# Patient Record
Sex: Female | Born: 1995 | Race: White | Hispanic: No | Marital: Married | Smoking: Never smoker
Health system: Southern US, Community
[De-identification: ages and names within clinical notes are randomized; demographics above are authoritative.]

## PROBLEM LIST (undated history)

## (undated) ENCOUNTER — Inpatient Hospital Stay (HOSPITAL_COMMUNITY): Payer: Self-pay

## (undated) ENCOUNTER — Inpatient Hospital Stay: Payer: Self-pay

## (undated) DIAGNOSIS — G90A Postural orthostatic tachycardia syndrome (POTS): Secondary | ICD-10-CM

## (undated) DIAGNOSIS — R87629 Unspecified abnormal cytological findings in specimens from vagina: Secondary | ICD-10-CM

## (undated) DIAGNOSIS — G901 Familial dysautonomia [Riley-Day]: Secondary | ICD-10-CM

## (undated) DIAGNOSIS — F419 Anxiety disorder, unspecified: Secondary | ICD-10-CM

## (undated) DIAGNOSIS — F329 Major depressive disorder, single episode, unspecified: Secondary | ICD-10-CM

## (undated) DIAGNOSIS — T7840XA Allergy, unspecified, initial encounter: Secondary | ICD-10-CM

## (undated) DIAGNOSIS — R51 Headache: Secondary | ICD-10-CM

## (undated) DIAGNOSIS — F909 Attention-deficit hyperactivity disorder, unspecified type: Secondary | ICD-10-CM

## (undated) DIAGNOSIS — D649 Anemia, unspecified: Secondary | ICD-10-CM

## (undated) DIAGNOSIS — F32A Depression, unspecified: Secondary | ICD-10-CM

## (undated) DIAGNOSIS — Q796 Ehlers-Danlos syndrome, unspecified: Secondary | ICD-10-CM

## (undated) HISTORY — DX: Anemia, unspecified: D64.9

## (undated) HISTORY — DX: Allergy, unspecified, initial encounter: T78.40XA

## (undated) HISTORY — PX: NO PAST SURGERIES: SHX2092

## (undated) HISTORY — DX: Unspecified abnormal cytological findings in specimens from vagina: R87.629

## (undated) HISTORY — DX: Headache: R51

## (undated) HISTORY — PX: WISDOM TOOTH EXTRACTION: SHX21

---
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
## 2016-05-06 ENCOUNTER — Ambulatory Visit: Payer: 59 | Admitting: Internal Medicine

## 2016-06-12 ENCOUNTER — Emergency Department: Payer: 59

## 2016-06-12 ENCOUNTER — Emergency Department
Admission: EM | Admit: 2016-06-12 | Discharge: 2016-06-12 | Disposition: A | Discharge status: Home / Self Care | Payer: 59 | Attending: Emergency Medicine | Admitting: Emergency Medicine

## 2016-06-12 DIAGNOSIS — Y92009 Unspecified place in unspecified non-institutional (private) residence as the place of occurrence of the external cause: Secondary | ICD-10-CM | POA: Insufficient documentation

## 2016-06-12 DIAGNOSIS — Y939 Activity, unspecified: Secondary | ICD-10-CM | POA: Diagnosis not present

## 2016-06-12 DIAGNOSIS — W1839XA Other fall on same level, initial encounter: Secondary | ICD-10-CM | POA: Diagnosis not present

## 2016-06-12 DIAGNOSIS — Y999 Unspecified external cause status: Secondary | ICD-10-CM | POA: Insufficient documentation

## 2016-06-12 DIAGNOSIS — S93491A Sprain of other ligament of right ankle, initial encounter: Secondary | ICD-10-CM

## 2016-06-12 DIAGNOSIS — R42 Dizziness and giddiness: Secondary | ICD-10-CM | POA: Insufficient documentation

## 2016-06-12 DIAGNOSIS — S99911A Unspecified injury of right ankle, initial encounter: Secondary | ICD-10-CM | POA: Diagnosis present

## 2016-06-12 DIAGNOSIS — R51 Headache: Secondary | ICD-10-CM

## 2016-06-12 DIAGNOSIS — I10 Essential (primary) hypertension: Secondary | ICD-10-CM | POA: Insufficient documentation

## 2016-06-12 DIAGNOSIS — R519 Headache, unspecified: Secondary | ICD-10-CM

## 2016-06-12 LAB — POC URINE PREG, ED: Preg Test, Ur: NEGATIVE

## 2016-06-12 NOTE — ED Triage Notes (Addendum)
Pt reports fell Monday night and right ankle is hurting.

## 2016-06-12 NOTE — ED Notes (Signed)
Patient ambulated to room with slight limp

## 2016-06-12 NOTE — ED Provider Notes (Signed)
[facility]-EMERGENCY DEPARTMENT Provider Note   CSN: [id number] Arrival date & time: 06/12/16  1721     History   Chief Complaint Chief Complaint  Patient presents with  . Ankle Pain    HPI Cassidy Myers is a 21 y.o. female presents to the emergency department with chief complaint of right ankle pain. Patient states she injured the ankle 4 days ago at home. Patient states she stood up from a lying position quickly, passed out and inverted her right ankle. Patient has a past medical history of dysautonomia and POTS disease, has had frequent episodes of syncope and near syncope. She has been evaluated in the emergency department, tests have been performed, she has appointment scheduled with her cardiologist. Patient states that she fell and injured her right ankle she has been ambulatory but with a significant limp. She's had significant swelling to right ankle. She denies any other injury throughout her body such as her knee, hip, back, does state that she has had a new moderate headache, 7 out of 10 since her fall. Her fall was witnessed, boyfriend is uncertain if she hit her head. She quickly came to after the fall and complained of a headache later on that day. No nausea or vomiting. She denies any chest pain, shortness of breath, dizziness. Patient states she only feels as if she is to pass out she stands quickly from a lying position. Patient has been taking Tylenol and ibuprofen for her headache and ankle pain which has been slightly alleviating the headache and ankle.   HPI  Past Medical History:  Diagnosis Date  . Allergy   . Dysautonomia   . Headache(784.0)   . Hypertension     Patient Active Problem List   Diagnosis Date Noted  . Right ankle pain 08/23/2013  . Depression 03/09/2013  . 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 Nonallopathic lesion of lumbosacral region 01/27/2013  . Subluxation of peroneal tendon of right foot 01/18/2013  . Hypermobility syndrome  01/18/2013    Past Surgical History:  Procedure Laterality Date  . NO PAST SURGERIES      OB History    No data available       Home Medications    Prior to Admission medications   Medication Sig Start Date End Date Taking? Authorizing Provider  cyanocobalamin 100 MCG tablet Take 100 mcg by mouth daily.    Historical Provider, MD  Ginger, Zingiber officinalis, (GINGER PO) Take 1 capsule by mouth daily.    Historical Provider, MD  ibuprofen (ADVIL,MOTRIN) 600 MG tablet Take 1 tablet (600 mg total) by mouth every 6 (six) hours as needed for pain. 02/01/13   Marcellina Millin, MD  Norgestimate-Ethinyl Estradiol Triphasic 0.18/0.215/0.25 MG-25 MCG tab Take 1 tablet by mouth daily. 03/29/15   Historical Provider, MD  ondansetron (ZOFRAN) 4 MG tablet Take one tablet (4 mg) one tablet by mouth with meals as needed for nausea 07/08/15   Duke Salvia, MD  sertraline (ZOLOFT) 25 MG tablet Take 25 mg by mouth daily. 03/29/15   Historical Provider, MD  Zinc Sulfate (ZINC 15 PO) Take 1 capsule by mouth daily.    Historical Provider, MD    Family History Family History  Problem Relation Age of Onset  . Cancer Mother     Cervical  . Asthma Mother   . Depression Mother   . Hyperlipidemia Father   . Depression Father   . Depression Maternal Grandmother   . Depression Maternal Grandfather   .  Depression Paternal Grandmother   . Diabetes Paternal Grandmother   . Depression Paternal Grandfather   . Diabetes Paternal Grandfather     Social History Social History  Substance Use Topics  . Smoking status: Never Smoker  . Smokeless tobacco: Never Used  . Alcohol use No     Allergies   Sulfa antibiotics   Review of Systems Review of Systems  Constitutional: Negative for activity change, chills, fatigue and fever.  HENT: Negative for congestion, sinus pressure and sore throat.   Eyes: Negative for visual disturbance.  Respiratory: Negative for cough, chest tightness and shortness of  breath.   Cardiovascular: Negative for chest pain and leg swelling.  Gastrointestinal: Negative for abdominal pain, diarrhea, nausea and vomiting.  Genitourinary: Negative for dysuria.  Musculoskeletal: Positive for arthralgias, gait problem and joint swelling.  Skin: Negative for rash.  Neurological: Positive for dizziness (not currently experiencing any symptoms, her baseline symptoms are only present after standing quickly from a sitting or lying position.) and headaches. Negative for weakness and numbness.  Hematological: Negative for adenopathy.  Psychiatric/Behavioral: Negative for agitation, behavioral problems and confusion.     Physical Exam Updated Vital Signs BP (!) 116/59 (BP Location: Left Arm)   Pulse 62   Temp 98 F (36.7 C) (Oral)   Resp 18   Ht 5' 4.5" (1.638 m)   Wt 59 kg   LMP 06/09/2016   SpO2 98%   BMI 21.97 kg/m   Physical Exam  Constitutional: She appears well-developed and well-nourished. No distress.  HENT:  Head: Normocephalic and atraumatic.  Right Ear: External ear normal.  Left Ear: External ear normal.  Eyes: Conjunctivae and EOM are normal. Pupils are equal, round, and reactive to light.  Neck: Normal range of motion. Neck supple.  Cardiovascular: Normal rate and regular rhythm.   No murmur heard. Pulmonary/Chest: Effort normal and breath sounds normal. No respiratory distress. She has no wheezes. She has no rales.  Abdominal: Soft. There is no tenderness.  Musculoskeletal: She exhibits no edema.  Examination of the cervical thoracic and lumbar spine shows no spinous process tenderness. She has full range of motion of the hips knees and left ankle. Right ankle is slightly limited range of motion with moderate swelling over the ATFL ligament. She is tender to palpation along the distal fibula and ATFL ligament. No skin breakdown noted. No edema. Chest 2+ dorsalis pedis pulse. Achilles is palpated with no signs of tear. No calcaneal tenderness. No  fifth metatarsal tenderness.  Neurological: She is alert. She displays normal reflexes. No cranial nerve deficit or sensory deficit. She exhibits normal muscle tone. Coordination normal.  Skin: Skin is warm and dry.  Psychiatric: She has a normal mood and affect.  Nursing note and vitals reviewed.    ED Treatments / Results  Labs (all labs ordered are listed, but only abnormal results are displayed) Labs Reviewed  POC URINE PREG, ED    EKG  EKG Interpretation None       Radiology Dg Ankle Complete Right  Result Date: 06/12/2016 CLINICAL DATA:  Rolled right ankle, with worsening right ankle pain, acute onset. Initial encounter. EXAM: RIGHT ANKLE - COMPLETE 3+ VIEW COMPARISON:  None. FINDINGS: There is no evidence of fracture or dislocation. The ankle mortise is intact; the interosseous space is within normal limits. No talar tilt or subluxation is seen. The joint spaces are preserved. No significant soft tissue abnormalities are seen. IMPRESSION: No evidence of fracture or dislocation. Electronically Signed   By:  Roanna RaiderJeffery  Chang M.D.   On: 06/12/2016 18:18   Ct Head Wo Contrast  Result Date: 06/12/2016 CLINICAL DATA:  Status post fall, with headache, acute onset. Initial encounter. EXAM: CT HEAD WITHOUT CONTRAST TECHNIQUE: Contiguous axial images were obtained from the base of the skull through the vertex without intravenous contrast. COMPARISON:  None. FINDINGS: Brain: No evidence of acute infarction, hemorrhage, hydrocephalus, extra-axial collection or mass lesion/mass effect. The posterior fossa, including the cerebellum, brainstem and fourth ventricle, is within normal limits. The third and lateral ventricles, and basal ganglia are unremarkable in appearance. The cerebral hemispheres are symmetric in appearance, with normal gray-white differentiation. No mass effect or midline shift is seen. Vascular: No hyperdense vessel or unexpected calcification. Skull: There is no evidence of  fracture; visualized osseous structures are unremarkable in appearance. Sinuses/Orbits: The visualized portions of the orbits are within normal limits. The paranasal sinuses and mastoid air cells are well-aerated. Other: No significant soft tissue abnormalities are seen. IMPRESSION: No evidence of traumatic intracranial injury or fracture. Electronically Signed   By: Roanna RaiderJeffery  Chang M.D.   On: 06/12/2016 18:19    Procedures Procedures (including critical care time) SPLINT APPLICATION Date/Time: 8:01 PM Authorized by: Patience MuscaGAINES, THOMAS CHRISTOPHER Consent: Verbal consent obtained. Risks and benefits: risks, benefits and alternatives were discussed Consent given by: patient Splint applied by: ED tech Location details: Right ankle   Splint type: Prefabricated ankle splint  Supplies used: Ankle stirrup prefabricated, crutches  Post-procedure: The splinted body part was neurovascularly unchanged following the procedure. Patient tolerance: Patient tolerated the procedure well with no immediate complications.     Medications Ordered in ED Medications - No data to display   Initial Impression / Assessment and Plan / ED Course  I have reviewed the triage vital signs and the nursing notes.  Pertinent labs & imaging results that were available during my care of the patient were reviewed by me and considered in my medical decision making (see chart for details).     21 year old female with past medical history of dysautonomia and pots disease. Patient fell 4 days ago after standing quickly from a lying position, inverted her right ankle and thinks she may have hit her head. She's had new headache over the last 4 days. CT of the head and x-ray of the right ankle are negative. She will continue Tylenol and ibuprofen. Chest appointment to follow-up with PCP and cardiologist . She is educated on signs and symptoms to return to the emergency department for.   Final Clinical Impressions(s) / ED Diagnoses    Final diagnoses:  Acute nonintractable headache, unspecified headache type  Sprain of anterior talofibular ligament of right ankle, initial encounter    New Prescriptions New Prescriptions   No medications on file     Evon Slackhomas C Gaines, PA-C 06/12/16 2004    Emily FilbertJonathan E Williams, MD 06/12/16 2139

## 2016-06-12 NOTE — Discharge Instructions (Signed)
Please wear ankle stirrup or other ankle support device for 6 weeks. Take Tylenol and ibuprofen as needed for pain. Use crutches as needed for ambulation for the next few days. Return to the ER for any worsening symptoms or urgent changes in her health. Follow-up with PCP in 2-3 days for recheck. Keep appointment with cardiologist.

## 2016-09-03 ENCOUNTER — Encounter: Payer: Self-pay | Admitting: Internal Medicine

## 2016-09-03 ENCOUNTER — Ambulatory Visit (INDEPENDENT_AMBULATORY_CARE_PROVIDER_SITE_OTHER): Payer: 59 | Admitting: Internal Medicine

## 2016-09-03 VITALS — BP 116/66 | HR 71 | Temp 98.0°F | Wt 140.5 lb

## 2016-09-03 DIAGNOSIS — L209 Atopic dermatitis, unspecified: Secondary | ICD-10-CM

## 2016-09-03 MED ORDER — TRIAMCINOLONE ACETONIDE 0.1 % EX CREA: 1.0000 "application " | TOPICAL_CREAM | Freq: Two times a day (BID) | CUTANEOUS | 0 refills | Status: DC

## 2016-09-03 NOTE — Patient Instructions (Signed)

## 2016-09-03 NOTE — Progress Notes (Signed)
Subjective:    Patient ID: Cassidy Myers, female    DOB: 1995/09/16, 21 y.o.   MRN: [id number]  HPI  Pt presents to the clinic today with c/o a rash. She reports she noticed this 1-2 months ago. She reports she has red spots that pop up intermittently. They can occur any where at any time. The rash is very itchy. She has tried Eucerin and eczema relief cream without any relief. She denies changes in soaps, lotions or detergents. No one around her has a similar rash.   Review of Systems      Past Medical History:  Diagnosis Date  . Allergy   . Dysautonomia   . Headache(784.0)   . Hypertension     Current Outpatient Prescriptions  Medication Sig Dispense Refill  . cyanocobalamin 100 MCG tablet Take 100 mcg by mouth daily.    . Ginger, Zingiber officinalis, (GINGER PO) Take 1 capsule by mouth daily.    Marland Kitchen ibuprofen (ADVIL,MOTRIN) 600 MG tablet Take 1 tablet (600 mg total) by mouth every 6 (six) hours as needed for pain. 30 tablet 0  . Norgestimate-Ethinyl Estradiol Triphasic 0.18/0.215/0.25 MG-25 MCG tab Take 1 tablet by mouth daily.  7  . ondansetron (ZOFRAN) 4 MG tablet Take one tablet (4 mg) one tablet by mouth with meals as needed for nausea 30 tablet 3  . sertraline (ZOLOFT) 25 MG tablet Take 25 mg by mouth daily.  3  . Zinc Sulfate (ZINC 15 PO) Take 1 capsule by mouth daily.     No current facility-administered medications for this visit.     Allergies  Allergen Reactions  . Sulfa Antibiotics Nausea And Vomiting    Family History  Problem Relation Age of Onset  . Cancer Mother     Cervical  . Asthma Mother   . Depression Mother   . Hyperlipidemia Father   . Depression Father   . Depression Maternal Grandmother   . Depression Maternal Grandfather   . Depression Paternal Grandmother   . Diabetes Paternal Grandmother   . Depression Paternal Grandfather   . Diabetes Paternal Grandfather     Social History   Social History  . Marital status: Single    Spouse  name: N/A  . Number of children: N/A  . Years of education: 9   Occupational History  . Student     Social History Main Topics  . Smoking status: Never Smoker  . Smokeless tobacco: Never Used  . Alcohol use No  . Drug use: No  . Sexual activity: Yes   Other Topics Concern  . Not on file   Social History Narrative   Regular exercise-yes   Caffeine Use-yes     Constitutional: Denies fever, malaise, fatigue, headache or abrupt weight changes.  Skin: Pt reports rash. Denies ulcercations.   No other specific complaints in a complete review of systems (except as listed in HPI above).  Objective:   Physical Exam   BP 116/66   Pulse 71   Temp 98 F (36.7 C) (Oral)   Wt 140 lb 8 oz (63.7 kg)   LMP 08/27/2016   SpO2 98%   BMI 23.74 kg/m  Wt Readings from Last 3 Encounters:  09/03/16 140 lb 8 oz (63.7 kg) (71 %, Z= 0.55)*  06/12/16 130 lb (59 kg) (56 %, Z= 0.14)*  12/30/15 130 lb (59 kg) (58 %, Z= 0.20)*   * Growth percentiles are based on CDC 2-20 Years data.    General: Appears her  stated age, in NAD. Skin: Scattered scaly lesions of various shapes and sizes noted on arms, chest, abdomen and neck.  BMET    Component Value Date/Time   NA 136 12/30/2015 2101   K 4.1 12/30/2015 2101   CL 107 12/30/2015 2101   CO2 25 12/30/2015 2101   GLUCOSE 91 12/30/2015 2101   BUN 9 12/30/2015 2101   CREATININE 0.58 12/30/2015 2101   CALCIUM 9.5 12/30/2015 2101   GFRNONAA >60 12/30/2015 2101   GFRAA >60 12/30/2015 2101    Lipid Panel  No results found for: CHOL, TRIG, HDL, CHOLHDL, VLDL, LDLCALC  CBC    Component Value Date/Time   WBC 7.2 12/30/2015 2101   RBC 4.20 12/30/2015 2101   HGB 13.0 12/30/2015 2101   HCT 38.0 12/30/2015 2101   PLT 243 12/30/2015 2101   MCV 90.5 12/30/2015 2101   MCH 31.0 12/30/2015 2101   MCHC 34.3 12/30/2015 2101   RDW 12.5 12/30/2015 2101   LYMPHSABS 1.9 01/30/2014 1108   MONOABS 0.6 01/30/2014 1108   EOSABS 0.1 01/30/2014 1108    BASOSABS 0.0 01/30/2014 1108    Hgb A1C No results found for: HGBA1C         Assessment & Plan:   Atopic Dermatitis:  eRx for Triamcinolone cream to affected areas BID Continue Eucerin cream for now If no improvement, will refer to dermatology  RTC as needed or if symptoms persist or worsen BAITY, REGINA, NP

## 2016-09-30 ENCOUNTER — Emergency Department
Admission: EM | Admit: 2016-09-30 | Discharge: 2016-09-30 | Disposition: A | Discharge status: Home / Self Care | Payer: 59 | Attending: Emergency Medicine | Admitting: Emergency Medicine

## 2016-09-30 ENCOUNTER — Encounter: Payer: Self-pay | Admitting: Emergency Medicine

## 2016-09-30 DIAGNOSIS — R197 Diarrhea, unspecified: Secondary | ICD-10-CM | POA: Diagnosis not present

## 2016-09-30 DIAGNOSIS — R55 Syncope and collapse: Secondary | ICD-10-CM | POA: Diagnosis not present

## 2016-09-30 DIAGNOSIS — R112 Nausea with vomiting, unspecified: Secondary | ICD-10-CM | POA: Insufficient documentation

## 2016-09-30 LAB — URINALYSIS, COMPLETE (UACMP) WITH MICROSCOPIC
Bacteria, UA: NONE SEEN
Bilirubin Urine: NEGATIVE
Glucose, UA: NEGATIVE mg/dL
Hgb urine dipstick: NEGATIVE
Ketones, ur: NEGATIVE mg/dL
Leukocytes, UA: NEGATIVE
Nitrite: NEGATIVE
Protein, ur: NEGATIVE mg/dL
Specific Gravity, Urine: 1.021 (ref 1.005–1.030)
pH: 6 (ref 5.0–8.0)

## 2016-09-30 LAB — COMPREHENSIVE METABOLIC PANEL
ALT: 18 U/L (ref 14–54)
AST: 22 U/L (ref 15–41)
Albumin: 4.1 g/dL (ref 3.5–5.0)
Alkaline Phosphatase: 57 U/L (ref 38–126)
Anion gap: 6 (ref 5–15)
BUN: 13 mg/dL (ref 6–20)
CO2: 27 mmol/L (ref 22–32)
Calcium: 9.2 mg/dL (ref 8.9–10.3)
Chloride: 105 mmol/L (ref 101–111)
Creatinine, Ser: 0.67 mg/dL (ref 0.44–1.00)
GFR calc Af Amer: >60 mL/min (ref >60)
GFR calc non Af Amer: >60 mL/min (ref >60)
Glucose, Bld: 99 mg/dL (ref 65–99)
Potassium: 4.1 mmol/L (ref 3.5–5.1)
Sodium: 138 mmol/L (ref 135–145)
Total Bilirubin: 0.7 mg/dL (ref 0.3–1.2)
Total Protein: 7.4 g/dL (ref 6.5–8.1)

## 2016-09-30 LAB — CBC
HCT: 38.8 % (ref 35.0–47.0)
Hemoglobin: 13.4 g/dL (ref 12.0–16.0)
MCH: 30.7 pg (ref 26.0–34.0)
MCHC: 34.5 g/dL (ref 32.0–36.0)
MCV: 89 fL (ref 80.0–100.0)
Platelets: 258 10*3/uL (ref 150–440)
RBC: 4.36 MIL/uL (ref 3.80–5.20)
RDW: 12.6 % (ref 11.5–14.5)
WBC: 7.4 10*3/uL (ref 3.6–11.0)

## 2016-09-30 LAB — CHLAMYDIA/NGC RT PCR (ARMC ONLY)
Chlamydia Tr: NOT DETECTED
N gonorrhoeae: NOT DETECTED

## 2016-09-30 LAB — WET PREP, GENITAL
Clue Cells Wet Prep HPF POC: NONE SEEN
Sperm: NONE SEEN
Trich, Wet Prep: NONE SEEN
Yeast Wet Prep HPF POC: NONE SEEN

## 2016-09-30 LAB — LIPASE, BLOOD: Lipase: 24 U/L (ref 11–51)

## 2016-09-30 LAB — POCT PREGNANCY, URINE: Preg Test, Ur: NEGATIVE

## 2016-09-30 MED ORDER — ONDANSETRON HCL 4 MG/2ML IJ SOLN
4.0000 mg | Freq: Once | INTRAMUSCULAR | Status: AC
  Administered 2016-09-30: 4 mg via INTRAVENOUS
  Filled 2016-09-30: qty 2

## 2016-09-30 MED ORDER — SODIUM CHLORIDE 0.9 % IV BOLUS (SEPSIS)
1000.0000 mL | Freq: Once | INTRAVENOUS | Status: AC
  Administered 2016-09-30: 1000 mL via INTRAVENOUS

## 2016-09-30 NOTE — ED Triage Notes (Signed)
Arrives today with c/o Nausea and Vomiting since Saturday.  Yesterday, low back started to hurt.  Patient also reports one syncopal episode on Monday.

## 2016-09-30 NOTE — ED Provider Notes (Signed)
[facility] Emergency Department Provider Note ____________________________________________   I have reviewed the triage vital signs and the triage nursing note.  HISTORY  Chief Complaint Emesis and Nausea   Historian Patient  HPI Cassidy Myers is a 21 y.o. female presenting today for nausea and several episodes of vomiting started on Saturday and ongoing. Mom and dad both had similar symptoms with diarrhea as well as fever that lasted about 24 hours,. This patient has not had a fever however symptoms are ongoing. She has had some mild diarrhea as well. No significant abdominal pain. She is having a small amount of vaginal discharge. Reports feeling lightheaded and passing out once on Monday.  Currently still feels a little bit nauseated and mild generalized weakness. She feels like she may be dehydrated.  Nothing is making it worse or better.  States it's been greater than one month without a period. Slight vaginal discharge without pelvic pain.    Past Medical History:  Diagnosis Date  . Allergy   . Dysautonomia   . Headache(784.0)   . Hypertension     Patient Active Problem List   Diagnosis Date Noted  . Depression 03/09/2013  . 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 NO PAST SURGERIES      Prior to Admission medications   Medication Sig Start Date End Date Taking? Authorizing Provider  Norgestimate-Ethinyl Estradiol Triphasic 0.18/0.215/0.25 MG-25 MCG tab Take 1 tablet by mouth daily. 03/29/15   [provider]  ondansetron (ZOFRAN) 4 MG tablet Take one tablet (4 mg) one tablet by mouth with meals as needed for nausea Patient not taking: Reported on 09/03/2016 07/08/15   Duke Salvia, MD  Probiotic Product (PROBIOTIC-10) CAPS Take by mouth.    [provider]  triamcinolone cream (KENALOG) 0.1 % Apply 1 application topically 2 (two) times daily. 09/03/16   Lorre Munroe,  NP    Allergies  Allergen Reactions  . Sulfa Antibiotics Nausea And Vomiting    Family History  Problem Relation Age of Onset  . Cancer Mother        Cervical  . Asthma Mother   . Depression Mother   . Hyperlipidemia Father   . Depression Father   . Depression Maternal Grandmother   . Depression Maternal Grandfather   . Depression Paternal Grandmother   . Diabetes Paternal Grandmother   . Depression Paternal Grandfather   . Diabetes Paternal Grandfather     Social History Social History  Substance Use Topics  . Smoking status: Never Smoker  . Smokeless tobacco: Never Used  . Alcohol use No    Review of Systems  Constitutional: Negative for fever. Eyes: Negative for visual changes. ENT: Negative for sore throat. Cardiovascular: Negative for chest pain. Respiratory: Negative for shortness of breath. Gastrointestinal: Negative for bloody stools. Genitourinary: Negative for dysuria.  Positive for mild vaginal discharge. Musculoskeletal: Negative for back pain. Skin: Negative for rash. Neurological: Negative for headache.  ____________________________________________   PHYSICAL EXAM:  VITAL SIGNS: ED Triage Vitals  Enc Vitals Group     BP 09/30/16 0942 109/65     Pulse Rate 09/30/16 0942 76     Resp 09/30/16 0942 16     Temp 09/30/16 0942 98.1 F (36.7 C)     Temp Source 09/30/16 0942 Oral     SpO2 09/30/16 0942 98 %     Weight 09/30/16 0942 145 lb (65.8 kg)     Height 09/30/16  1610 5\' 4"  (1.626 m)     Head Circumference --      Peak Flow --      Pain Score 09/30/16 0941 5     Pain Loc --      Pain Edu? --      Excl. in GC? --      Constitutional: Alert and oriented. Well appearing and in no distress. HEENT   Head: Normocephalic and atraumatic.      Eyes: Conjunctivae are normal. PER.      Ears:         Nose: No congestion/rhinnorhea.   Mouth/Throat: Mucous membranes are moist.   Neck: No stridor. Cardiovascular/Chest: Normal rate,  regular rhythm.  No murmurs, rubs, or gallops. Respiratory: Normal respiratory effort without tachypnea nor retractions. Breath sounds are clear and equal bilaterally. No wheezes/rales/rhonchi. Gastrointestinal: Soft. No distention, no guarding, no rebound. Nontender.  Genitourinary/rectal: Small amount of vaginal discharge, weight, no cervicitis or adnexal tenderness on bimanual exam. Musculoskeletal: Nontender with normal range of motion in all extremities. No joint effusions.  No lower extremity tenderness.  No edema. Neurologic:  Normal speech and language. No gross or focal neurologic deficits are appreciated. Skin:  Skin is warm, dry and intact. No rash noted. Psychiatric: Mood and affect are normal. Speech and behavior are normal. Patient exhibits appropriate insight and judgment.   ____________________________________________  LABS (pertinent positives/negatives)  Labs Reviewed  URINALYSIS, COMPLETE (UACMP) WITH MICROSCOPIC - Abnormal; Notable for the following:       Result Value   Color, Urine YELLOW (*)    APPearance HAZY (*)    Squamous Epithelial / LPF 6-30 (*)    All other components within normal limits  CHLAMYDIA/NGC RT PCR ([facility] ONLY)  WET PREP, GENITAL  LIPASE, BLOOD  COMPREHENSIVE METABOLIC PANEL  CBC  POC URINE PREG, ED  POCT PREGNANCY, URINE    ____________________________________________    EKG I, Governor Rooks, MD, the attending physician have personally viewed and interpreted all ECGs.  56 bpm. Normal sinus rhythm. Narrow QRS. Normal axis. Normal ST and T-wave ____________________________________________  RADIOLOGY All Xrays were viewed by me. Imaging interpreted by Radiologist.  None __________________________________________  PROCEDURES  Procedure(s) performed: None  Critical Care performed: None  ____________________________________________   ED COURSE / ASSESSMENT AND PLAN  Pertinent labs & imaging results that were  available during my care of the patient were reviewed by me and considered in my medical decision making (see chart for details).   Ms. Rhodes is overall well-appearing with stable vital signs. Abdomen is soft and nontender. She still reporting feeling weak and nauseated, and I did discuss IV fluid and nausea medication for symptomatic relief.  Laboratory studies are reassuring.  She's had a mild vaginal discharge and we discussed proceeding with pelvic exam, no sign of PID on exam or cervicitis.  I'm most suspicious of likely viral intestinal illness.      CONSULTATIONS:   None   Patient / Family / Caregiver informed of clinical course, medical decision-making process, and agree with plan.   I discussed return precautions, follow-up instructions, and discharge instructions with patient and/or family.  Discharge instructions:  You were evaluated for nausea, vomiting, diarrhea, and although no certain cause was found, your exam and evaluation are reassuring in the emergency department today.  Return to emergency department immediately for any worsening vomiting including vomiting blood, concern for dehydration such as not making urine are dry mouth, fever, black or bloody stools, abdominal pain, or any other  symptoms concerning to you.  ___________________________________________   FINAL CLINICAL IMPRESSION(S) / ED DIAGNOSES   Final diagnoses:  Nausea vomiting and diarrhea  Syncope, unspecified syncope type              Note: This dictation was prepared with Dragon dictation. Any transcriptional errors that result from this process are unintentional    Governor RooksLord, Rebecca, MD 09/30/16 1318

## 2016-09-30 NOTE — ED Notes (Signed)
Pt alert and oriented X4, active, cooperative, pt in NAD. RR even and unlabored, color WNL.  Pt informed to return if any life threatening symptoms occur.   

## 2016-09-30 NOTE — ED Notes (Signed)
RLQ and LLQ abdominal pain X 2 days. Vaginal discharge X 1 month.

## 2016-09-30 NOTE — Discharge Instructions (Addendum)
You were evaluated for nausea, vomiting, diarrhea, and although no certain cause was found, your exam and evaluation are reassuring in the emergency department today.  Return to emergency department immediately for any worsening vomiting including vomiting blood, concern for dehydration such as not making urine are dry mouth, fever, black or bloody stools, abdominal pain, or any other symptoms concerning to you.

## 2017-03-10 ENCOUNTER — Encounter: Payer: Self-pay | Admitting: Family Medicine

## 2017-03-10 ENCOUNTER — Ambulatory Visit (INDEPENDENT_AMBULATORY_CARE_PROVIDER_SITE_OTHER): Payer: 59 | Admitting: Family Medicine

## 2017-03-10 VITALS — BP 106/70 | HR 105 | Temp 98.3°F | Ht 64.86 in | Wt 154.8 lb

## 2017-03-10 DIAGNOSIS — Z3201 Encounter for pregnancy test, result positive: Secondary | ICD-10-CM | POA: Diagnosis not present

## 2017-03-10 DIAGNOSIS — N926 Irregular menstruation, unspecified: Secondary | ICD-10-CM

## 2017-03-10 LAB — POCT URINE PREGNANCY: Preg Test, Ur: POSITIVE — AB

## 2017-03-10 NOTE — Patient Instructions (Addendum)
Start a prenatal vitamin right away   First Trimester of Pregnancy The first trimester of pregnancy is from week 1 until the end of week 13 (months 1 through 3). A week after a sperm fertilizes an egg, the egg will implant on the wall of the uterus. This embryo will begin to develop into a baby. Genes from you and your partner will form the baby. The female genes will determine whether the baby will be a boy or a girl. At 6-8 weeks, the eyes and face will be formed, and the heartbeat can be seen on ultrasound. At the end of 12 weeks, all the baby's organs will be formed. Now that you are pregnant, you will want to do everything you can to have a healthy baby. Two of the most important things are to get good prenatal care and to follow your health care provider's instructions. Prenatal care is all the medical care you receive before the baby's birth. This care will help prevent, find, and treat any problems during the pregnancy and childbirth. Body changes during your first trimester Your body goes through many changes during pregnancy. The changes vary from woman to woman.  You may gain or lose a couple of pounds at first.  You may feel sick to your stomach (nauseous) and you may throw up (vomit). If the vomiting is uncontrollable, call your health care provider.  You may tire easily.  You may develop headaches that can be relieved by medicines. All medicines should be approved by your health care provider.  You may urinate more often. Painful urination may mean you have a bladder infection.  You may develop heartburn as a result of your pregnancy.  You may develop constipation because certain hormones are causing the muscles that push stool through your intestines to slow down.  You may develop hemorrhoids or swollen veins (varicose veins).  Your breasts may begin to grow larger and become tender. Your nipples may stick out more, and the tissue that surrounds them (areola) may become  darker.  Your gums may bleed and may be sensitive to brushing and flossing.  Dark spots or blotches (chloasma, mask of pregnancy) may develop on your face. This will likely fade after the baby is born.  Your menstrual periods will stop.  You may have a loss of appetite.  You may develop cravings for certain kinds of food.  You may have changes in your emotions from day to day, such as being excited to be pregnant or being concerned that something may go wrong with the pregnancy and baby.  You may have more vivid and strange dreams.  You may have changes in your hair. These can include thickening of your hair, rapid growth, and changes in texture. Some women also have hair loss during or after pregnancy, or hair that feels dry or thin. Your hair will most likely return to normal after your baby is born.  What to expect at prenatal visits During a routine prenatal visit:  You will be weighed to make sure you and the baby are growing normally.  Your blood pressure will be taken.  Your abdomen will be measured to track your baby's growth.  The fetal heartbeat will be listened to between weeks 10 and 14 of your pregnancy.  Test results from any previous visits will be discussed.  Your health care provider may ask you:  How you are feeling.  If you are feeling the baby move.  If you have had any abnormal  symptoms, such as leaking fluid, bleeding, severe headaches, or abdominal cramping.  If you are using any tobacco products, including cigarettes, chewing tobacco, and electronic cigarettes.  If you have any questions.  Other tests that may be performed during your first trimester include:  Blood tests to find your blood type and to check for the presence of any previous infections. The tests will also be used to check for low iron levels (anemia) and protein on red blood cells (Rh antibodies). Depending on your risk factors, or if you previously had diabetes during pregnancy,  you may have tests to check for high blood sugar that affects pregnant women (gestational diabetes).  Urine tests to check for infections, diabetes, or protein in the urine.  An ultrasound to confirm the proper growth and development of the baby.  Fetal screens for spinal cord problems (spina bifida) and Down syndrome.  HIV (human immunodeficiency virus) testing. Routine prenatal testing includes screening for HIV, unless you choose not to have this test.  You may need other tests to make sure you and the baby are doing well.  Follow these instructions at home: Medicines  Follow your health care provider's instructions regarding medicine use. Specific medicines may be either safe or unsafe to take during pregnancy.  Take a prenatal vitamin that contains at least 600 micrograms (mcg) of folic acid.  If you develop constipation, try taking a stool softener if your health care provider approves. Eating and drinking  Eat a balanced diet that includes fresh fruits and vegetables, whole grains, good sources of protein such as meat, eggs, or tofu, and low-fat dairy. Your health care provider will help you determine the amount of weight gain that is right for you.  Avoid raw meat and uncooked cheese. These carry germs that can cause birth defects in the baby.  Eating four or five small meals rather than three large meals a day may help relieve nausea and vomiting. If you start to feel nauseous, eating a few soda crackers can be helpful. Drinking liquids between meals, instead of during meals, also seems to help ease nausea and vomiting.  Limit foods that are high in fat and processed sugars, such as fried and sweet foods.  To prevent constipation: ? Eat foods that are high in fiber, such as fresh fruits and vegetables, whole grains, and beans. ? Drink enough fluid to keep your urine clear or pale yellow. Activity  Exercise only as directed by your health care provider. Most women can  continue their usual exercise routine during pregnancy. Try to exercise for 30 minutes at least 5 days a week. Exercising will help you: ? Control your weight. ? Stay in shape. ? Be prepared for labor and delivery.  Experiencing pain or cramping in the lower abdomen or lower back is a good sign that you should stop exercising. Check with your health care provider before continuing with normal exercises.  Try to avoid standing for long periods of time. Move your legs often if you must stand in one place for a long time.  Avoid heavy lifting.  Wear low-heeled shoes and practice good posture.  You may continue to have sex unless your health care provider tells you not to. Relieving pain and discomfort  Wear a good support bra to relieve breast tenderness.  Take warm sitz baths to soothe any pain or discomfort caused by hemorrhoids. Use hemorrhoid cream if your health care provider approves.  Rest with your legs elevated if you have leg cramps  or low back pain.  If you develop varicose veins in your legs, wear support hose. Elevate your feet for 15 minutes, 3-4 times a day. Limit salt in your diet. Prenatal care  Schedule your prenatal visits by the twelfth week of pregnancy. They are usually scheduled monthly at first, then more often in the last 2 months before delivery.  Write down your questions. Take them to your prenatal visits.  Keep all your prenatal visits as told by your health care provider. This is important. Safety  Wear your seat belt at all times when driving.  Make a list of emergency phone numbers, including numbers for family, friends, the hospital, and police and fire departments. General instructions  Ask your health care provider for a referral to a local prenatal education class. Begin classes no later than the beginning of month 6 of your pregnancy.  Ask for help if you have counseling or nutritional needs during pregnancy. Your health care provider can offer  advice or refer you to specialists for help with various needs.  Do not use hot tubs, steam rooms, or saunas.  Do not douche or use tampons or scented sanitary pads.  Do not cross your legs for long periods of time.  Avoid cat litter boxes and soil used by cats. These carry germs that can cause birth defects in the baby and possibly loss of the fetus by miscarriage or stillbirth.  Avoid all smoking, herbs, alcohol, and medicines not prescribed by your health care provider. Chemicals in these products affect the formation and growth of the baby.  Do not use any products that contain nicotine or tobacco, such as cigarettes and e-cigarettes. If you need help quitting, ask your health care provider. You may receive counseling support and other resources to help you quit.  Schedule a dentist appointment. At home, brush your teeth with a soft toothbrush and be gentle when you floss. Contact a health care provider if:  You have dizziness.  You have mild pelvic cramps, pelvic pressure, or nagging pain in the abdominal area.  You have persistent nausea, vomiting, or diarrhea.  You have a bad smelling vaginal discharge.  You have pain when you urinate.  You notice increased swelling in your face, hands, legs, or ankles.  You are exposed to fifth disease or chickenpox.  You are exposed to [place] measles (rubella) and have never had it. Get help right away if:  You have a fever.  You are leaking fluid from your vagina.  You have spotting or bleeding from your vagina.  You have severe abdominal cramping or pain.  You have rapid weight gain or loss.  You vomit blood or material that looks like coffee grounds.  You develop a severe headache.  You have shortness of breath.  You have any kind of trauma, such as from a fall or a car accident. Summary  The first trimester of pregnancy is from week 1 until the end of week 13 (months 1 through 3).  Your body goes through many  changes during pregnancy. The changes vary from woman to woman.  You will have routine prenatal visits. During those visits, your health care provider will examine you, discuss any test results you may have, and talk with you about how you are feeling. This information is not intended to replace advice given to you by your health care provider. Make sure you discuss any questions you have with your health care provider. Document Released: 04/28/2001 Document Revised: 04/15/2016 Document Reviewed:  04/15/2016 Elsevier Interactive Patient Education  2017 [employer].

## 2017-03-10 NOTE — Progress Notes (Signed)
   Subjective:    Patient ID: Cassidy Myers, female    DOB: 07/26/1995, 21 y.o.   MRN: [id number]  HPI This is a 21 yo who presents today requesting a pregnancy test. She has had 4 positive tests at home. Having some nausea and breast tenderness. Patient's last menstrual period was 01/31/2017. She denies alcohol or drug use. Not taking any medications. Has ob/gyn.     Past Medical History:  Diagnosis Date  . Allergy   . Dysautonomia (HCC)   . Headache(784.0)   . Hypertension    Past Surgical History:  Procedure Laterality Date  . NO PAST SURGERIES     Family History  Problem Relation Age of Onset  . Cancer Mother        Cervical  . Asthma Mother   . Depression Mother   . Hyperlipidemia Father   . Depression Father   . Depression Maternal Grandmother   . Depression Maternal Grandfather   . Depression Paternal Grandmother   . Diabetes Paternal Grandmother   . Depression Paternal Grandfather   . Diabetes Paternal Grandfather       Review of Systems Per HPI    Objective:   Physical Exam Physical Exam  Vitals reviewed. Constitutional: Oriented to person, place, and time. Appears well-developed and well-nourished.  HENT:  Head: Normocephalic and atraumatic.  Eyes: Conjunctivae are normal.  Neck: Normal range of motion. Neck supple.  Cardiovascular: Normal rate.   Pulmonary/Chest: Effort normal.  Musculoskeletal: Normal range of motion.  Neurological: Alert and oriented to person, place, and time.  Skin: Skin is warm and dry.  Psychiatric: Normal mood and affect. Behavior is normal. Judgment and thought content normal.   BP 106/70 (BP Location: Right Arm, Patient Position: Sitting, Cuff Size: Normal)   Pulse (!) 105   Temp 98.3 F (36.8 C) (Oral)   Ht 5' 4.86" (1.647 m)   Wt 154 lb 12.8 oz (70.2 kg)   LMP 01/31/2017   SpO2 99%   BMI 25.87 kg/m      Results for orders placed or performed in visit on 03/10/17  POCT urine pregnancy  Result Value Ref Range    Preg Test, Ur Positive (A) Negative    Assessment & Plan:  1. Missed period - POCT urine pregnancy  2. Positive pregnancy test - provided written information regarding first trimester of pregnancy - encouraged her to start prenatal vitamin - discussed importance of abstaining from alcohol, drugs - she is established with ob/gyn and will make an appointment   Olean Reeeborah Kylani Wires, FNP-BC  [facility], [facility]  03/10/2017 5:11 PM

## 2017-03-12 ENCOUNTER — Ambulatory Visit: Payer: 59 | Admitting: Internal Medicine

## 2017-03-19 LAB — OB RESULTS CONSOLE RUBELLA ANTIBODY, IGM: Rubella: IMMUNE

## 2017-03-19 LAB — OB RESULTS CONSOLE ANTIBODY SCREEN: Antibody Screen: NEGATIVE

## 2017-03-19 LAB — OB RESULTS CONSOLE HIV ANTIBODY (ROUTINE TESTING): HIV: NONREACTIVE

## 2017-03-19 LAB — OB RESULTS CONSOLE RPR: RPR: NONREACTIVE

## 2017-03-19 LAB — OB RESULTS CONSOLE GC/CHLAMYDIA
Chlamydia: NEGATIVE
Gonorrhea: NEGATIVE

## 2017-03-19 LAB — OB RESULTS CONSOLE ABO/RH: RH Type: NEGATIVE

## 2017-03-19 LAB — OB RESULTS CONSOLE HEPATITIS B SURFACE ANTIGEN: Hepatitis B Surface Ag: NEGATIVE

## 2017-04-18 ENCOUNTER — Emergency Department
Admission: EM | Admit: 2017-04-18 | Discharge: 2017-04-18 | Disposition: A | Discharge status: Home / Self Care | Payer: 59 | Attending: Emergency Medicine | Admitting: Emergency Medicine

## 2017-04-18 ENCOUNTER — Other Ambulatory Visit: Payer: Self-pay

## 2017-04-18 ENCOUNTER — Encounter: Payer: Self-pay | Admitting: Emergency Medicine

## 2017-04-18 DIAGNOSIS — O99519 Diseases of the respiratory system complicating pregnancy, unspecified trimester: Secondary | ICD-10-CM | POA: Diagnosis present

## 2017-04-18 DIAGNOSIS — Z3A11 11 weeks gestation of pregnancy: Secondary | ICD-10-CM | POA: Insufficient documentation

## 2017-04-18 DIAGNOSIS — R42 Dizziness and giddiness: Secondary | ICD-10-CM | POA: Insufficient documentation

## 2017-04-18 DIAGNOSIS — O9989 Other specified diseases and conditions complicating pregnancy, childbirth and the puerperium: Secondary | ICD-10-CM | POA: Insufficient documentation

## 2017-04-18 DIAGNOSIS — I10 Essential (primary) hypertension: Secondary | ICD-10-CM | POA: Diagnosis not present

## 2017-04-18 DIAGNOSIS — J069 Acute upper respiratory infection, unspecified: Secondary | ICD-10-CM

## 2017-04-18 LAB — URINALYSIS, COMPLETE (UACMP) WITH MICROSCOPIC
Bilirubin Urine: NEGATIVE
Glucose, UA: NEGATIVE mg/dL
Hgb urine dipstick: NEGATIVE
Ketones, ur: 5 mg/dL — AB
Leukocytes, UA: NEGATIVE
Nitrite: NEGATIVE
Protein, ur: NEGATIVE mg/dL
RBC / HPF: NONE SEEN RBC/hpf (ref 0–5)
Specific Gravity, Urine: 1.012 (ref 1.005–1.030)
pH: 8 (ref 5.0–8.0)

## 2017-04-18 LAB — CBC WITH DIFFERENTIAL/PLATELET
Basophils Absolute: 0 10*3/uL (ref 0–0.1)
Basophils Relative: 0 %
Eosinophils Absolute: 0 10*3/uL (ref 0–0.7)
Eosinophils Relative: 0 %
HCT: 35.5 % (ref 35.0–47.0)
Hemoglobin: 12.2 g/dL (ref 12.0–16.0)
Lymphocytes Relative: 22 %
Lymphs Abs: 1.8 10*3/uL (ref 1.0–3.6)
MCH: 30.8 pg (ref 26.0–34.0)
MCHC: 34.3 g/dL (ref 32.0–36.0)
MCV: 90 fL (ref 80.0–100.0)
Monocytes Absolute: 0.9 10*3/uL (ref 0.2–0.9)
Monocytes Relative: 11 %
Neutro Abs: 5.3 10*3/uL (ref 1.4–6.5)
Neutrophils Relative %: 67 %
Platelets: 251 10*3/uL (ref 150–440)
RBC: 3.95 MIL/uL (ref 3.80–5.20)
RDW: 12.9 % (ref 11.5–14.5)
WBC: 8 10*3/uL (ref 3.6–11.0)

## 2017-04-18 LAB — BASIC METABOLIC PANEL
Anion gap: 8 (ref 5–15)
BUN: 7 mg/dL (ref 6–20)
CO2: 21 mmol/L — ABNORMAL LOW (ref 22–32)
Calcium: 9.3 mg/dL (ref 8.9–10.3)
Chloride: 105 mmol/L (ref 101–111)
Creatinine, Ser: 0.51 mg/dL (ref 0.44–1.00)
GFR calc Af Amer: >60 mL/min (ref >60)
GFR calc non Af Amer: >60 mL/min (ref >60)
Glucose, Bld: 82 mg/dL (ref 65–99)
Potassium: 3.8 mmol/L (ref 3.5–5.1)
Sodium: 134 mmol/L — ABNORMAL LOW (ref 135–145)

## 2017-04-18 LAB — POCT PREGNANCY, URINE: Preg Test, Ur: POSITIVE — AB

## 2017-04-18 LAB — PREGNANCY, URINE: Preg Test, Ur: POSITIVE — AB

## 2017-04-18 MED ORDER — SODIUM CHLORIDE 0.9 % IV BOLUS (SEPSIS)
1000.0000 mL | Freq: Once | INTRAVENOUS | Status: AC
  Administered 2017-04-18: 1000 mL via INTRAVENOUS

## 2017-04-18 NOTE — ED Triage Notes (Signed)
Pt states cold symptoms x 7 days and last night at work dizzy with near syncope. Took tylenol 1g 10am.

## 2017-04-18 NOTE — ED Notes (Signed)
First Nurse Note: Pt states that she is approximately 11 or [redacted] weeks pregnant. Pt has been feeling dizzy and states that she fainted last night but was able to catch herself before she hit the ground. Pt states that she has also had cold symptoms for the past few weeks.

## 2017-04-18 NOTE — ED Provider Notes (Signed)
[facility] Emergency Department Provider Note  ____________________________________________   I have reviewed the triage vital signs and the nursing notes.   HISTORY  Chief Complaint URI and Headache    HPI Cassidy Myers is a 21 y.o. 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  She had a fever a week ago which has gone away.  She has had no gush of fluid, she states that she is not been eating or drinking very much because she does not feel very well and today she was lightheaded but did not pass out.  This is known for her.  She has a normal low blood pressure, and she often has symptoms of the postural orthostatic lightheadedness.  Patient states that she has not had significant vomiting.  She denies any significant cough although she has had some coughing.  She denies any significant headache although she has had some headache etc has had no exertional syndromes and most of her symptoms happen when she stands up too quickly, she has had no family history of early cardiac death or sudden cardiac death..   Past Medical History:  Diagnosis Date  . Allergy   . Dysautonomia (HCC)   . Headache(784.0)   . Hypertension     Patient Active Problem List   Diagnosis Date Noted  . Depression 03/09/2013  . 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 NO PAST SURGERIES      Prior to Admission medications   Not on File    Allergies Sulfa antibiotics  Family History  Problem Relation Age of Onset  . Cancer Mother        Cervical  . Asthma Mother   . Depression Mother   . Hyperlipidemia Father   . Depression Father   . Depression Maternal Grandmother   . Depression Maternal Grandfather   . Depression Paternal  Grandmother   . Diabetes Paternal Grandmother   . Depression Paternal Grandfather   . Diabetes Paternal Grandfather     Social History Social History   Tobacco Use  . Smoking status: Never Smoker  . Smokeless tobacco: Never Used  Substance Use Topics  . Alcohol use: No  . Drug use: No    Review of Systems Constitutional: No fever/chill since 4 days ago Eyes: No visual changes. ENT: No sore throat. No stiff neck no neck pain Cardiovascular: Denies chest pain. Respiratory: Denies shortness of breath.  Positive cough Gastrointestinal:   no vomiting.  No diarrhea.  No constipation. Genitourinary: Negative for dysuria. Musculoskeletal: Negative lower extremity swelling Skin: Negative for rash. Neurological: Negative for severe headaches, focal weakness or numbness.   ____________________________________________   PHYSICAL EXAM:  VITAL SIGNS: ED Triage Vitals  Enc Vitals Group     BP 04/18/17 1204 110/67     Pulse Rate 04/18/17 1204 79     Resp 04/18/17 1204 16     Temp 04/18/17 1204 97.9 F (36.6 C)     Temp Source 04/18/17 1204 Oral     SpO2 04/18/17 1204 100 %     Weight 04/18/17 1205 155 lb (70.3 kg)     Height 04/18/17 1205 5' 4.5" (1.638 m)     Head Circumference --      Peak Flow --  Pain Score 04/18/17 1204 4     Pain Loc --      Pain Edu? --      Excl. in GC? --     Constitutional: Alert and oriented. Well appearing and in no acute distress. Eyes: Conjunctivae are normal Head: Atraumatic HEENT: Mild clear congestion/rhinnorhea. Mucous membranes are moist.  Oropharynx non-erythematous Neck:   Nontender with no meningismus, no masses, no stridor Cardiovascular: Normal rate, regular rhythm. Grossly normal heart sounds.  Good peripheral circulation. Respiratory: Normal respiratory effort.  No retractions. Lungs CTAB. Abdominal: Soft and nontender. No distention. No guarding no rebound Back:  There is no focal tenderness or step off.  there is no  midline tenderness there are no lesions noted. there is no CVA tenderness Bedside ultrasound shows single live IUP with fetal heart tones in the 140s Musculoskeletal: No lower extremity tenderness, no upper extremity tenderness. No joint effusions, no DVT signs strong distal pulses no edema Neurologic:  Normal speech and language. No gross focal neurologic deficits are appreciated.  Skin:  Skin is warm, dry and intact. No rash noted. Psychiatric: Mood and affect are normal. Speech and behavior are normal.  ____________________________________________   LABS (all labs ordered are listed, but only abnormal results are displayed)  Labs Reviewed  BASIC METABOLIC PANEL - Abnormal; Notable for the following components:      Result Value   Sodium 134 (*)    CO2 21 (*)    All other components within normal limits  POCT PREGNANCY, URINE - Abnormal; Notable for the following components:   Preg Test, Ur POSITIVE (*)    All other components within normal limits  CBC WITH DIFFERENTIAL/PLATELET  URINALYSIS, COMPLETE (UACMP) WITH MICROSCOPIC  PREGNANCY, URINE    Pertinent labs  results that were available during my care of the patient were reviewed by me and considered in my medical decision making (see chart for details). ____________________________________________  EKG  I personally interpreted any EKGs ordered by me or triage Sinus rhythm, rate 54 bpm, bradycardia noted.  Normal axis no acute ST elevation or depression, QTC is 187 ____________________________________________  RADIOLOGY  Pertinent labs & imaging results that were available during my care of the patient were reviewed by me and considered in my medical decision making (see chart for details). If possible, patient and/or family made aware of any abnormal findings.  No results found. ____________________________________________    PROCEDURES  Procedure(s) performed: None  Procedures  Critical Care performed:  None  ____________________________________________   INITIAL IMPRESSION / ASSESSMENT AND PLAN / ED COURSE  Pertinent labs & imaging results that were available during my care of the patient were reviewed by me and considered in my medical decision making (see chart for details).  Here because she felt dehydrated and has URI symptoms.  We gave her IV fluid and she feels 100% better.  This is a common and recurrent event for this patient.  Low suspicion for PE no chest pain, no shortness of breath we discussed chest x-ray for her cough but at this time I have clinically low suspicion for pneumonia and patient would prefer not to have that done given radiation exposure.  The patient is absolutely asymptomatic after IV fluid which is a not unusual for her given her history and she would like to go home.  I encouraged extensive hydration.  Nothing to suggest ACS PE or dissection or other acute intra-abdominal or intrathoracic pathology.  No evidence of complication with the pregnancy.  ____________________________________________   FINAL CLINICAL IMPRESSION(S) / ED DIAGNOSES  Final diagnoses:  None      This chart was dictated using voice recognition software.  Despite best efforts to proofread,  errors can occur which can change meaning.      Jeanmarie PlantMcShane, Lakeya Mulka A, MD 04/18/17 [phone]

## 2017-04-18 NOTE — Discharge Instructions (Signed)
Continue drinking a great amount of fluid if you have abdominal pain, fever, vomiting, chest pain, shortness of breath, you pass out, you feel worse in any way including seizure activity return immediately to the emergency department.  Follow closely with your OB/GYN and PCP in the next day or 2

## 2017-06-10 ENCOUNTER — Emergency Department: Payer: 59

## 2017-06-10 ENCOUNTER — Emergency Department
Admission: EM | Admit: 2017-06-10 | Discharge: 2017-06-10 | Disposition: A | Discharge status: Home / Self Care | Payer: 59 | Attending: Emergency Medicine | Admitting: Emergency Medicine

## 2017-06-10 ENCOUNTER — Other Ambulatory Visit: Payer: Self-pay

## 2017-06-10 DIAGNOSIS — J029 Acute pharyngitis, unspecified: Secondary | ICD-10-CM

## 2017-06-10 DIAGNOSIS — O26812 Pregnancy related exhaustion and fatigue, second trimester: Secondary | ICD-10-CM | POA: Insufficient documentation

## 2017-06-10 DIAGNOSIS — O98512 Other viral diseases complicating pregnancy, second trimester: Secondary | ICD-10-CM | POA: Diagnosis present

## 2017-06-10 DIAGNOSIS — O2692 Pregnancy related conditions, unspecified, second trimester: Secondary | ICD-10-CM | POA: Diagnosis not present

## 2017-06-10 DIAGNOSIS — R509 Fever, unspecified: Secondary | ICD-10-CM | POA: Insufficient documentation

## 2017-06-10 DIAGNOSIS — G901 Familial dysautonomia [Riley-Day]: Secondary | ICD-10-CM | POA: Insufficient documentation

## 2017-06-10 DIAGNOSIS — Z3A18 18 weeks gestation of pregnancy: Secondary | ICD-10-CM | POA: Insufficient documentation

## 2017-06-10 DIAGNOSIS — R55 Syncope and collapse: Secondary | ICD-10-CM | POA: Diagnosis not present

## 2017-06-10 DIAGNOSIS — Z8669 Personal history of other diseases of the nervous system and sense organs: Secondary | ICD-10-CM | POA: Diagnosis not present

## 2017-06-10 LAB — URINALYSIS, COMPLETE (UACMP) WITH MICROSCOPIC
Bilirubin Urine: NEGATIVE
Glucose, UA: NEGATIVE mg/dL
Hgb urine dipstick: NEGATIVE
Ketones, ur: NEGATIVE mg/dL
Leukocytes, UA: NEGATIVE
Nitrite: NEGATIVE
Protein, ur: NEGATIVE mg/dL
Specific Gravity, Urine: 1.016 (ref 1.005–1.030)
WBC, UA: NONE SEEN WBC/hpf (ref 0–5)
pH: 8 (ref 5.0–8.0)

## 2017-06-10 LAB — CBC
HCT: 32.1 % — ABNORMAL LOW (ref 35.0–47.0)
Hemoglobin: 11.4 g/dL — ABNORMAL LOW (ref 12.0–16.0)
MCH: 32.4 pg (ref 26.0–34.0)
MCHC: 35.6 g/dL (ref 32.0–36.0)
MCV: 91 fL (ref 80.0–100.0)
Platelets: 209 10*3/uL (ref 150–440)
RBC: 3.52 MIL/uL — ABNORMAL LOW (ref 3.80–5.20)
RDW: 13.7 % (ref 11.5–14.5)
WBC: 6.2 10*3/uL (ref 3.6–11.0)

## 2017-06-10 LAB — COMPREHENSIVE METABOLIC PANEL
ALT: 17 U/L (ref 14–54)
AST: 22 U/L (ref 15–41)
Albumin: 3.5 g/dL (ref 3.5–5.0)
Alkaline Phosphatase: 38 U/L (ref 38–126)
Anion gap: 7 (ref 5–15)
BUN: <5 mg/dL — ABNORMAL LOW (ref 6–20)
CO2: 21 mmol/L — ABNORMAL LOW (ref 22–32)
Calcium: 8.5 mg/dL — ABNORMAL LOW (ref 8.9–10.3)
Chloride: 107 mmol/L (ref 101–111)
Creatinine, Ser: 0.47 mg/dL (ref 0.44–1.00)
GFR calc Af Amer: >60 mL/min (ref >60)
GFR calc non Af Amer: >60 mL/min (ref >60)
Glucose, Bld: 78 mg/dL (ref 65–99)
Potassium: 3.6 mmol/L (ref 3.5–5.1)
Sodium: 135 mmol/L (ref 135–145)
Total Bilirubin: 1.1 mg/dL (ref 0.3–1.2)
Total Protein: 6.5 g/dL (ref 6.5–8.1)

## 2017-06-10 LAB — INFLUENZA PANEL BY PCR (TYPE A & B)
Influenza A By PCR: NEGATIVE
Influenza B By PCR: NEGATIVE

## 2017-06-10 LAB — GROUP A STREP BY PCR: Group A Strep by PCR: NOT DETECTED

## 2017-06-10 MED ORDER — SODIUM CHLORIDE 0.9 % IV BOLUS (SEPSIS)
1000.0000 mL | Freq: Once | INTRAVENOUS | Status: AC
  Administered 2017-06-10: 1000 mL via INTRAVENOUS

## 2017-06-10 MED ORDER — ACETAMINOPHEN 325 MG PO TABS
650.0000 mg | ORAL_TABLET | Freq: Once | ORAL | Status: AC
  Administered 2017-06-10: 650 mg via ORAL

## 2017-06-10 MED ORDER — ACETAMINOPHEN 325 MG PO TABS
ORAL_TABLET | ORAL | Status: AC
  Filled 2017-06-10: qty 2

## 2017-06-10 NOTE — ED Triage Notes (Signed)
Pt states fever since this AM, sore throat. States "I blacked out at work yesterday. I didn't fall, I caught myself." [redacted] weeks pregnant. Unsure of temp at home. Took tylenol around 1:30pm. Alert, oriented, ambulatory.

## 2017-06-10 NOTE — ED Provider Notes (Signed)
ED ECG REPORT I, Arelia Longestavid M Schaevitz, the attending physician, personally viewed and interpreted this ECG.   Date: 06/10/2017  EKG Time: 1902  Rate: 89  Rhythm: normal sinus rhythm  Axis: Normal  Intervals:none  ST&T Change: No ST segment elevation or depression.  T wave inversion in lead III as well as V2.  Possibly lead positioning explaining the V2 inversion.  Also biphasic T wave in V3. V2 inversion and appears persistent from December 30, 2015.  Almost identical appearance of today's EKG with EKG of April 26, 2015.   Myrna BlazerSchaevitz, David Matthew, MD 06/10/17 [phone]

## 2017-06-10 NOTE — ED Notes (Signed)
Denies any spotting. States some cramping, generalized pain. Sees [facility].

## 2017-06-10 NOTE — ED Provider Notes (Signed)
[facility] Emergency Department Provider Note  ____________________________________________  Time seen: Approximately 7:41 PM  I have reviewed the triage vital signs and the nursing notes.   HISTORY  Chief Complaint Fever    HPI Cassidy Myers is a 22 y.o. female that presents to the emergency department for evaluation of sore throat and subjective fever for 1 day.  Patient had an episode yesterday where she blacked out and started to fall but caught herself.  Before blacking out, she felt warm and saw spots in her vision.  She has dysautonomia and low blood pressure and these episodes happen regularly and are not new.  She helps prevent this by adding salt to her food.  She also had some mild lower bilateral abdominal cramping this morning.  She is [redacted] weeks pregnant.  She has an appointment with her OB at the beginning of March.  She took Tylenol this afternoon for fever and sore throat.  She denies nasal congestion, cough, shortness of breath, chest pain, nausea, vomiting, dysuria, vaginal bleeding, vaginal discharge.  Past Medical History:  Diagnosis Date  . Allergy   . Dysautonomia (HCC)   . Headache(784.0)   . Hypertension     Patient Active Problem List   Diagnosis Date Noted  . Depression 03/09/2013  . 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 NO PAST SURGERIES      Prior to Admission medications   Not on File    Allergies Sulfa antibiotics  Family History  Problem Relation Age of Onset  . Cancer Mother        Cervical  . Asthma Mother   . Depression Mother   . Hyperlipidemia Father   . Depression Father   . Depression Maternal Grandmother   . Depression Maternal Grandfather   . Depression Paternal Grandmother   . Diabetes Paternal Grandmother   . Depression Paternal Grandfather   . Diabetes Paternal Grandfather     Social History Social History   Tobacco Use  . Smoking  status: Never Smoker  . Smokeless tobacco: Never Used  Substance Use Topics  . Alcohol use: No  . Drug use: No     Review of Systems  Constitutional: No chills Eyes: No visual changes. No discharge. ENT: Negative for congestion and rhinorrhea.  Positive for sore throat. Cardiovascular: No chest pain. Respiratory: Negative for cough. No SOB. Gastrointestinal: No nausea, no vomiting.  No diarrhea.  No constipation. Musculoskeletal: Negative for musculoskeletal pain. Skin: Negative for rash, abrasions, lacerations, ecchymosis. Neurological: Negative for headaches.   ____________________________________________   PHYSICAL EXAM:  VITAL SIGNS: ED Triage Vitals  Enc Vitals Group     BP 06/10/17 1741 (!) 110/57     Pulse Rate 06/10/17 1741 85     Resp 06/10/17 1741 18     Temp 06/10/17 1741 (!) 100.5 F (38.1 C)     Temp Source 06/10/17 1741 Oral     SpO2 06/10/17 1741 100 %     Weight 06/10/17 1743 159 lb (72.1 kg)     Height 06/10/17 1743 5\' 4"  (1.626 m)     Head Circumference --      Peak Flow --      Pain Score 06/10/17 1742 8     Pain Loc --      Pain Edu? --      Excl. in GC? --      Constitutional: Alert and oriented. Well appearing and in no acute  distress. Eyes: Conjunctivae are normal. PERRL. EOMI. No discharge. Head: Atraumatic. ENT: No frontal and maxillary sinus tenderness.      Ears: Tympanic membranes pearly gray with good landmarks. No discharge.      Nose: No congestion/rhinnorhea.      Mouth/Throat: Mucous membranes are moist. Oropharynx erythematous. Tonsils not enlarged. No exudates. Uvula midline. Neck: No stridor.   Hematological/Lymphatic/Immunilogical: No cervical lymphadenopathy. Cardiovascular: Normal rate, regular rhythm.  Good peripheral circulation. Respiratory: Normal respiratory effort without tachypnea or retractions. Lungs CTAB. Good air entry to the bases with no decreased or absent breath sounds. Gastrointestinal: Bowel sounds 4  quadrants. Soft and nontender to palpation. No guarding or rigidity. No palpable masses. No distention. Musculoskeletal: Full range of motion to all extremities. No gross deformities appreciated. Neurologic:  Normal speech and language. No gross focal neurologic deficits are appreciated.  Skin:  Skin is warm, dry and intact. No rash noted.   ____________________________________________   LABS (all labs ordered are listed, but only abnormal results are displayed)  Labs Reviewed  CBC - Abnormal; Notable for the following components:      Result Value   RBC 3.52 (*)    Hemoglobin 11.4 (*)    HCT 32.1 (*)    All other components within normal limits  COMPREHENSIVE METABOLIC PANEL - Abnormal; Notable for the following components:   CO2 21 (*)    BUN <5 (*)    Calcium 8.5 (*)    All other components within normal limits  URINALYSIS, COMPLETE (UACMP) WITH MICROSCOPIC - Abnormal; Notable for the following components:   Color, Urine YELLOW (*)    APPearance CLOUDY (*)    Bacteria, UA RARE (*)    Squamous Epithelial / LPF 6-30 (*)    All other components within normal limits  GROUP A STREP BY PCR  URINE CULTURE  INFLUENZA PANEL BY PCR (TYPE A & B)   ____________________________________________  EKG   ____________________________________________  RADIOLOGY Lexine Baton, personally viewed and evaluated these images (plain radiographs) as part of my medical decision making, as well as reviewing the written report by the radiologist.  US Ob Limited  Result Date: 06/10/2017 CLINICAL DATA:  22 year old pregnant female presents with abdominal cramping since yesterday. EDC by earliest sonogram: 11/14/2017, projecting to an expected gestational age of [redacted] weeks 4 days. EXAM: LIMITED OBSTETRIC ULTRASOUND FINDINGS: Number of Fetuses: 1 Heart Rate:  157 bpm Movement: Yes Presentation: Breech Placental Location: Anterior Previa: No Amniotic Fluid (Subjective):  Within normal limits. BPD:   4.0cm 18w 0d MATERNAL FINDINGS: Cervix: Appears closed. Cervix length approximately 3.4 cm on transabdominal scan. Uterus/Adnexae: No abnormality visualized. IMPRESSION: 1. Single living intrauterine gestation at 18 weeks 0 days by limited fetal biometry, concordant with provided dating. 2. No acute gestational abnormality. Normal fetal cardiac activity. Subjectively normal amniotic fluid volume. This exam is performed on an emergent basis and does not comprehensively evaluate fetal size, dating, or anatomy; follow-up complete OB US should be considered if further fetal assessment is warranted. Electronically Signed   By: Delbert Phenix M.D.   On: 06/10/2017 19:29    ____________________________________________    PROCEDURES  Procedure(s) performed:    Procedures    Medications  sodium chloride 0.9 % bolus 1,000 mL (0 mLs Intravenous Stopped 06/10/17 2004)  acetaminophen (TYLENOL) tablet 650 mg (650 mg Oral Given 06/10/17 2008)     ____________________________________________   INITIAL IMPRESSION / ASSESSMENT AND PLAN / ED COURSE  Pertinent labs & imaging results that were available  during my care of the patient were reviewed by me and considered in my medical decision making (see chart for details).  Review of the [place] CSRS was performed in accordance of the [facility] prior to dispensing any controlled drugs.   Patient's diagnosis is consistent with viral pharyngitis and a syncopal episode yesterday. Vital signs, labwork, and exam are reassuring. Syncopal episode is likely a vasovagal response since patient started to feel warm and had black spots in vision before passing out. She did not fall to the ground and was able to catch herself. She has a history of dysautonomia and episodes of hypotension and these episodes are not new.  Patient was given a fluid bolus.  Urinalysis shows rare bacteria which is likely contamination but will be sent for culture.  Influenza and strep are negative.  EKG  similar to previous in 2017.  Ultrasound shows single intrauterine pregnancy at [redacted] weeks gestation with normal fetal activity.  She has good follow-up with her family doctor and OB.  Patient appears well and is staying well hydrated. Patient feels comfortable going home. Patient is to follow up with PCP and OB as needed or otherwise directed. Patient is given ED precautions to return to the ED for any worsening or new symptoms.     ____________________________________________  FINAL CLINICAL IMPRESSION(S) / ED DIAGNOSES  Final diagnoses:  Viral pharyngitis  Dysautonomia (HCC)  Vasovagal episode      NEW MEDICATIONS STARTED DURING THIS VISIT:  ED Discharge Orders    None          This chart was dictated using voice recognition software/Dragon. Despite best efforts to proofread, errors can occur which can change the meaning. Any change was purely unintentional.    Enid Derry, PA-C 06/10/17 2340    Willy Eddy, MD 06/12/17 [phone]

## 2017-06-12 LAB — URINE CULTURE: Culture: NO GROWTH

## 2017-06-25 ENCOUNTER — Other Ambulatory Visit (HOSPITAL_COMMUNITY): Payer: Self-pay | Admitting: Obstetrics & Gynecology

## 2017-06-25 DIAGNOSIS — O289 Unspecified abnormal findings on antenatal screening of mother: Secondary | ICD-10-CM

## 2017-06-29 ENCOUNTER — Encounter (HOSPITAL_COMMUNITY): Payer: Self-pay | Admitting: *Deleted

## 2017-07-01 ENCOUNTER — Ambulatory Visit (HOSPITAL_COMMUNITY)
Admission: RE | Admit: 2017-07-01 | Discharge: 2017-07-01 | Disposition: A | Discharge status: Home / Self Care | Payer: 59 | Source: Ambulatory Visit | Attending: Obstetrics & Gynecology | Admitting: Obstetrics & Gynecology

## 2017-07-01 ENCOUNTER — Encounter (HOSPITAL_COMMUNITY): Payer: Self-pay

## 2017-07-01 DIAGNOSIS — Z3689 Encounter for other specified antenatal screening: Secondary | ICD-10-CM | POA: Insufficient documentation

## 2017-07-01 DIAGNOSIS — Z3A20 20 weeks gestation of pregnancy: Secondary | ICD-10-CM | POA: Insufficient documentation

## 2017-07-01 DIAGNOSIS — Z8279 Family history of other congenital malformations, deformations and chromosomal abnormalities: Secondary | ICD-10-CM

## 2017-07-01 DIAGNOSIS — O289 Unspecified abnormal findings on antenatal screening of mother: Secondary | ICD-10-CM | POA: Insufficient documentation

## 2017-07-01 DIAGNOSIS — G901 Familial dysautonomia [Riley-Day]: Secondary | ICD-10-CM | POA: Diagnosis not present

## 2017-07-01 DIAGNOSIS — Q796 Ehlers-Danlos syndrome: Secondary | ICD-10-CM | POA: Diagnosis not present

## 2017-07-01 DIAGNOSIS — O09892 Supervision of other high risk pregnancies, second trimester: Secondary | ICD-10-CM | POA: Diagnosis not present

## 2017-07-01 HISTORY — DX: Attention-deficit hyperactivity disorder, unspecified type: F90.9

## 2017-07-01 HISTORY — DX: Ehlers-Danlos syndrome, unspecified: Q79.60

## 2017-07-01 HISTORY — DX: Anxiety disorder, unspecified: F41.9

## 2017-07-01 NOTE — Consult Note (Addendum)
MFM consult  22 yr old G1P0 at [redacted]w[redacted]d with Ehlers Danlos syndrome referred by Dr. Pinn for fetal anatomic survey and consult.  Past OB hx: none PMH: Ehlers Danlos syndrome hypermobility type PSH: wisdom teeth Medications: diclegis, PNV Allergies: sulfa, shellfish  Ultrasound today shows: single intrauterine pregnancy. Fetal biometry is consistent with dating. Anterior placenta without evidence of previa. Normal amniotic fluid volume. Normal transabdominal cervical length. No adnexal masses seen. There is an echogenic focus in the left ventricle. The remainder of the fetal anatomic survey is normal.  I counseled the patient as follows: 1. Appropriate fetal growth. 2. Fetal anatomic survey is complete. 3. Echogenic focus in the left ventricle: - discussed association with increased risk of trisomy 21; discussed when found in isolation in patients with low risk screening the risk is not significantly increased - patient had low risk first trimester screen with risk of trisomy 21 of 1:3,826; risk of trisomies 13/18 was <1:10,000 4. Ehlers Danlos syndrome: - hypermobility type - patient met with the genetic counselor; see separate report - understands autosomal dominant inheritance pattern so fetus has a 50% risk of having Ehlers Danlos - discussed increased risk of cardiac malformations including aortic dilation and valvular disease- recommend maternal echocardiogram asap - recommend evaluation by Ophthalmology (if not recently performed) as there is an increased risk of myopia and glaucoma - recommend evaluate fetal growth in the third trimester - discussed association with increased risk of preterm labor, PPROM, and preterm delivery- thought to be due to cervical insufficiency - recommend preterm labor precautions - labor may progress more rapidly in Ehlers Danlos patients - discussed increased risk of injury especially in the third trimester given ligament laxity and body positioning -  there is no contraindication to vaginal delivery- recommend caution in leg positioning during pushing to minimize risk of injury - recommend Anesthesia consult in the third trimester - discussed increased risk of bleeding (post partum hemorrhage)- patient reports no history of bleeding episodes - also increased risk of perineal tears and poorer wound healing - there is an increased risk of uterine and bladder prolapse after delivery - patient reports no vascular incidents and has been seen by genetics and 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 Please call with questions.  Kristen Quinn, MD 

## 2017-07-01 NOTE — Progress Notes (Signed)
Genetic Myers  High-Risk Gestation Note  Appointment Date:  07/01/2017 Referred By: Sanjuana Kava, MD Date of Birth:  Sep 18, 1995 Partner:  Cassidy Myers   Pregnancy History: G1P0 Estimated Date of Delivery: 11/13/17 Estimated Gestational Age: 81w5dAttending: [place] MD   I met with Ms. Cassidy Myers because of her personal history of Ehlers-Danlos syndrome, hypermobility type.  In summary:  Patient diagnosed clinically in 2017 with possible Ehlers Danlos hypermobility type  Reviewed Hypermobility EDS follows autosomal dominant inheritance  Recurrence risk 1 in 2 (50%) for each offspring of an affected individual  Discussed options of screening / testing- no identified genetic cause for hypermobility type EDS, meaning prenatal diagnosis not available  Discussed that evaluation would be available to offspring in late childhood  See separate MFM consult note from today's visit for detailed discussion regarding pregnancy management  Discussed general population carrier screening options- patient elected to pursue today ([facility])  CF  SMA  Hemoglobinopathies  We began by reviewing the family history in detail. Cassidy Myers diagnosed with possible Ehlers Danlos syndrome, hypermobility type after medical genetics evaluation in 2017 with Cassidy Myers that she displayed some mild features of hypermobility syndrome. The patient declined detailed pedigree intake of her family history at today's visit, given that this information was reviewed in detail during the 2017 medical genetics evaluation.  Ms. HAlperreported that she is followed by a cardiologist. There is no known specific diagnosis of EDS or other connective tisse condition that is known in the family  Ehlers-Danlos syndromes (EDS) are a group of heritable connective tissue disorders characterized by joint laxity and specific skin  findings. Previous classifications delineated 11 types of EDS, with more recent classifications including six major types. Hypermobility type Ehlers-Danlos syndrome (formerly referred to as type III) is generally considered to be the least severe type of EDS and is characterized by soft, velvety skin that may be mildly hyperextensible, joint hypermobility with subluxations and dislocations, degenerative joint disease, chronic pain, and easy bruising. Additionally, functional bowel disorders, autonomic dysfunction (including POTS), aortic root dilation, and psychological problems may also be seen.    We discussed that the majority of EDS follow autosomal dominant inheritance. We reviewed genes and autosomal dominant inheritance. We discussed that both EDS-hypermobility type follows autosomal dominant inheritance. We discussed that in autosomal dominant (AD) conditions, an individual with the condition has one copy of a nonworking gene change (mutation). Males and females have equal chance to inherit the condition. Each offspring of an individual with the condition has a 1 in 2 (50%) chance to inherit the condition. However, we discussed that currently, an underlying molecular cause (gene) for EDS hypermobility type is not known, and thus, clinical molecular testing and prenatal testing is not available for EDS-hypermobility type. We discussed that it would be important for her pediatrician to be aware of this history so that her child(ren) can be evaluated at the appropriate age. We discussed that evaluation of children less than 183years of age, and especially less than 58years of age, for features of EDS hypermobility type is challenging due to normal joint laxity in that age group. See separate MFM consult note from today's visit for discussion of pregnancy management.   The family histories were otherwise found to be contributory for Down syndrome for the 137year old brother of the father of the pregnancy.  The patient reported that this individual has a history of heart surgeries  for congenital heart defect. She was unsure of the underlying karyotype for this relative. We discussed that 95% of cases of Down syndrome are not inherited and are the result of non-disjunction.  Three to 4% of cases of Down syndrome are the result of a translocation involving chromosome #21.  We discussed the option of chromosome analysis to determine if an individual is a carrier of a balanced translocation involving chromosome #21.  If an individual carries a balanced translocation involving chromosome #21, then the chance to have a baby with Down syndrome would be greater than the maternal age-related risk. The reported family history is most likely suggestive of sporadically occurring Down syndrome. We reviewed that the patient's a priori risk for fetal aneuploidy based on maternal age is considered low risk. She previously had first trimester screening through her OB office, which was within normal limits. She understands that screening cannot diagnose or rule out Down syndrome in pregnancy.  Without further information regarding the provided family history, an accurate genetic risk cannot be calculated. Further genetic Myers is warranted if more information is obtained.  Ms. Cassidy Myers was provided with written information regarding cystic fibrosis (CF), spinal muscular atrophy (SMA) and hemoglobinopathies including the carrier frequency, availability of carrier screening and prenatal diagnosis if indicated.  In addition, we discussed that CF and hemoglobinopathies are routinely screened for as part of the [place] newborn screening panel.  After further discussion, she elected to pursue screening for CF, SMA and hemoglobinopathies through [facility] at today's visit.  Ms. Cassidy Myers denied exposure to environmental toxins or chemical agents. She denied the use of alcohol, tobacco or street  drugs. She denied significant viral illnesses during the course of her pregnancy.  I counseled Ms. Cassidy Myers regarding the above risks and available options.  The approximate face-to-face time with the genetic counselor was 30 minutes.  Chipper Oman, MS Certified Genetic Counselor 07/01/2017

## 2017-07-06 ENCOUNTER — Other Ambulatory Visit: Payer: Self-pay

## 2017-07-06 ENCOUNTER — Observation Stay
Admission: EM | Admit: 2017-07-06 | Discharge: 2017-07-06 | Disposition: A | Discharge status: Home / Self Care | Payer: 59 | Attending: Obstetrics and Gynecology | Admitting: Obstetrics and Gynecology

## 2017-07-06 DIAGNOSIS — Z3A21 21 weeks gestation of pregnancy: Secondary | ICD-10-CM | POA: Insufficient documentation

## 2017-07-06 DIAGNOSIS — O26892 Other specified pregnancy related conditions, second trimester: Secondary | ICD-10-CM | POA: Diagnosis present

## 2017-07-06 DIAGNOSIS — R109 Unspecified abdominal pain: Secondary | ICD-10-CM

## 2017-07-06 DIAGNOSIS — R1030 Lower abdominal pain, unspecified: Secondary | ICD-10-CM | POA: Diagnosis not present

## 2017-07-06 LAB — URINALYSIS, ROUTINE W REFLEX MICROSCOPIC
Bilirubin Urine: NEGATIVE
Glucose, UA: NEGATIVE mg/dL
Hgb urine dipstick: NEGATIVE
Ketones, ur: NEGATIVE mg/dL
Nitrite: NEGATIVE
Protein, ur: NEGATIVE mg/dL
Specific Gravity, Urine: 1.012 (ref 1.005–1.030)
pH: 7 (ref 5.0–8.0)

## 2017-07-06 MED ORDER — TRAMADOL HCL 50 MG PO TABS
50.0000 mg | ORAL_TABLET | Freq: Once | ORAL | Status: AC
  Administered 2017-07-06: 50 mg via ORAL
  Filled 2017-07-06: qty 1

## 2017-07-06 NOTE — OB Triage Note (Signed)
Patient came in for observation for lower abdominal pain and pressure for the past two days that patient rates 8/10. Patient denies uterine contractions at this time. Patient denies leaking of fluid, denies vaginal bleeding and spotting. Patient reports nausea and vomiting for the past two days as well. Patient reports + FM on arrival. Vital signs stable and patient afebrile. Doppler tone 145-150. Patient in room by herself. Will continue to monitor.

## 2017-07-07 DIAGNOSIS — O26892 Other specified pregnancy related conditions, second trimester: Secondary | ICD-10-CM | POA: Diagnosis not present

## 2017-07-08 ENCOUNTER — Telehealth: Payer: Self-pay

## 2017-07-08 ENCOUNTER — Observation Stay
Admission: EM | Admit: 2017-07-08 | Discharge: 2017-07-08 | Disposition: A | Discharge status: Home / Self Care | Payer: 59 | Attending: Obstetrics and Gynecology | Admitting: Obstetrics and Gynecology

## 2017-07-08 ENCOUNTER — Other Ambulatory Visit: Payer: Self-pay

## 2017-07-08 DIAGNOSIS — R109 Unspecified abdominal pain: Secondary | ICD-10-CM | POA: Diagnosis present

## 2017-07-08 DIAGNOSIS — Z3A21 21 weeks gestation of pregnancy: Secondary | ICD-10-CM | POA: Insufficient documentation

## 2017-07-08 DIAGNOSIS — O26892 Other specified pregnancy related conditions, second trimester: Principal | ICD-10-CM | POA: Insufficient documentation

## 2017-07-08 DIAGNOSIS — O26899 Other specified pregnancy related conditions, unspecified trimester: Secondary | ICD-10-CM | POA: Diagnosis present

## 2017-07-08 MED ORDER — ACETAMINOPHEN-CODEINE #3 300-30 MG PO TABS
1.0000 | ORAL_TABLET | Freq: Once | ORAL | Status: AC
  Administered 2017-07-08: 1 via ORAL

## 2017-07-08 MED ORDER — OXYCODONE HCL 5 MG PO TABS
5.0000 mg | ORAL_TABLET | Freq: Once | ORAL | Status: AC
  Administered 2017-07-08: 5 mg via ORAL
  Filled 2017-07-08: qty 1

## 2017-07-08 MED ORDER — ACETAMINOPHEN-CODEINE #3 300-30 MG PO TABS
ORAL_TABLET | ORAL | Status: AC
  Filled 2017-07-08: qty 1

## 2017-07-08 MED ORDER — OXYCODONE-ACETAMINOPHEN 5-325 MG PO TABS: 1.0000 | ORAL_TABLET | Freq: Four times a day (QID) | ORAL | 0 refills | Status: DC | PRN

## 2017-07-08 MED ORDER — ONDANSETRON 4 MG PO TBDP: 4.0000 mg | ORAL_TABLET | Freq: Four times a day (QID) | ORAL | Status: DC | PRN

## 2017-07-08 NOTE — Final Progress Note (Signed)
L&D OB Triage Note  Cassidy Myers is a 22 y.o. G1P0 female at 7111w3d, EDD Estimated Date of Delivery: 11/13/17 who presented to triage for complaints of lower abdominal pain 8/10 x 2 days.  Denies vaginal bleeding, LOF, contractions. She does have some nausea and vomiting, but this has been throughout the pregnancy, managed by Diclegis.  Took Tylenol ES which has not helped.  Also noting some mild discomfort with urination. She was evaluated by the nurses with no significant findings for her abdominal pain. Vital signs stable.  She was treated with a dose of Ultram 50 mg which decreased her pain to a 3/10.     Exam: . Blood pressure 106/62, pulse 71, temperature 98.2 F (36.8 C), temperature source Oral, resp. rate 18, last menstrual period 01/31/2017.  Fetal assessment:  Mode: Doppler Baseline Rate (A): 145 bpm               Labs:  Results for orders placed or performed during the hospital encounter of 07/06/17  Urinalysis, Routine w reflex microscopic  Result Value Ref Range   Color, Urine YELLOW (A) YELLOW   APPearance CLOUDY (A) CLEAR   Specific Gravity, Urine 1.012 1.005 - 1.030   pH 7.0 5.0 - 8.0   Glucose, UA NEGATIVE NEGATIVE mg/dL   Hgb urine dipstick NEGATIVE NEGATIVE   Bilirubin Urine NEGATIVE NEGATIVE   Ketones, ur NEGATIVE NEGATIVE mg/dL   Protein, ur NEGATIVE NEGATIVE mg/dL   Nitrite NEGATIVE NEGATIVE   Leukocytes, UA TRACE (A) NEGATIVE   RBC / HPF 0-5 0 - 5 RBC/hpf   WBC, UA 0-5 0 - 5 WBC/hpf   Bacteria, UA RARE (A) NONE SEEN   Squamous Epithelial / LPF 0-5 (A) NONE SEEN   Mucus PRESENT      Plan: FHT obtained. Could not perform NST due to gestational age and difficulty monitoring fetus due to activity. She was ruled out for a UTI. She was discharged home with bleeding/preterm labor precautions.  Continue routine prenatal care. Follow up with OB/GYN as previously scheduled.     Hildred LaserAnika Cherry, MD [facility]

## 2017-07-08 NOTE — ED Triage Notes (Signed)
Pt presents w/ c/o abdominal pain since Wednesday morning. Pt seen and treated for UTI in ED on Tuesday night. Pt has been nauseated today and has had poor oral PO intake today due to nausea. Pt states she has not felt the baby move since last night.

## 2017-07-08 NOTE — Discharge Instructions (Signed)

## 2017-07-08 NOTE — OB Triage Note (Signed)
Pt came in through the ED with abdominal pain, she was then sent to L&D for DFM. She states she hadnt felt the baby move since last night. RN palpated fetal movement and was able to hear the fetal heart rate via doppler for 3 minutes. 145 bpm. She c/o sharp pain that comes around the side of her abdomen towards the front. She denies any strenuous activity, last sex intercourse was several months ago.

## 2017-07-09 ENCOUNTER — Other Ambulatory Visit: Payer: Self-pay

## 2017-07-10 NOTE — Discharge Summary (Signed)
L&D OB Triage Note  Cassidy Myers is an unassigned 22 y.o. G1P0 female at 7161w5d, EDD Estimated Date of Delivery: 11/13/17 who presented to triage for complaints of continued abdominal pain 8/10.  [place] She was seen in triage 2 days ago with similar complaint. Pain is in LLQ and umbilical. Denied issues with bowel movements. Notes nausea (but no vomiting), usually controlled by Dicelgis but her little brother "hid" her meds and now she cannot find them.   Denied h/o kidney stones. She was evaluated by the nurses with no significant findings.  Patient overall appeared mildly uncomfortable and in no acute distress. Vital signs stable. Fetal heart tones were obtained. She was treated with Oxycodone x 1 dose and Zofran x 1 dose.    Exam:  Mode: Doppler Baseline Rate (A): 143 bpm(listened to FHR for 2 minutes)      Plan: FHT appreciated. Reviewed patient's recent ultrasound to r/o presence of ovarian cyst or other pathology.  Normal fetal anatomy scan.  She was discharged home with pain precautions. She was given a notice to remain out of work tomorrow to rest.   Continue routine prenatal care. Follow up with her OB/GYN as previously scheduled.     Hildred LaserAnika Cherry, MD [facility]

## 2017-07-12 ENCOUNTER — Telehealth (HOSPITAL_COMMUNITY): Payer: Self-pay | Admitting: MS"

## 2017-07-12 NOTE — Telephone Encounter (Signed)
Called Ms. Amarise Kosak to discuss her carrier screening results. Ms. Lemmie EvensHaught had carrier screening for the ACOG recommended conditions (SMA, CF, and hemoglobinopathies) through [facility]. The patient was identified by name and DOB. We reviewed that the results are negative, indicating that she does not have a detectable gene alteration in any of the genes for which analysis was performed. We reviewed that carrier screening does not detect all carriers of these conditions, but a normal result significantly decreases the likelihood of being a carrier, and therefore, the overall reproductive risk. We reviewed that this laboratory sequences most of the genes, which is associated with a high detection rate for carriers, thus a negative screen is very reassuring. All questions were answered to her satisfaction, she was encouraged to call with additional questions or concerns. ? Quinn PlowmanKaren Corneliussen, MS Patent attorneyCertified Genetic Counselor

## 2017-07-20 ENCOUNTER — Ambulatory Visit (INDEPENDENT_AMBULATORY_CARE_PROVIDER_SITE_OTHER): Payer: 59 | Admitting: Internal Medicine

## 2017-07-20 ENCOUNTER — Ambulatory Visit: Payer: 59 | Admitting: Internal Medicine

## 2017-07-20 ENCOUNTER — Encounter: Payer: Self-pay | Admitting: Internal Medicine

## 2017-07-20 VITALS — BP 116/60 | HR 74 | Ht 64.0 in | Wt 165.0 lb

## 2017-07-20 DIAGNOSIS — G901 Familial dysautonomia [Riley-Day]: Secondary | ICD-10-CM | POA: Diagnosis not present

## 2017-07-20 NOTE — Patient Instructions (Addendum)
Medication Instructions:  Your physician recommends that you continue on your current medications as directed. Please refer to the Current Medication list given to you today.  Labwork: None ordered.  Testing/Procedures: None ordered.  Follow-Up: Your physician recommends that you schedule a follow-up appointment in:   3 months with Dr Graciela HusbandsKlein    Any Other Special Instructions Will Be Listed Below (If Applicable).  Follow sodium recommendations discussed with Dr Graciela HusbandsKlein     If you need a refill on your cardiac medications before your next appointment, please call your pharmacy.

## 2017-07-20 NOTE — Progress Notes (Signed)
      Patient Care Team: Lorre MunroeBaity, Regina W, NP as PCP - General (Internal Medicine)   HPI  Cassidy Myers is a 22 y.o. 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  She has not been seen in a couple of years.  In the interim she was apparently seen at [facility] although her records are not available in care everywhere.  She was diagnosed with Ehlers-Danlos syndrome.  I presume this is joint hypermobility.  She is now [redacted] weeks pregnant.  She been doing quite well but has had lots of problems with recurrent presyncope and syncope since becoming pregnant.  She has been working at [employer] in a job where she is standing.  She has a prodrome that lasts about a minute-3 minutes characterized by tinnitus, nausea, diaphoresis and warmth.    Past Medical History:  Diagnosis Date  . ADHD   . Allergy   . Anxiety   . Dysautonomia (HCC)   . Ehlers-Danlos syndrome   . WUJWJXBJ(478.2Headache(784.0)     Past Surgical History:  Procedure Laterality Date  . NO PAST SURGERIES    . WISDOM TOOTH EXTRACTION      Current Outpatient Medications  Medication Sig Dispense Refill  . Doxylamine-Pyridoxine (DICLEGIS PO) Take by mouth.    . oxyCODONE-acetaminophen (PERCOCET/ROXICET) 5-325 MG tablet Take 1 tablet by mouth every 6 (six) hours as needed. 15 tablet 0  . Prenatal Vit-Fe Fumarate-FA (PRENATAL VITAMIN PO) Take by mouth.     No current facility-administered medications for this visit.     Allergies  Allergen Reactions  . Sulfa Antibiotics Nausea And Vomiting      Review of Systems negative except from HPI and PMH  Physical Exam BP 116/60   Pulse 74   Ht 5\' 4"  (1.626 m)   Wt 165 lb (74.8 kg)   LMP 01/31/2017 Comment: currently pregnant     SpO2 98%   BMI 28.32 kg/m  Well developed and nourished in no acute distress HENT normal Neck supple with JVP-flat Clear Regular rate and rhythm, no murmurs or gallops Abd-gravid No Clubbing  cyanosis edema Skin-warm and dry A & Oriented  Grossly normal sensory and motor function    Assessment and  Plan Dysautonomia  Depression  Pregnancy   She is having recurrent syncope.  We reviewed the importance of recognizing the prodrome and  lying down.  We reviewed the potential benefits of a stool at work.  Discussed the advantage of salt supplementation.  I suspect she is fluid replete.  Normally, the second trimester is associated with fewer symptoms; her situation is unusual in this regard.  Question from OB as to do with risks at the time of delivery.  I am unable to find the notes from [facility] regarding the diagnosis of the type of Ehlers-Danlos.  In the context of dysautonomia, attention to volume is essential.  In the context of her Ehlers-Danlos I do not have specific recommendations.  In the interim, I have encouraged her to increase her salt intake and have given her the names of different buffered salt supplements that she can take to twice a day.  We have also discussed the role of compressive clothing particularly thigh sleeves  More than 50% of 45 min was spent in counseling related to the above

## 2017-07-23 ENCOUNTER — Other Ambulatory Visit: Payer: Self-pay | Admitting: Obstetrics & Gynecology

## 2017-07-23 ENCOUNTER — Telehealth: Payer: Self-pay | Admitting: Internal Medicine

## 2017-07-23 DIAGNOSIS — Q796 Ehlers-Danlos syndrome, unspecified: Secondary | ICD-10-CM

## 2017-07-23 NOTE — Telephone Encounter (Signed)
New message    [facility] [phone] - called they need to know how dangerous it is for this patient to go through delivery, she has Ehlers-Dahnos type 1- you would have to call [facility] and get the records from them to confirm diagnosis

## 2017-07-23 NOTE — Telephone Encounter (Signed)
I returned call to [facility] and left message to call our office.

## 2017-07-29 ENCOUNTER — Ambulatory Visit (HOSPITAL_COMMUNITY): Payer: 59 | Attending: Cardiovascular Disease

## 2017-07-29 ENCOUNTER — Other Ambulatory Visit: Payer: Self-pay

## 2017-07-29 DIAGNOSIS — Q796 Ehlers-Danlos syndrome, unspecified: Secondary | ICD-10-CM

## 2017-08-15 ENCOUNTER — Inpatient Hospital Stay (HOSPITAL_COMMUNITY)
Admission: AD | Admit: 2017-08-15 | Discharge: 2017-08-15 | Disposition: A | Discharge status: Home / Self Care | Payer: 59 | Source: Ambulatory Visit | Attending: Obstetrics and Gynecology | Admitting: Obstetrics and Gynecology

## 2017-08-15 ENCOUNTER — Other Ambulatory Visit: Payer: Self-pay

## 2017-08-15 DIAGNOSIS — Z3A27 27 weeks gestation of pregnancy: Secondary | ICD-10-CM | POA: Insufficient documentation

## 2017-08-15 DIAGNOSIS — R109 Unspecified abdominal pain: Secondary | ICD-10-CM | POA: Diagnosis present

## 2017-08-15 DIAGNOSIS — O26892 Other specified pregnancy related conditions, second trimester: Secondary | ICD-10-CM | POA: Diagnosis not present

## 2017-08-15 DIAGNOSIS — R1031 Right lower quadrant pain: Secondary | ICD-10-CM

## 2017-08-15 DIAGNOSIS — Z882 Allergy status to sulfonamides status: Secondary | ICD-10-CM | POA: Diagnosis not present

## 2017-08-15 DIAGNOSIS — M549 Dorsalgia, unspecified: Secondary | ICD-10-CM | POA: Diagnosis not present

## 2017-08-15 DIAGNOSIS — O9989 Other specified diseases and conditions complicating pregnancy, childbirth and the puerperium: Secondary | ICD-10-CM

## 2017-08-15 DIAGNOSIS — O99891 Other specified diseases and conditions complicating pregnancy: Secondary | ICD-10-CM

## 2017-08-15 LAB — URINALYSIS, ROUTINE W REFLEX MICROSCOPIC
Bilirubin Urine: NEGATIVE
Glucose, UA: NEGATIVE mg/dL
Hgb urine dipstick: NEGATIVE
Ketones, ur: NEGATIVE mg/dL
Leukocytes, UA: NEGATIVE
Nitrite: NEGATIVE
Protein, ur: NEGATIVE mg/dL
Specific Gravity, Urine: 1.01 (ref 1.005–1.030)
pH: 8 (ref 5.0–8.0)

## 2017-08-15 MED ORDER — CYCLOBENZAPRINE HCL 10 MG PO TABS: 10.0000 mg | ORAL_TABLET | Freq: Three times a day (TID) | ORAL | 0 refills | Status: AC

## 2017-08-15 NOTE — Discharge Instructions (Signed)
Exercised to stretch out hips and lower back  Ice to low back TID for 15-20 minutes Pregnancy support belt  3 day course of Flexeril Out of work tomorrow Call to make an appointment in the office this week to discuss referral for physical therapy

## 2017-08-15 NOTE — Progress Notes (Addendum)
G1 @ 27.[redacted] wksga. Here for right side ligament pain. Denies LOF or bleedng. +FM  EFM applied. VSS see flow sheet for details  1220: MD notified. Report given. Ordered for MAU provider to see pt MAU provider made aware.   Provider at bs.   1227: Provider came out of room and intruct initiate "Triple X". Security called and will be in unit shortly  1350: provider at bs reassessing.   1350: D/C instructions given with pt understanding. Pt left unit via ambulatory with family

## 2017-08-15 NOTE — MAU Provider Note (Signed)
History     CSN: [id number]  Arrival date and time: 08/15/17 1136   First Provider Initiated Contact with Patient 08/15/17 1320      Chief Complaint  Patient presents with  . Abdominal Pain    ligament pain right side    HPI Cassidy Myers 22 y.o. 3232w1d  Comes to MAU today with pain that is worsening on her right side and originating in the right groin.  Thinking it is round ligament pain but despite tylenol and codeine, pain has not resolved at all.  Client has a history of Ehlers-Danlos syndrome.and is worried that something is wrong.  OB History    Gravida  1   Para      Term      Preterm      AB      Living        SAB      TAB      Ectopic      Multiple      Live Births              Past Medical History:  Diagnosis Date  . ADHD   . Allergy   . Anxiety   . Dysautonomia (HCC)   . Ehlers-Danlos syndrome   . WUJWJXBJ(478.2Headache(784.0)     Past Surgical History:  Procedure Laterality Date  . NO PAST SURGERIES    . WISDOM TOOTH EXTRACTION      Family History  Problem Relation Age of Onset  . Cancer Mother        Cervical  . Asthma Mother   . Depression Mother   . Hyperlipidemia Father   . Depression Father   . Depression Maternal Grandmother   . Depression Maternal Grandfather   . Depression Paternal Grandmother   . Diabetes Paternal Grandmother   . Depression Paternal Grandfather   . Diabetes Paternal Grandfather     Social History   Tobacco Use  . Smoking status: Never Smoker  . Smokeless tobacco: Never Used  Substance Use Topics  . Alcohol use: No  . Drug use: No    Allergies:  Allergies  Allergen Reactions  . Sulfa Antibiotics Nausea And Vomiting    Medications Prior to Admission  Medication Sig Dispense Refill Last Dose  . acetaminophen (TYLENOL) 500 MG tablet Take 500-1,000 mg by mouth every 6 (six) hours as needed for mild pain.   08/14/2017 at Unknown time  . Doxylamine-Pyridoxine (DICLEGIS PO) Take 1 tablet by mouth daily as  needed (For nausea.).    Past Week at Unknown time  . ibuprofen (ADVIL,MOTRIN) 200 MG tablet Take 400 mg by mouth daily as needed for mild pain.   08/14/2017 at Unknown time  . oxyCODONE (OXY IR/ROXICODONE) 5 MG immediate release tablet Take 5 mg by mouth every 4 (four) hours as needed for severe pain.   08/14/2017 at Unknown time  . Prenatal Vit-Fe Fumarate-FA (PRENATAL MULTIVITAMIN) TABS tablet Take 1 tablet by mouth daily at 12 noon.   08/14/2017 at Unknown time  . oxyCODONE-acetaminophen (PERCOCET/ROXICET) 5-325 MG tablet Take 1 tablet by mouth every 6 (six) hours as needed. (Patient not taking: Reported on 08/15/2017) 15 tablet 0 Not Taking at Unknown time    Review of Systems  Constitutional: Negative for fever.  Gastrointestinal: Negative for abdominal pain, nausea and vomiting.  Genitourinary: Negative for vaginal bleeding and vaginal discharge.  Musculoskeletal: Positive for back pain.       Pain in right leg and right groin Difficulty walking  and getting into the tub   Physical Exam   Height 5\' 4"  (1.626 m), weight 179 lb (81.2 kg), last menstrual period 01/31/2017.  Physical Exam  Nursing note and vitals reviewed. Constitutional: She is oriented to person, place, and time. She appears well-developed and well-nourished.  HENT:  Head: Normocephalic.  Eyes: EOM are normal.  Neck: Neck supple.  GI: Soft. There is no tenderness. There is no rebound and no guarding.  FHT baseline is 135 with moderate variability.  Occasional variable seen with quick recovery to baseline.  10 x 10 acces seen - appropriate for gestational age.  No contractions.  Reassuring strip.  Musculoskeletal: Normal range of motion.  Demonstrated exercises with legs and right leg has much more limited range of motion than the left due to the pain she is feeling  Pain with palpation of upper thigh muscles on right side.  Tearful with attempt to do the exercises with her right leg.  Able to complete all with her left  leg with no pain.  Neurological: She is alert and oriented to person, place, and time.  Skin: Skin is warm and dry.  Psychiatric: She has a normal mood and affect.    MAU Course  Procedures  MDM Demonstrated exercises to client and her mother.  Is not as able to do them with her right leg but her mother will assist and they will work on slow progressive stretching to decrease pain. Discussed plan of care with Dr. Estanislado Pandy  Assessment and Plan  Pubic Symphysis discomfort that is severe on the right side  Plan Exercised to stretch out hips and lower back  Ice to low back TID for 15-20 minutes Pregnancy support belt  3 day course of Flexeril Use massage on upper thigh to encourage muscle relaxation Out of work tomorrow - note given Call to make an appointment in the office this week to discuss referral for physical therapy   Cassidy Myers 08/15/2017, 1:21 PM

## 2017-08-24 ENCOUNTER — Inpatient Hospital Stay (HOSPITAL_COMMUNITY)
Admission: AD | Admit: 2017-08-24 | Discharge: 2017-08-24 | Disposition: A | Discharge status: Home / Self Care | Payer: 59 | Source: Ambulatory Visit | Attending: Obstetrics and Gynecology | Admitting: Obstetrics and Gynecology

## 2017-08-24 ENCOUNTER — Encounter (HOSPITAL_COMMUNITY): Payer: Self-pay

## 2017-08-24 DIAGNOSIS — Z3689 Encounter for other specified antenatal screening: Secondary | ICD-10-CM

## 2017-08-24 DIAGNOSIS — Z3A28 28 weeks gestation of pregnancy: Secondary | ICD-10-CM | POA: Insufficient documentation

## 2017-08-24 DIAGNOSIS — O368130 Decreased fetal movements, third trimester, not applicable or unspecified: Secondary | ICD-10-CM

## 2017-08-24 NOTE — MAU Note (Signed)
Decreased FM, has felt some flutters but no strong movements (none since yesterday morning). No vag bleeding, no LOF.

## 2017-08-24 NOTE — Discharge Instructions (Signed)

## 2017-08-24 NOTE — MAU Provider Note (Signed)
History     CSN: [id number]  Arrival date and time: 08/24/17 1210   First Provider Initiated Contact with Patient 08/24/17 1305      Chief Complaint  Patient presents with  . Decreased Fetal Movement   HPI  Ms.Cassidy Myers is a 22 y.o. female G1P0 here in MAU with decreased fetal movement. Says that yesterday and today the babies movements are less and feel different. She says the movements feel like flutters. Since her arrival here she has felt her baby move twice. She see's her abdomen moving but does not feel the movement's as strong as she thought they should be.   OB History    Gravida  1   Para      Term      Preterm      AB      Living        SAB      TAB      Ectopic      Multiple      Live Births              Past Medical History:  Diagnosis Date  . ADHD   . Allergy   . Anxiety   . Dysautonomia (HCC)   . Ehlers-Danlos syndrome   . AOZHYQMV(784.6Headache(784.0)     Past Surgical History:  Procedure Laterality Date  . NO PAST SURGERIES    . WISDOM TOOTH EXTRACTION      Family History  Problem Relation Age of Onset  . Cancer Mother        Cervical  . Asthma Mother   . Depression Mother   . Hyperlipidemia Father   . Depression Father   . Depression Maternal Grandmother   . Depression Maternal Grandfather   . Depression Paternal Grandmother   . Diabetes Paternal Grandmother   . Depression Paternal Grandfather   . Diabetes Paternal Grandfather     Social History   Tobacco Use  . Smoking status: Never Smoker  . Smokeless tobacco: Never Used  Substance Use Topics  . Alcohol use: No  . Drug use: No    Allergies:  Allergies  Allergen Reactions  . Sulfa Antibiotics Nausea And Vomiting    Medications Prior to Admission  Medication Sig Dispense Refill Last Dose  . acetaminophen (TYLENOL) 500 MG tablet Take 500-1,000 mg by mouth every 6 (six) hours as needed for mild pain.   08/14/2017 at Unknown time  . Doxylamine-Pyridoxine (DICLEGIS  PO) Take 1 tablet by mouth daily as needed (For nausea.).    Past Week at Unknown time  . oxyCODONE (OXY IR/ROXICODONE) 5 MG immediate release tablet Take 5 mg by mouth every 4 (four) hours as needed for severe pain.   08/14/2017 at Unknown time  . Prenatal Vit-Fe Fumarate-FA (PRENATAL MULTIVITAMIN) TABS tablet Take 1 tablet by mouth daily at 12 noon.   08/14/2017 at Unknown time   No results found for this or any previous visit (from the past 48 hour(s)).  Review of Systems  Gastrointestinal: Negative for abdominal pain.  Genitourinary: Negative for dysuria, vaginal bleeding and vaginal discharge.   Physical Exam   Blood pressure 118/60, pulse (!) 110, temperature 98.6 F (37 C), temperature source Oral, resp. rate 18, height 5\' 4"  (1.626 m), weight 178 lb (80.7 kg), last menstrual period 01/31/2017.  Physical Exam  Constitutional: She is oriented to person, place, and time. She appears well-developed and well-nourished. No distress.  HENT:  Head: Normocephalic.  Neck: Neck supple.  GI: Soft. She exhibits no distension. There is no tenderness. There is no rebound.  Neurological: She is alert and oriented to person, place, and time.  Skin: Skin is warm. She is not diaphoretic.  Psychiatric: Her behavior is normal.   Fetal Tracing: Baseline: 125 bpm Variability: Moderate  Accelerations: 15x15 Decelerations: None Toco: None  MAU Course  Procedures None  MDM  Anterior placenta : discussed this with the patient.  NST reactive: discussed with Dr. Normand Sloop, ok for DC home   Assessment and Plan   A:  1. Decreased fetal movement affecting management of pregnancy in third trimester, single or unspecified fetus   2. NST (non-stress test) reactive     P:  Discharge home in stable condition  Kick counts discussed Return to MAU if symptoms worsen Follow up with OB  Rasch, Harolyn Rutherford, NP 08/24/2017 2:19 PM

## 2017-09-14 ENCOUNTER — Ambulatory Visit: Payer: 59 | Admitting: Internal Medicine

## 2017-09-20 LAB — OB RESULTS CONSOLE GBS: GBS: NEGATIVE

## 2017-09-21 ENCOUNTER — Inpatient Hospital Stay (HOSPITAL_COMMUNITY)
Admission: AD | Admit: 2017-09-21 | Discharge: 2017-09-21 | Disposition: A | Discharge status: Home / Self Care | Payer: 59 | Source: Ambulatory Visit | Attending: Obstetrics & Gynecology | Admitting: Obstetrics & Gynecology

## 2017-09-21 ENCOUNTER — Encounter (HOSPITAL_COMMUNITY): Payer: Self-pay | Admitting: *Deleted

## 2017-09-21 ENCOUNTER — Other Ambulatory Visit: Payer: Self-pay

## 2017-09-21 DIAGNOSIS — Z3493 Encounter for supervision of normal pregnancy, unspecified, third trimester: Secondary | ICD-10-CM | POA: Diagnosis present

## 2017-09-21 DIAGNOSIS — F909 Attention-deficit hyperactivity disorder, unspecified type: Secondary | ICD-10-CM | POA: Insufficient documentation

## 2017-09-21 DIAGNOSIS — O99343 Other mental disorders complicating pregnancy, third trimester: Secondary | ICD-10-CM | POA: Insufficient documentation

## 2017-09-21 DIAGNOSIS — O1494 Unspecified pre-eclampsia, complicating childbirth: Secondary | ICD-10-CM | POA: Insufficient documentation

## 2017-09-21 DIAGNOSIS — Z818 Family history of other mental and behavioral disorders: Secondary | ICD-10-CM | POA: Insufficient documentation

## 2017-09-21 DIAGNOSIS — Z0189 Encounter for other specified special examinations: Secondary | ICD-10-CM

## 2017-09-21 DIAGNOSIS — Q796 Ehlers-Danlos syndrome: Secondary | ICD-10-CM | POA: Diagnosis not present

## 2017-09-21 DIAGNOSIS — O26893 Other specified pregnancy related conditions, third trimester: Secondary | ICD-10-CM | POA: Insufficient documentation

## 2017-09-21 DIAGNOSIS — G44209 Tension-type headache, unspecified, not intractable: Secondary | ICD-10-CM

## 2017-09-21 DIAGNOSIS — Z331 Pregnant state, incidental: Secondary | ICD-10-CM

## 2017-09-21 DIAGNOSIS — Z3A34 34 weeks gestation of pregnancy: Secondary | ICD-10-CM | POA: Insufficient documentation

## 2017-09-21 DIAGNOSIS — F419 Anxiety disorder, unspecified: Secondary | ICD-10-CM | POA: Diagnosis not present

## 2017-09-21 DIAGNOSIS — Z8279 Family history of other congenital malformations, deformations and chromosomal abnormalities: Secondary | ICD-10-CM

## 2017-09-21 DIAGNOSIS — Z3A32 32 weeks gestation of pregnancy: Secondary | ICD-10-CM | POA: Diagnosis not present

## 2017-09-21 LAB — COMPREHENSIVE METABOLIC PANEL
ALT: 12 U/L — ABNORMAL LOW (ref 14–54)
AST: 16 U/L (ref 15–41)
Albumin: 2.9 g/dL — ABNORMAL LOW (ref 3.5–5.0)
Alkaline Phosphatase: 68 U/L (ref 38–126)
Anion gap: 7 (ref 5–15)
BUN: 10 mg/dL (ref 6–20)
CO2: 22 mmol/L (ref 22–32)
Calcium: 8.9 mg/dL (ref 8.9–10.3)
Chloride: 107 mmol/L (ref 101–111)
Creatinine, Ser: 0.44 mg/dL (ref 0.44–1.00)
GFR calc Af Amer: >60 mL/min (ref >60)
GFR calc non Af Amer: >60 mL/min (ref >60)
Glucose, Bld: 86 mg/dL (ref 65–99)
Potassium: 3.8 mmol/L (ref 3.5–5.1)
Sodium: 136 mmol/L (ref 135–145)
Total Bilirubin: 0.5 mg/dL (ref 0.3–1.2)
Total Protein: 6.6 g/dL (ref 6.5–8.1)

## 2017-09-21 LAB — URIC ACID: Uric Acid, Serum: 3.1 mg/dL (ref 2.3–6.6)

## 2017-09-21 LAB — URINALYSIS, ROUTINE W REFLEX MICROSCOPIC
Bilirubin Urine: NEGATIVE
Glucose, UA: NEGATIVE mg/dL
Hgb urine dipstick: NEGATIVE
Ketones, ur: NEGATIVE mg/dL
Leukocytes, UA: NEGATIVE
Nitrite: NEGATIVE
Protein, ur: NEGATIVE mg/dL
Specific Gravity, Urine: 1.016 (ref 1.005–1.030)
pH: 6 (ref 5.0–8.0)

## 2017-09-21 LAB — CBC
HCT: 27.3 % — ABNORMAL LOW (ref 36.0–46.0)
Hemoglobin: 9.1 g/dL — ABNORMAL LOW (ref 12.0–15.0)
MCH: 29.4 pg (ref 26.0–34.0)
MCHC: 33.3 g/dL (ref 30.0–36.0)
MCV: 88.1 fL (ref 78.0–100.0)
Platelets: 290 10*3/uL (ref 150–400)
RBC: 3.1 MIL/uL — ABNORMAL LOW (ref 3.87–5.11)
RDW: 12.4 % (ref 11.5–15.5)
WBC: 9.9 10*3/uL (ref 4.0–10.5)

## 2017-09-21 LAB — PROTEIN / CREATININE RATIO, URINE
Creatinine, Urine: 101 mg/dL
Protein Creatinine Ratio: 0.12 mg/mg{Cre} (ref 0.00–0.15)
Total Protein, Urine: 12 mg/dL

## 2017-09-21 LAB — LACTATE DEHYDROGENASE: LDH: 104 U/L (ref 98–192)

## 2017-09-21 NOTE — Discharge Instructions (Signed)

## 2017-09-21 NOTE — Progress Notes (Signed)
History     CSN: [id number]  Arrival date and time: 09/21/17 1546   None     Chief Complaint  Patient presents with  . BP Eval   Pt present to MAU for evaluatioj of PIH without elevated Bps, EDD 11/13/2017, LMP 01/31/2017, G1P0000 @ 32_2. Pt was referred from clinic to day to the MAU for evaluating of suspicion of PIH. Report was taken my Viiki, CNM that pt has PCR of 0.88, with HA, spots in visit, and on and off swelling X 3 weeks. Pt endorses having swelling that reduces when lying down, but feels she has gain a lo tof weight from swelling. {Pt has h/o POTTS & EDS. Has been evaluated and cleared with MFM and Cards for vaginal delivery at term. Pt currently denies HA, vision changes, right upper gastric pain, but endorses lower back pain, "feels like menstral cramps", and wrapps around sides. Pt denies dysuria, hematuria, no vaginal discharge. Pt endorses +FM, denies LOF, VB.      Past Medical History:  Diagnosis Date  . ADHD   . Allergy   . Anxiety   . Dysautonomia (HCC)   . Ehlers-Danlos syndrome   . NWGNFAOZ(308.6)     Past Surgical History:  Procedure Laterality Date  . NO PAST SURGERIES    . WISDOM TOOTH EXTRACTION      Family History  Problem Relation Age of Onset  . Cancer Mother        Cervical  . Asthma Mother   . Depression Mother   . Depression Father   . Schizophrenia Father   . Depression Maternal Grandmother   . Depression Maternal Grandfather   . Depression Paternal Grandmother   . Diabetes Paternal Grandmother   . Depression Paternal Grandfather   . Diabetes Paternal Grandfather     Social History   Tobacco Use  . Smoking status: Never Smoker  . Smokeless tobacco: Never Used  Substance Use Topics  . Alcohol use: No  . Drug use: No    Allergies:  Allergies  Allergen Reactions  . Sulfa Antibiotics Nausea And Vomiting    Medications Prior to Admission  Medication Sig Dispense Refill Last Dose  . acetaminophen (TYLENOL) 500 MG tablet  Take 500-1,000 mg by mouth every 6 (six) hours as needed for mild pain.   08/14/2017 at Unknown time  . Doxylamine-Pyridoxine (DICLEGIS PO) Take 1 tablet by mouth daily as needed (For nausea.).    Past Week at Unknown time  . oxyCODONE (OXY IR/ROXICODONE) 5 MG immediate release tablet Take 5 mg by mouth every 4 (four) hours as needed for severe pain.   08/14/2017 at Unknown time  . Prenatal Vit-Fe Fumarate-FA (PRENATAL MULTIVITAMIN) TABS tablet Take 1 tablet by mouth daily at 12 noon.   08/14/2017 at Unknown time    Review of Systems  Constitutional: Negative.   HENT: Negative.   Cardiovascular: Positive for leg swelling.  Musculoskeletal: Positive for back pain.  Neurological: Positive for headaches. Negative for dizziness.  All other systems reviewed and are negative.  Physical Exam   Blood pressure (!) 99/54, pulse 92, temperature 98.1 F (36.7 C), temperature source Oral, resp. rate 20, last menstrual period 01/31/2017, SpO2 98 %.  Physical Exam  Nursing note and vitals reviewed. Constitutional: She is oriented to person, place, and time. She appears well-developed and well-nourished.  HENT:  Head: Normocephalic.  Eyes: Pupils are equal, round, and reactive to light. Conjunctivae are normal.  Neck: Normal range of motion.  Cardiovascular: Normal rate,  regular rhythm and normal heart sounds.  Respiratory: Effort normal.  GI: Soft.  Genitourinary: Vagina normal.  Genitourinary Comments: Gravida uterus Cervical exam: closed/thick/high   Musculoskeletal: Normal range of motion.  Neurological: She is alert and oriented to person, place, and time. She has normal reflexes.  Skin: Skin is warm and dry.  Psychiatric: She has a normal mood and affect.    MAU Course  Procedures  MDM: Pre-eclamptic labs WNL, NST WNL, BP WNL, pt to be d/c to home and F?U for weeks BBPs started in office per Dr Mora Appl  Assessment and Plan   Rule out preeclampsia.  Cat 1 fetal strips.  D/C to home with  Pre-eclampsia precautions F/U at [facility] for weekly BBPs.  [place], JADE 09/21/2017, 6:28 PM

## 2017-09-21 NOTE — MAU Note (Addendum)
Pt sent from office for BP eval.  States was seen in office yesterday and "spilling a lot protein".  Reports has had H/A and visual disturbances and swelling.  Currently denies H/A, epigastric pain, or visual disturbances. Reflexes 2+, clonus -

## 2017-09-29 ENCOUNTER — Encounter: Payer: Self-pay | Admitting: Neurology

## 2017-10-09 ENCOUNTER — Inpatient Hospital Stay (HOSPITAL_COMMUNITY)
Admission: AD | Admit: 2017-10-09 | Discharge: 2017-10-10 | Disposition: A | Discharge status: Home / Self Care | Payer: 59 | Source: Ambulatory Visit | Attending: Obstetrics & Gynecology | Admitting: Obstetrics & Gynecology

## 2017-10-09 DIAGNOSIS — R1031 Right lower quadrant pain: Secondary | ICD-10-CM | POA: Diagnosis not present

## 2017-10-09 DIAGNOSIS — O99343 Other mental disorders complicating pregnancy, third trimester: Secondary | ICD-10-CM | POA: Diagnosis not present

## 2017-10-09 DIAGNOSIS — R1032 Left lower quadrant pain: Secondary | ICD-10-CM | POA: Diagnosis not present

## 2017-10-09 DIAGNOSIS — F909 Attention-deficit hyperactivity disorder, unspecified type: Secondary | ICD-10-CM | POA: Diagnosis not present

## 2017-10-09 DIAGNOSIS — Z79899 Other long term (current) drug therapy: Secondary | ICD-10-CM | POA: Diagnosis not present

## 2017-10-09 DIAGNOSIS — Z882 Allergy status to sulfonamides status: Secondary | ICD-10-CM | POA: Insufficient documentation

## 2017-10-09 DIAGNOSIS — Z3A35 35 weeks gestation of pregnancy: Secondary | ICD-10-CM | POA: Insufficient documentation

## 2017-10-09 DIAGNOSIS — O26893 Other specified pregnancy related conditions, third trimester: Secondary | ICD-10-CM | POA: Diagnosis not present

## 2017-10-09 DIAGNOSIS — Z8279 Family history of other congenital malformations, deformations and chromosomal abnormalities: Secondary | ICD-10-CM

## 2017-10-09 NOTE — MAU Note (Signed)
I have been having ctxs for 2 days off and on. Tonight ctxs about 10-39mins apart. Decreased FM earlier tonight but drank some coffee and since then good FM. Came in due to ctxs getting closer. Denies LOF or bleeding. Cervix closed last sve

## 2017-10-10 ENCOUNTER — Encounter (HOSPITAL_COMMUNITY): Payer: Self-pay | Admitting: *Deleted

## 2017-10-10 DIAGNOSIS — Z3A35 35 weeks gestation of pregnancy: Secondary | ICD-10-CM

## 2017-10-10 LAB — URINALYSIS, ROUTINE W REFLEX MICROSCOPIC
Bilirubin Urine: NEGATIVE
Glucose, UA: NEGATIVE mg/dL
Hgb urine dipstick: NEGATIVE
Ketones, ur: NEGATIVE mg/dL
Leukocytes, UA: NEGATIVE
Nitrite: NEGATIVE
Protein, ur: NEGATIVE mg/dL
Specific Gravity, Urine: 1.006 (ref 1.005–1.030)
pH: 7 (ref 5.0–8.0)

## 2017-10-10 MED ORDER — NIFEDIPINE 10 MG PO CAPS
10.0000 mg | ORAL_CAPSULE | ORAL | Status: DC | PRN
  Filled 2017-10-10: qty 1

## 2017-10-10 NOTE — Progress Notes (Signed)
Baby active and pt aware 

## 2017-10-10 NOTE — Discharge Instructions (Signed)

## 2017-10-10 NOTE — MAU Provider Note (Signed)
Chief Complaint:  Contractions   First Provider Initiated Contact with Patient 10/10/17 0018     HPI: Cassidy Myers is a 22 y.o. G1P0 at 40w1dwho presents to maternity admissions reporting painful uterine contractions for 2 days.  Is hoping baby will be born early. States has been having so much pain, she is tired of it.  She reports good fetal movement, denies LOF, vaginal bleeding, vaginal itching/burning, urinary symptoms, h/a, dizziness, n/v, diarrhea, constipation or fever/chills.    Abdominal Pain  This is a new problem. The current episode started in the past 7 days. The problem occurs intermittently. The problem has been unchanged. The pain is located in the suprapubic region, LLQ and RLQ. The quality of the pain is cramping. The abdominal pain radiates to the back. Associated symptoms include nausea. Pertinent negatives include no constipation, diarrhea, dysuria, fever, frequency, headaches, myalgias or vomiting. Nothing aggravates the pain. The pain is relieved by nothing. She has tried nothing for the symptoms.   RN Note: I have been having ctxs for 2 days off and on. Tonight ctxs about 10-51mins apart. Decreased FM earlier tonight but drank some coffee and since then good FM. Came in due to ctxs getting closer. Denies LOF or bleeding. Cervix closed last sve    Past Medical History: Past Medical History:  Diagnosis Date  . ADHD   . Allergy   . Anxiety   . Dysautonomia (HCC)   . Ehlers-Danlos syndrome   . Headache(784.0)     Past obstetric history: OB History  Gravida Para Term Preterm AB Living  1            SAB TAB Ectopic Multiple Live Births               # Outcome Date GA Lbr Len/2nd Weight Sex Delivery Anes PTL Lv  1 Current             Past Surgical History: Past Surgical History:  Procedure Laterality Date  . NO PAST SURGERIES    . WISDOM TOOTH EXTRACTION      Family History: Family History  Problem Relation Age of Onset  . Cancer Mother    Cervical  . Asthma Mother   . Depression Mother   . Depression Father   . Schizophrenia Father   . Depression Maternal Grandmother   . Depression Maternal Grandfather   . Depression Paternal Grandmother   . Diabetes Paternal Grandmother   . Depression Paternal Grandfather   . Diabetes Paternal Grandfather     Social History: Social History   Tobacco Use  . Smoking status: Never Smoker  . Smokeless tobacco: Never Used  Substance Use Topics  . Alcohol use: No  . Drug use: No    Allergies:  Allergies  Allergen Reactions  . Sulfa Antibiotics Nausea And Vomiting    Meds:  Medications Prior to Admission  Medication Sig Dispense Refill Last Dose  . acetaminophen (TYLENOL) 500 MG tablet Take 500-1,000 mg by mouth every 6 (six) hours as needed for mild pain.   Past Week at Unknown time  . famotidine (PEPCID) 20 MG tablet Take 20 mg by mouth 2 (two) times daily.   10/09/2017 at Unknown time  . oxyCODONE (OXY IR/ROXICODONE) 5 MG immediate release tablet Take 5 mg by mouth every 4 (four) hours as needed for severe pain.   Past Month at Unknown time  . Prenatal Vit-Fe Fumarate-FA (PRENATAL MULTIVITAMIN) TABS tablet Take 1 tablet by mouth daily at 12 noon.  10/09/2017 at Unknown time  . Doxylamine-Pyridoxine (DICLEGIS PO) Take 1 tablet by mouth daily as needed (For nausea.).    Past Week at Unknown time    I have reviewed patient's Past Medical Hx, Surgical Hx, Family Hx, Social Hx, medications and allergies.   ROS:  Review of Systems  Constitutional: Negative for fever.  Gastrointestinal: Positive for abdominal pain and nausea. Negative for constipation, diarrhea and vomiting.  Genitourinary: Negative for dysuria and frequency.  Musculoskeletal: Negative for myalgias.  Neurological: Negative for headaches.   Other systems negative  Physical Exam   Patient Vitals for the past 24 hrs:  BP Temp Pulse Resp Height Weight  10/09/17 2353 105/66 - 91 - - -  10/09/17 2352 - 97.6 F  (36.4 C) - 18  (1.626 m) 195 lb (88.5 kg)   Constitutional: Well-developed, well-nourished female in no acute distress.  Cardiovascular: normal rate and rhythm Respiratory: normal effort, clear to auscultation bilaterally GI: Abd soft, non-tender, gravid appropriate for gestational age.   No rebound or guarding. MS: Extremities nontender, no edema, normal ROM Neurologic: Alert and oriented x 4.  GU: Neg CVAT.  PELVIC EXAM: Dilation: Closed Effacement (%): Thick Station: Ballotable Exam by:: Wynelle Bourgeois CNM  FHT:  Baseline 130 , moderate variability, accelerations present, no decelerations Contractions: q 2-4 mins Irregular     Labs: Results for orders placed or performed during the hospital encounter of 10/09/17 (from the past 24 hour(s))  Urinalysis, Routine w reflex microscopic     Status: Abnormal   Collection Time: 10/09/17 11:58 PM  Result Value Ref Range   Color, Urine STRAW (A) YELLOW   APPearance CLEAR CLEAR   Specific Gravity, Urine 1.006 1.005 - 1.030   pH 7.0 5.0 - 8.0   Glucose, UA NEGATIVE NEGATIVE mg/dL   Hgb urine dipstick NEGATIVE NEGATIVE   Bilirubin Urine NEGATIVE NEGATIVE   Ketones, ur NEGATIVE NEGATIVE mg/dL   Protein, ur NEGATIVE NEGATIVE mg/dL   Nitrite NEGATIVE NEGATIVE   Leukocytes, UA NEGATIVE NEGATIVE      Imaging:  No results found.  MAU Course/MDM: I have ordered labs and reviewed results. UA is normal  NST reviewed and is reactive.   Consult Dr Mora Appl with presentation, exam findings and test results.  Treatments in MAU included Procardia ordered  But patient refused it.  States she does not want to stop these contractions, that she wants to deliver early.  Discussed risks to baby related to prematurity, states she still wants to deliver early and does not want to stop preterm labor Recheck of cervix showed no change.  Will discharge home with PTL precautions. .     Assessment: Single IUP at [redacted]w[redacted]d Preterm uterine contractions  without cervical change Refused tocolysis  Plan: Discharge home Preterm Labor precautions and fetal kick counts Follow up in Office for prenatal visits and recheck of cervix  Encouraged to return here or to other Urgent Care/ED if she develops worsening of symptoms, increase in pain, fever, or other concerning symptoms.   Pt stable at time of discharge.  Wynelle Bourgeois CNM, MSN Certified Nurse-Midwife 10/10/2017 12:18 AM

## 2017-10-10 NOTE — Progress Notes (Signed)
Up to BR.

## 2017-10-11 ENCOUNTER — Other Ambulatory Visit: Payer: Self-pay

## 2017-10-11 ENCOUNTER — Inpatient Hospital Stay (HOSPITAL_COMMUNITY)
Admission: AD | Admit: 2017-10-11 | Discharge: 2017-10-11 | Disposition: A | Discharge status: Home / Self Care | Payer: 59 | Source: Ambulatory Visit | Attending: Obstetrics and Gynecology | Admitting: Obstetrics and Gynecology

## 2017-10-11 ENCOUNTER — Encounter (HOSPITAL_COMMUNITY): Payer: Self-pay | Admitting: *Deleted

## 2017-10-11 ENCOUNTER — Inpatient Hospital Stay (HOSPITAL_COMMUNITY): Payer: 59

## 2017-10-11 DIAGNOSIS — F909 Attention-deficit hyperactivity disorder, unspecified type: Secondary | ICD-10-CM | POA: Insufficient documentation

## 2017-10-11 DIAGNOSIS — O4703 False labor before 37 completed weeks of gestation, third trimester: Secondary | ICD-10-CM | POA: Insufficient documentation

## 2017-10-11 DIAGNOSIS — Z91040 Latex allergy status: Secondary | ICD-10-CM | POA: Insufficient documentation

## 2017-10-11 DIAGNOSIS — Z3A35 35 weeks gestation of pregnancy: Secondary | ICD-10-CM | POA: Diagnosis not present

## 2017-10-11 DIAGNOSIS — O99343 Other mental disorders complicating pregnancy, third trimester: Secondary | ICD-10-CM | POA: Insufficient documentation

## 2017-10-11 DIAGNOSIS — Z79899 Other long term (current) drug therapy: Secondary | ICD-10-CM | POA: Diagnosis not present

## 2017-10-11 DIAGNOSIS — Z882 Allergy status to sulfonamides status: Secondary | ICD-10-CM | POA: Insufficient documentation

## 2017-10-11 DIAGNOSIS — Z8279 Family history of other congenital malformations, deformations and chromosomal abnormalities: Secondary | ICD-10-CM

## 2017-10-11 LAB — URINALYSIS, ROUTINE W REFLEX MICROSCOPIC
Bilirubin Urine: NEGATIVE
Glucose, UA: NEGATIVE mg/dL
Hgb urine dipstick: NEGATIVE
Ketones, ur: NEGATIVE mg/dL
Nitrite: NEGATIVE
Protein, ur: NEGATIVE mg/dL
RBC / HPF: >50 RBC/hpf — ABNORMAL HIGH (ref 0–5)
Specific Gravity, Urine: 1.016 (ref 1.005–1.030)
pH: 6 (ref 5.0–8.0)

## 2017-10-11 NOTE — Progress Notes (Signed)
Pt has agreed to further EFM, EFM reapplied by L. Clelia Croft, RN.

## 2017-10-11 NOTE — Progress Notes (Signed)
Assumed care from Dicky Doe, RN.  Pt on EFM.  Waiting for BPP.

## 2017-10-11 NOTE — Discharge Instructions (Signed)

## 2017-10-11 NOTE — MAU Provider Note (Signed)
History     CSN: [id number]  Arrival date and time: 10/11/17 1434   None     Chief Complaint  Patient presents with  . Contractions   HPI G1 P0 at 35 wks and 2 days presents complaining of contractions every 7 minutes. She was seen on 10/10/2017 with preterm contraction . Her cervix was closed at that time. She was offered tocolysis however refused. She denies Leakage of fluid or vaginal bleeding. + FM.   OB History    Gravida  1   Para      Term      Preterm      AB      Living        SAB      TAB      Ectopic      Multiple      Live Births              Past Medical History:  Diagnosis Date  . ADHD   . Allergy   . Anxiety   . Dysautonomia (HCC)   . Ehlers-Danlos syndrome   . WUJWJXBJ(478.2)     Past Surgical History:  Procedure Laterality Date  . NO PAST SURGERIES    . WISDOM TOOTH EXTRACTION      Family History  Problem Relation Age of Onset  . Cancer Mother        Cervical  . Asthma Mother   . Depression Mother   . Depression Father   . Schizophrenia Father   . Depression Maternal Grandmother   . Depression Maternal Grandfather   . Depression Paternal Grandmother   . Diabetes Paternal Grandmother   . Depression Paternal Grandfather   . Diabetes Paternal Grandfather     Social History   Tobacco Use  . Smoking status: Never Smoker  . Smokeless tobacco: Never Used  Substance Use Topics  . Alcohol use: No  . Drug use: No    Allergies:  Allergies  Allergen Reactions  . Latex Itching and Other (See Comments)    Redness.  . Sulfa Antibiotics Nausea And Vomiting    Medications Prior to Admission  Medication Sig Dispense Refill Last Dose  . acetaminophen (TYLENOL) 500 MG tablet Take 500-1,000 mg by mouth every 6 (six) hours as needed for mild pain.   Past Week at Unknown time  . famotidine (PEPCID) 20 MG tablet Take 20 mg by mouth 2 (two) times daily.   Past Week at Unknown time  . Prenatal Vit-Fe Fumarate-FA (PRENATAL  MULTIVITAMIN) TABS tablet Take 1 tablet by mouth daily at 12 noon.   Past Week at Unknown time    Review of Systems Physical Exam   Blood pressure 105/66, pulse (!) 113, temperature 97.6 F (36.4 C), temperature source Oral, resp. rate 18, height  (1.626 m), weight 88.1 kg (194 lb 4 oz), last menstrual period 01/31/2017, SpO2 99 %.  Physical Exam General Alert and oriented NAD  Lungs good effort  Abdomen gravid nontender  Cervix closed thick and high  Ext: trace edema  FHR  Baseline 130's accelerations present.. Strip interrupted by drop of to maternal HR when pt is sitting up right.   MAU Course  Procedures   Assessment and Plan  35 wk and 2 day with preterm contractions/ false labor  Offered tocolysis however patient refused  Suggested monitoring fetal heart rate for 15-20 minutes more prior to discharge given drop of to maternal hear rate. Pt disconnected herself from the monitors and  stated that she just wants to go home.    COLE,TARA J. 10/11/2017, 3:39 PM

## 2017-10-11 NOTE — MAU Note (Signed)
Pt presents with c/o ctxs since Saturday.  Reports ctxs have been intermittent throughout weekend but became regular at 1130 this morning.  Pt reports ctxs are currently 7 minutes apart.  Denies LOF, reports pinkish vaginal discharge.  Reports +FM. Reports seen in MAU Saturday, cervix not dilated.

## 2017-10-11 NOTE — Progress Notes (Signed)
EFM removed by pt despite Dr. Richardson Dopp suggesting EFM remain on secondary FHR tracing sketchy @ times & tracing maternal pulse.

## 2017-10-11 NOTE — Progress Notes (Signed)
Deetta Perla, RN, House Coverage notified regarding pt request to speak to management.

## 2017-10-11 NOTE — Progress Notes (Signed)
Pt observed dressed and standing in room, states "I just want to go home."  Pt informed RN must obtain VS prior to discharge, pt refused, states she will have them checked tomorrow @ her Dr.'s appointment.  Pt noted to be teary, requesting to speak to management. Pt's mother states pt is frustrated and doesn't think her complaints are being taken seriously.  Pt & her informed House Coverage to be notified regarding their request to speak to management.

## 2017-10-11 NOTE — Progress Notes (Signed)
Deetta Perla, RN, House coverage into see pt & her mother.

## 2017-10-20 ENCOUNTER — Other Ambulatory Visit: Payer: Self-pay

## 2017-10-20 ENCOUNTER — Encounter (HOSPITAL_COMMUNITY): Payer: Self-pay

## 2017-10-20 ENCOUNTER — Inpatient Hospital Stay (HOSPITAL_COMMUNITY)
Admission: AD | Admit: 2017-10-20 | Discharge: 2017-10-20 | Disposition: A | Discharge status: Home / Self Care | Payer: 59 | Source: Ambulatory Visit | Attending: Obstetrics and Gynecology | Admitting: Obstetrics and Gynecology

## 2017-10-20 DIAGNOSIS — Z3A36 36 weeks gestation of pregnancy: Secondary | ICD-10-CM | POA: Insufficient documentation

## 2017-10-20 DIAGNOSIS — Z818 Family history of other mental and behavioral disorders: Secondary | ICD-10-CM | POA: Insufficient documentation

## 2017-10-20 DIAGNOSIS — O403XX0 Polyhydramnios, third trimester, not applicable or unspecified: Secondary | ICD-10-CM | POA: Insufficient documentation

## 2017-10-20 DIAGNOSIS — O99343 Other mental disorders complicating pregnancy, third trimester: Secondary | ICD-10-CM | POA: Diagnosis not present

## 2017-10-20 DIAGNOSIS — O368130 Decreased fetal movements, third trimester, not applicable or unspecified: Secondary | ICD-10-CM | POA: Insufficient documentation

## 2017-10-20 DIAGNOSIS — F419 Anxiety disorder, unspecified: Secondary | ICD-10-CM | POA: Insufficient documentation

## 2017-10-20 DIAGNOSIS — F909 Attention-deficit hyperactivity disorder, unspecified type: Secondary | ICD-10-CM | POA: Insufficient documentation

## 2017-10-20 DIAGNOSIS — O368131 Decreased fetal movements, third trimester, fetus 1: Secondary | ICD-10-CM

## 2017-10-20 DIAGNOSIS — Q796 Ehlers-Danlos syndrome: Secondary | ICD-10-CM | POA: Diagnosis not present

## 2017-10-20 NOTE — Progress Notes (Addendum)
G1 @ 36.4 wksa. Here for decreased FM. Denies LOF or bleeding. EFM applied.   1230: provider notified. Report status of pt given. Orders received to discharge pt home with labor precaution.   1240: provider at bs assessing VE: closed   D/c instructions given with pt understanding.

## 2017-10-20 NOTE — MAU Note (Signed)
History     CSN: [id number]  Arrival date and time: 10/20/17 1141   None     Chief Complaint  Patient presents with  . Decreased Fetal Movement   21yo with an IUP @ 36.[redacted] weeks gestation, G1P0, presented to the [facility] for received decreased fetal movement. Pt endorses feeling + FM 10 times a day, but decreased over last 2 days. Pt denies vaginal leakage, bleeding, or dysuria. Pt has PMH that complicates pregnancy with : EDS and polyhydramnios. Pt being monitor for that. Pt endorses feeling increased sharp pain in pelvic area and endorses wanting to be check, pt has worry that she wil lgo into labor and not be aware of it.    Past Medical History:  Diagnosis Date  . ADHD   . Allergy   . Anxiety   . Dysautonomia (HCC)   . Ehlers-Danlos syndrome   . WUJWJXBJ(478.2)     Past Surgical History:  Procedure Laterality Date  . NO PAST SURGERIES    . WISDOM TOOTH EXTRACTION      Family History  Problem Relation Age of Onset  . Cancer Mother        Cervical  . Asthma Mother   . Depression Mother   . Depression Father   . Schizophrenia Father   . Depression Maternal Grandmother   . Depression Maternal Grandfather   . Depression Paternal Grandmother   . Diabetes Paternal Grandmother   . Depression Paternal Grandfather   . Diabetes Paternal Grandfather     Social History   Tobacco Use  . Smoking status: Never Smoker  . Smokeless tobacco: Never Used  Substance Use Topics  . Alcohol use: No  . Drug use: No    Allergies:  Allergies  Allergen Reactions  . Latex Itching and Other (See Comments)    Redness.  . Sulfa Antibiotics Nausea And Vomiting    Medications Prior to Admission  Medication Sig Dispense Refill Last Dose  . acetaminophen (TYLENOL) 500 MG tablet Take 500-1,000 mg by mouth every 6 (six) hours as needed for mild pain.   Past Week at Unknown time  . famotidine (PEPCID) 20 MG tablet Take 20 mg by mouth 2 (two) times daily.   Past Week at Unknown time  .  Prenatal Vit-Fe Fumarate-FA (PRENATAL MULTIVITAMIN) TABS tablet Take 1 tablet by mouth daily at 12 noon.   Past Week at Unknown time    Review of Systems  Genitourinary: Positive for pelvic pain.  All other systems reviewed and are negative.  Physical Exam   Blood pressure 114/72, pulse 88, temperature 97.9 F (36.6 C), temperature source Oral, resp. rate 16, height 5\' 4"  (1.626 m), weight 91.1 kg (200 lb 12.8 oz), last menstrual period 01/31/2017.  Physical Exam  Constitutional: She is oriented to person, place, and time. She appears well-developed and well-nourished.  HENT:  Head: Normocephalic and atraumatic.  Eyes: Pupils are equal, round, and reactive to light.  Cardiovascular: Normal rate and regular rhythm.  Respiratory: Effort normal.  Genitourinary: Rectum normal, vagina normal and uterus normal. Pelvic exam was performed with patient prone.  Genitourinary Comments: SVE: 0/thick /long.   Musculoskeletal: Normal range of motion.  Neurological: She is alert and oriented to person, place, and time.   Most Recent FHR    Most Recent Value  Fetal Heart Rate A  Mode External filed at 10/20/2017 1210  Baseline Rate (A) 125 bpm filed at 10/20/2017 1210  Variability 6-25 BPM filed at 10/20/2017 1210  Accelerations 15  x 15 filed at 10/20/2017 1210  Decelerations None filed at 10/20/2017 1210  Multiple birth? N filed at 10/20/2017 1210  Fetal Heart Rate Fetus B  Fetal Heart Rate Fetus C    Most Recent SVE    Most Recent Value  Cervical Exam  Dilation 0 filed at 10/20/2017 1247  Effacement (%) Thick filed at 10/20/2017 1247  Vag. Bleeding None filed at 10/20/2017 1153  Exam by: [facility] [place] filed at 10/20/2017 1247  Vaginal Bleeding  Vag. Bleeding None filed at 10/20/2017 1153  Membranes  Membrane Status Intact filed at 10/20/2017 1210  Amount None filed at 10/20/2017 1153     PRN Medications  (From admission, onward)  None  Cxt: None  MAU Course   Procedures  MDM: NST reactive, reassurance, d/c home  Assessment and Plan  21yo with an IUP @ 36.[redacted] weeks gestation, G1P0, [facility] received decreased fetal movement and polyhydramnios. Reactive NST. Cat 1 fetal tracing.   Decreased fetal movement: Eat something sugary, lying down and count kick if unable to count 10 kicks in 2 hours call. Kick counts reviewed, PTL reviewed.    MONTANA, JADE 10/20/2017, 12:59 PM

## 2017-10-21 ENCOUNTER — Ambulatory Visit: Payer: 59 | Admitting: Internal Medicine

## 2017-11-01 ENCOUNTER — Encounter (HOSPITAL_COMMUNITY): Payer: Self-pay | Admitting: *Deleted

## 2017-11-01 ENCOUNTER — Inpatient Hospital Stay (HOSPITAL_COMMUNITY)
Admission: AD | Admit: 2017-11-01 | Discharge: 2017-11-01 | Disposition: A | Discharge status: Home / Self Care | Payer: 59 | Source: Ambulatory Visit | Attending: Obstetrics and Gynecology | Admitting: Obstetrics and Gynecology

## 2017-11-01 DIAGNOSIS — O471 False labor at or after 37 completed weeks of gestation: Secondary | ICD-10-CM

## 2017-11-01 DIAGNOSIS — O26893 Other specified pregnancy related conditions, third trimester: Secondary | ICD-10-CM | POA: Insufficient documentation

## 2017-11-01 DIAGNOSIS — Z3A39 39 weeks gestation of pregnancy: Secondary | ICD-10-CM | POA: Insufficient documentation

## 2017-11-01 DIAGNOSIS — Z8279 Family history of other congenital malformations, deformations and chromosomal abnormalities: Secondary | ICD-10-CM

## 2017-11-04 ENCOUNTER — Telehealth (HOSPITAL_COMMUNITY): Payer: Self-pay | Admitting: *Deleted

## 2017-11-04 NOTE — Telephone Encounter (Signed)
Preadmission screen  

## 2017-11-05 ENCOUNTER — Other Ambulatory Visit: Payer: Self-pay | Admitting: Obstetrics & Gynecology

## 2017-11-07 ENCOUNTER — Inpatient Hospital Stay (HOSPITAL_COMMUNITY): Payer: 59 | Admitting: Anesthesiology

## 2017-11-07 ENCOUNTER — Inpatient Hospital Stay (HOSPITAL_COMMUNITY)
Admission: RE | Admit: 2017-11-07 | Discharge: 2017-11-11 | DRG: 787 | Disposition: A | Discharge status: Home / Self Care | Payer: 59 | Attending: Obstetrics and Gynecology | Admitting: Obstetrics and Gynecology

## 2017-11-07 ENCOUNTER — Encounter (HOSPITAL_COMMUNITY): Payer: Self-pay

## 2017-11-07 ENCOUNTER — Other Ambulatory Visit: Payer: Self-pay

## 2017-11-07 DIAGNOSIS — O9902 Anemia complicating childbirth: Secondary | ICD-10-CM | POA: Diagnosis present

## 2017-11-07 DIAGNOSIS — O403XX0 Polyhydramnios, third trimester, not applicable or unspecified: Secondary | ICD-10-CM | POA: Diagnosis present

## 2017-11-07 DIAGNOSIS — D509 Iron deficiency anemia, unspecified: Secondary | ICD-10-CM | POA: Diagnosis present

## 2017-11-07 DIAGNOSIS — Q796 Ehlers-Danlos syndrome, unspecified: Secondary | ICD-10-CM

## 2017-11-07 DIAGNOSIS — Z3A39 39 weeks gestation of pregnancy: Secondary | ICD-10-CM

## 2017-11-07 DIAGNOSIS — O3663X0 Maternal care for excessive fetal growth, third trimester, not applicable or unspecified: Secondary | ICD-10-CM | POA: Diagnosis present

## 2017-11-07 DIAGNOSIS — Z8279 Family history of other congenital malformations, deformations and chromosomal abnormalities: Secondary | ICD-10-CM

## 2017-11-07 HISTORY — DX: Depression, unspecified: F32.A

## 2017-11-07 HISTORY — DX: Major depressive disorder, single episode, unspecified: F32.9

## 2017-11-07 LAB — CBC
HCT: 26.9 % — ABNORMAL LOW (ref 36.0–46.0)
Hemoglobin: 8.5 g/dL — ABNORMAL LOW (ref 12.0–15.0)
MCH: 25.8 pg — ABNORMAL LOW (ref 26.0–34.0)
MCHC: 31.6 g/dL (ref 30.0–36.0)
MCV: 81.5 fL (ref 78.0–100.0)
Platelets: 314 10*3/uL (ref 150–400)
RBC: 3.3 MIL/uL — ABNORMAL LOW (ref 3.87–5.11)
RDW: 13.7 % (ref 11.5–15.5)
WBC: 10.3 10*3/uL (ref 4.0–10.5)

## 2017-11-07 LAB — RPR: RPR Ser Ql: NONREACTIVE

## 2017-11-07 LAB — ABO/RH: ABO/RH(D): O NEG

## 2017-11-07 MED ORDER — TERBUTALINE SULFATE 1 MG/ML IJ SOLN: 0.2500 mg | Freq: Once | INTRAMUSCULAR | Status: DC | PRN

## 2017-11-07 MED ORDER — FENTANYL CITRATE (PF) 100 MCG/2ML IJ SOLN
50.0000 ug | INTRAMUSCULAR | Status: DC | PRN
  Administered 2017-11-07 (×2): 100 ug via INTRAVENOUS
  Filled 2017-11-07 (×2): qty 2

## 2017-11-07 MED ORDER — LACTATED RINGERS IV SOLN: 500.0000 mL | Freq: Once | INTRAVENOUS | Status: DC

## 2017-11-07 MED ORDER — OXYTOCIN 40 UNITS IN LACTATED RINGERS INFUSION - SIMPLE MED: 1.0000 m[IU]/min | INTRAVENOUS | Status: DC

## 2017-11-07 MED ORDER — PHENYLEPHRINE 40 MCG/ML (10ML) SYRINGE FOR IV PUSH (FOR BLOOD PRESSURE SUPPORT)
80.0000 ug | PREFILLED_SYRINGE | INTRAVENOUS | Status: DC | PRN
  Filled 2017-11-07: qty 10

## 2017-11-07 MED ORDER — LACTATED RINGERS IV SOLN
INTRAVENOUS | Status: DC
  Administered 2017-11-07 – 2017-11-08 (×6): via INTRAVENOUS

## 2017-11-07 MED ORDER — OXYTOCIN 40 UNITS IN LACTATED RINGERS INFUSION - SIMPLE MED: 2.5000 [IU]/h | INTRAVENOUS | Status: DC

## 2017-11-07 MED ORDER — MISOPROSTOL 25 MCG QUARTER TABLET
25.0000 ug | ORAL_TABLET | ORAL | Status: DC | PRN
  Administered 2017-11-07: 25 ug via VAGINAL
  Filled 2017-11-07 (×2): qty 1

## 2017-11-07 MED ORDER — EPHEDRINE 5 MG/ML INJ: 10.0000 mg | INTRAVENOUS | Status: DC | PRN

## 2017-11-07 MED ORDER — SOD CITRATE-CITRIC ACID 500-334 MG/5ML PO SOLN
30.0000 mL | ORAL | Status: DC | PRN
  Administered 2017-11-08: 30 mL via ORAL
  Filled 2017-11-07: qty 15

## 2017-11-07 MED ORDER — ACETAMINOPHEN 325 MG PO TABS: 650.0000 mg | ORAL_TABLET | ORAL | Status: DC | PRN

## 2017-11-07 MED ORDER — FENTANYL 2.5 MCG/ML BUPIVACAINE 1/10 % EPIDURAL INFUSION (WH - ANES)
14.0000 mL/h | INTRAMUSCULAR | Status: DC | PRN
  Administered 2017-11-07 – 2017-11-08 (×4): 14 mL/h via EPIDURAL
  Filled 2017-11-07 (×4): qty 100

## 2017-11-07 MED ORDER — LIDOCAINE HCL (PF) 1 % IJ SOLN: 30.0000 mL | INTRAMUSCULAR | Status: DC | PRN

## 2017-11-07 MED ORDER — LIDOCAINE HCL (PF) 1 % IJ SOLN
INTRAMUSCULAR | Status: DC | PRN
  Administered 2017-11-07 (×2): 5 mL via EPIDURAL

## 2017-11-07 MED ORDER — OXYTOCIN 40 UNITS IN LACTATED RINGERS INFUSION - SIMPLE MED
1.0000 m[IU]/min | INTRAVENOUS | Status: DC
  Administered 2017-11-07: 2 m[IU]/min via INTRAVENOUS
  Filled 2017-11-07: qty 1000

## 2017-11-07 MED ORDER — DIPHENHYDRAMINE HCL 50 MG/ML IJ SOLN
12.5000 mg | INTRAMUSCULAR | Status: AC | PRN
  Administered 2017-11-07 – 2017-11-08 (×3): 12.5 mg via INTRAVENOUS
  Filled 2017-11-07 (×3): qty 1

## 2017-11-07 MED ORDER — FLEET ENEMA 7-19 GM/118ML RE ENEM: 1.0000 | ENEMA | RECTAL | Status: DC | PRN

## 2017-11-07 MED ORDER — ONDANSETRON HCL 4 MG/2ML IJ SOLN
4.0000 mg | Freq: Four times a day (QID) | INTRAMUSCULAR | Status: DC | PRN
  Administered 2017-11-07 – 2017-11-08 (×3): 4 mg via INTRAVENOUS
  Filled 2017-11-07 (×2): qty 2

## 2017-11-07 MED ORDER — LACTATED RINGERS IV SOLN
500.0000 mL | INTRAVENOUS | Status: DC | PRN
  Administered 2017-11-07: 500 mL via INTRAVENOUS

## 2017-11-07 MED ORDER — OXYTOCIN BOLUS FROM INFUSION: 500.0000 mL | Freq: Once | INTRAVENOUS | Status: DC

## 2017-11-07 MED ORDER — PHENYLEPHRINE 40 MCG/ML (10ML) SYRINGE FOR IV PUSH (FOR BLOOD PRESSURE SUPPORT): 80.0000 ug | PREFILLED_SYRINGE | INTRAVENOUS | Status: DC | PRN

## 2017-11-07 NOTE — Anesthesia Procedure Notes (Signed)
Epidural Patient location during procedure: OB  Staffing Anesthesiologist: Carignan, Peter, MD Performed: anesthesiologist   Preanesthetic Checklist Completed: patient identified, site marked, surgical consent, pre-op evaluation, timeout performed, IV checked, risks and benefits discussed and monitors and equipment checked  Epidural Patient position: sitting Prep: DuraPrep Patient monitoring: heart rate, continuous pulse ox and blood pressure Approach: right paramedian Location: L3-L4 Injection technique: LOR saline  Needle:  Needle type: Tuohy  Needle gauge: 17 G Needle length: 9 cm and 9 Needle insertion depth: 6 cm Catheter type: closed end flexible Catheter size: 20 Guage Catheter at skin depth: 10 cm Test dose: negative  Assessment Events: blood not aspirated, injection not painful, no injection resistance, negative IV test and no paresthesia  Additional Notes Patient identified. Risks/Benefits/Options discussed with patient including but not limited to bleeding, infection, nerve damage, paralysis, failed block, incomplete pain control, headache, blood pressure changes, nausea, vomiting, reactions to medication both or allergic, itching and postpartum back pain. Confirmed with bedside nurse the patient's most recent platelet count. Confirmed with patient that they are not currently taking any anticoagulation, have any bleeding history or any family history of bleeding disorders. Patient expressed understanding and wished to proceed. All questions were answered. Sterile technique was used throughout the entire procedure. Please see nursing notes for vital signs. Test dose was given through epidural needle and negative prior to continuing to dose epidural or start infusion. Warning signs of high block given to the patient including shortness of breath, tingling/numbness in hands, complete motor block, or any concerning symptoms with instructions to call for help. Patient was given  instructions on fall risk and not to get out of bed. All questions and concerns addressed with instructions to call with any issues.     

## 2017-11-07 NOTE — Progress Notes (Addendum)
S: Rn called and stated that the patient was not handling the pain of contractions well at all. She has had 3 doses of fentanyl and she was "out of control". The patient requests to see me and tells the RN that she does not want to do this anymore. She wants to have a C/S.  O: BP 115/60   Pulse 72   Temp 98.5 F (36.9 C) (Oral)   Resp 18   Ht 5\' 4"  (1.626 m)   Wt 208 lb 11.2 oz (94.7 kg)   LMP 01/31/2017 Comment: currently pregnant     BMI 35.82 kg/m  FHT's Cat 1 uc's q 1-2 minutes SVE- 1/40/-3 Results for orders placed or performed during the hospital encounter of 11/07/17 (from the past 24 hour(s))  Type and screen     Status: None   Collection Time: 11/07/17 12:22 AM  Result Value Ref Range   ABO/RH(D) O NEG    Antibody Screen NEG    Sample Expiration      11/10/2017 Performed at [facility], [address] [place], [place] [address]   CBC     Status: Abnormal   Collection Time: 11/07/17  1:00 AM  Result Value Ref Range   WBC 10.3 4.0 - 10.5 Cassidy/uL   RBC 3.30 (L) 3.87 - 5.11 MIL/uL   Hemoglobin 8.5 (L) 12.0 - 15.0 g/dL   HCT 60.426.9 (L) 54.036.0 - 98.146.0 %   MCV 81.5 78.0 - 100.0 fL   MCH 25.8 (L) 26.0 - 34.0 pg   MCHC 31.6 30.0 - 36.0 g/dL   RDW 19.113.7 47.811.5 - 29.515.5 %   Platelets 314 150 - 400 Cassidy/uL  RPR     Status: None   Collection Time: 11/07/17  1:00 AM  Result Value Ref Range   RPR Ser Ql Non Reactive Non Reactive  ABO/Rh     Status: None   Collection Time: 11/07/17  1:10 AM  Result Value Ref Range   ABO/RH(D)      O NEG Performed at [facility], [address] [place], [place] [address]    A: Polyhydramnios     Ehlers-Danlos syndrome     Macrosomia EFW 9 lbs -4082 gm P Pitocin dc'ed     IVF given     Discussed plan of care with Dr. Estanislado Pandyivard-      Pt counseled that it is not recommended that she have a C/S for EFW or Ehlers-Danlos syndrome. She has been cleared by MFM and Cardiology. Will talk to pt again after she is comfortable with epidural placement about  her desire for elective primary C/S and notify Dr Estanislado Pandyivard of plan of care  Illene BolusLori Clemmons CNM  Per report, at [address] [name], [place] placed foley bulb. Plan to increase Pitocin 1x1 up to a max of six per Dr. Estanislado Pandyivard.   Cassidy Riggersllis Cassidy Myers 7:39 PM 11/07/17

## 2017-11-07 NOTE — Progress Notes (Signed)
LABOR PROGRESS NOTE  Cassidy Myers is a 22 y.o. G1P0 at 5314w1d  admitted for polyhydramnios  Subjective: Attempted foley balloon placement with speculum. Unable to place at this time. Pt is rating contractions 10/10. Contracting too often for another cytotec dose . Pitocin will be started at a max 576mu/min for cervical ripening.  Objective: BP (!) 105/56   Pulse 69   Temp 98 F (36.7 C) (Oral)   Resp 16   Ht 5\' 4"  (1.626 m)   Wt 208 lb 11.2 oz (94.7 kg)   LMP 01/31/2017 Comment: currently pregnant     BMI 35.82 kg/m  or  Vitals:   11/07/17 0957 11/07/17 1048 11/07/17 1140 11/07/17 1249  BP: 106/62 114/65 (!) 116/55 (!) 105/56  Pulse: 76 100 87 69  Resp: 18 16 18 16   Temp:   98 F (36.7 C)   TempSrc:   Oral   Weight:      Height:        Dilation: 1 Effacement (%): Thick Cervical Position: Posterior Station: -3 Presentation: Vertex Exam by:: lori clemmons(foley bulb attempt)  Labs: Lab Results  Component Value Date   WBC 10.3 11/07/2017   HGB 8.5 (L) 11/07/2017   HCT 26.9 (L) 11/07/2017   MCV 81.5 11/07/2017   PLT 314 11/07/2017    Patient Active Problem List   Diagnosis Date Noted  . Polyhydramnios affecting pregnancy in third trimester 11/07/2017  . Abdominal pain 07/08/2017  . Abdominal pain in pregnancy, second trimester 07/06/2017  . Family history of congenital or genetic condition 07/01/2017  . [redacted] weeks gestation of pregnancy   . Depression 03/09/2013  . 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

## 2017-11-07 NOTE — Progress Notes (Signed)
Cassidy Myers is a 22 y.o. G1P0 at 8889w1d admitted for induction of labor due to polyhydramnios, AFI of 31.  Subjective: Patient had dose of benadryl and was asleep. Family is concerned that the induction of labor is taking "too long". Last night I discussed with patient and family that inductions can sometimes take up to several days and reiterated this to family. I reassured them that the induction is proceeding, that the early labor part of an induction tends to take the longest time, and that fetal heart rate is reassuring. Patient woke up and stated she was frustrated with how long she had been in the hospital. Patient states she would desire a cesarean section if she had not transitioned to active labor by the morning. Patient desired a cesarean section earlier in the afternoon, Dr. Estanislado Pandyivard agreed. Patient then wanted to think about her options and elected to continue induction at that time.   Objective: Vitals:   11/07/17 2231 11/07/17 2301 11/07/17 2318 11/07/17 2331  BP: (!) 96/58 (!) 104/52  (!) 112/57  Pulse: 77 90  83  Resp: 18 16  16   Temp:   99.4 F (37.4 C)   TempSrc:   Axillary   Weight:      Height:       FHT:  FHR: 140 bpm, variability: moderate,  accelerations:  Present,  decelerations:  Absent UC:   regular, every 2-4 minutes SVE:   Dilation: 1.5 Effacement (%): 50 Station: -3 Exam by:: [employer]NC FinancialLori Clemmons, CNM  Labs: Lab Results  Component Value Date   WBC 10.3 11/07/2017   HGB 8.5 (L) 11/07/2017   HCT 26.9 (L) 11/07/2017   MCV 81.5 11/07/2017   PLT 314 11/07/2017    Assessment / Plan: Induction of labor due to polyhydramnios,  progressing well on pitocin  AROM when foley bulb falls out if fetal station makes it safe to do so   Labor: Progressing on Pitocin, will continue to increase then AROM Preeclampsia:  no signs or symptoms of toxicity, intake and ouput balanced and labs stable Fetal Wellbeing:  Category I Pain Control:  Epidural I/D:  n/a Anticipated  MOD:  NSVD vs cesarean section   [name] 11/07/2017, 11:58 PM

## 2017-11-07 NOTE — Progress Notes (Signed)
Solon AugustaMyranda Kirn is a 22 y.o. G1P0 at 7385w1d admitted for induction of labor due to polyhydramnios, AFI of 31.  Subjective: Patient is now feeling comfortable with epidural, hot spot has numbed.   Objective: Vitals:   11/07/17 1856 11/07/17 1901 11/07/17 1930 11/07/17 2001  BP: 113/66 119/67 (!) 98/44 98/61  Pulse: 71 81 69 76  Resp: 18 18 16 18   Temp:      TempSrc:      Weight:      Height:       FHT:  FHR: 135 bpm, variability: moderate,  accelerations:  Present,  decelerations:  Absent UC:   regular, every 2-4 minutes SVE:   Dilation: 1.5 Effacement (%): 50 Station: -3 Exam by:: [employer] FinancialLori Clemmons, CNM  Labs: Lab Results  Component Value Date   WBC 10.3 11/07/2017   HGB 8.5 (L) 11/07/2017   HCT 26.9 (L) 11/07/2017   MCV 81.5 11/07/2017   PLT 314 11/07/2017    Assessment / Plan: Induction of labor due to polyhydramnios,  progressing well on pitocin  Patient with Ehler-Danlos syndrome, hypermobility type; will take care to avoid putting strain on patient's joints when pushing. Cleared for vaginal delivery per MFM.   Labor: Progressing on Pitocin, will continue to increase then AROM Preeclampsia:  no signs or symptoms of toxicity, intake and ouput balanced and labs stable Fetal Wellbeing:  Category I Pain Control:  Epidural I/D:  n/a Anticipated MOD:  NSVD  [name] 11/07/2017, 8:19 PM

## 2017-11-07 NOTE — Progress Notes (Signed)
Solon AugustaMyranda Grieshaber is a 22 y.o. G1P0 at 6453w1d admitted for polyhydramnios   Subjective: Patient is feeling the contractions and reports they are moderately painful. She has not taken a dose of IV pain medication yet.   Objective: Vitals:   11/07/17 0401 11/07/17 0513 11/07/17 0600 11/07/17 0710  BP: 128/70 (!) 105/47 109/76 122/70  Pulse: 66 73 88 88  Resp: 18 18 18 16   Temp:      TempSrc:      Weight:      Height:        FHT:  FHR: 130 bpm, variability: moderate,  accelerations:  Present,  decelerations:  Absent UC:   regular, every 1.5-132min minutes SVE:   Dilation: 1 Effacement (%): 40 Station: -3 Exam by:: Pola CornKia Wheatley, RN  Labs: Lab Results  Component Value Date   WBC 10.3 11/07/2017   HGB 8.5 (L) 11/07/2017   HCT 26.9 (L) 11/07/2017   MCV 81.5 11/07/2017   PLT 314 11/07/2017    Assessment / Plan: Induction of labor due to polyhydramnios,  progressing well on pitocin  Labor: Progressing normally Preeclampsia:  no signs or symptoms of toxicity, intake and ouput balanced and labs stable Fetal Wellbeing:  Category I Pain Control:  Labor support without medications, IV pain meds and Nitrous Oxide I/D:  n/a Anticipated MOD:  NSVD  [name] 11/07/2017, 7:36 AM

## 2017-11-07 NOTE — Anesthesia Pain Management Evaluation Note (Signed)
  [facility] Pain Management Visit Note  Patient: Cassidy Myers, 22 y.o., female  "Hello I am a member of the anesthesia team at [facility]. We have an anesthesia team available at all times to provide care throughout the hospital, including epidural management and anesthesia for C-section. I don't know your plan for the delivery whether it a natural birth, water birth, IV sedation, nitrous supplementation, doula or epidural, but we want to meet your pain goals."   1.Was your pain managed to your expectations on prior hospitalizations?   No prior hospitalizations  2.What is your expectation for pain management during this hospitalization?     IV pain meds  3.How can we help you reach that goal?   Record the patient's initial score and the patient's pain goal.   Pain: 8  Pain Goal: 5 [facility] wants you to be able to say your pain was always managed very well.  Laban EmperorMalinova,Nataliya Hristova 11/07/2017

## 2017-11-07 NOTE — Progress Notes (Signed)
LABOR PROGRESS NOTE  Cassidy Myers is a 22 y.o. G1P0 at 2775w1d  admitted for induction of labor due to polyhydramnios  Subjective: Very Knowledgeable regarding birth process. Consented to foley Bulb but unable to place at this time; Pt is uncomfortable with contractions . Encouraged pt to ambulate.   Objective: BP (!) 111/52   Pulse 73   Temp 98.1 F (36.7 C) (Oral)   Resp 16   Ht 5\' 4"  (1.626 m)   Wt 208 lb 11.2 oz (94.7 kg)   LMP 01/31/2017 Comment: currently pregnant     BMI 35.82 kg/m  or  Vitals:   11/07/17 0600 11/07/17 0710 11/07/17 0812 11/07/17 0919  BP: 109/76 122/70 126/71 (!) 111/52  Pulse: 88 88 93 73  Resp: 18 16 18 16   Temp:   98.1 F (36.7 C)   TempSrc:   Oral   Weight:      Height:         Dilation: 1 Effacement (%): 40 Cervical Position: Posterior Station: -3 Presentation: Vertex Exam by:: L.Clemmons, CNM  Labs: CBC Latest Ref Rng & Units 11/07/2017 09/21/2017 06/10/2017  WBC 4.0 - 10.5 K/uL 10.3 9.9 6.2  Hemoglobin 12.0 - 15.0 g/dL 1.6(X8.5(L) 0.9(U9.1(L) 11.4(L)  Hematocrit 36.0 - 46.0 % 26.9(L) 27.3(L) 32.1(L)  Platelets 150 - 400 K/uL 314 290 209    Patient Active Problem List   Diagnosis Date Noted  . Polyhydramnios affecting pregnancy in third trimester 11/07/2017  . Abdominal pain 07/08/2017  . Abdominal pain in pregnancy, second trimester 07/06/2017  . Family history of congenital or genetic condition 07/01/2017  . [redacted] weeks gestation of pregnancy   . Depression 03/09/2013  . 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 G1P0 at 4575w1d here for Induction for polyhydramnios Physiologic Anemia  Labor: cervical ripening; pos 1 dose cytotec     Attempted foley bulb placement and unable to place at this time; place when able     Hold cytotec dosing due to contractions       Fetal Wellbeing:  Category 1 Pain Control:  Desires to only use IV pain medications Anticipated MOD:  SVD  [name] A Clemmons CNM 11/07/2017, 9:21 AM

## 2017-11-07 NOTE — H&P (Signed)
Cassidy Myers is a 22 y.o. female presenting for induction of labor for polyhydramnios. OB History    Gravida  1   Para      Term      Preterm      AB      Living        SAB      TAB      Ectopic      Multiple      Live Births             Past Medical History:  Diagnosis Date  . ADHD   . Allergy   . Anxiety   . Depression   . Dysautonomia (HCC)   . Ehlers-Danlos syndrome   . ZOXWRUEA(540.9Headache(784.0)    Past Surgical History:  Procedure Laterality Date  . NO PAST SURGERIES    . WISDOM TOOTH EXTRACTION     Family History: family history includes Asthma in her mother; Cancer in her mother; Depression in her father, maternal grandfather, maternal grandmother, mother, paternal grandfather, and paternal grandmother; Diabetes in her paternal grandfather and paternal grandmother; Schizophrenia in her father. Social History:  reports that she has never smoked. She has never used smokeless tobacco. She reports that she does not drink alcohol or use drugs.     Maternal Diabetes: No Genetic Screening: Normal Maternal Ultrasounds/Referrals: Abnormal:  Findings:   Other:polyhydramnios, AFI 31 Fetal Ultrasounds or other Referrals:  None Maternal Substance Abuse:  No Significant Maternal Medications:  Meds include: Other: Flexiril and Percocet Significant Maternal Lab Results:  None Other Comments:  None  Review of Systems  All other systems reviewed and are negative.  Maternal Medical History:  Contractions: Frequency: rare.   Duration is approximately 60 seconds.   Perceived severity is mild.    Fetal activity: Perceived fetal activity is normal.   Last perceived fetal movement was within the past hour.    Prenatal complications: Polyhydramnios.   Prenatal Complications - Diabetes: none.    Dilation: Fingertip Effacement (%): 30 Station: -3 Exam by:: Kathalene FramesEllis Greer, CNM Blood pressure 117/78, pulse 95, temperature 98.4 F (36.9 C), temperature source Oral, resp.  rate 18, weight 94.7 kg (208 lb 11.2 oz), last menstrual period 01/31/2017.   Maternal Exam:  Uterine Assessment: Contraction strength is mild.  Contraction duration is 60 seconds. Contraction frequency is rare.   Abdomen: Patient reports no abdominal tenderness. Fundal height is Size> dates, polyhydramnios.   Estimated fetal weight is 9lbs.   Fetal presentation: vertex  Introitus: Normal vulva. Normal vagina.  Pelvis: adequate for delivery.   Cervix: Cervix evaluated by digital exam.     Fetal Exam Fetal Monitor Review: Mode: ultrasound.   Baseline rate: 135.  Variability: moderate (6-25 bpm).   Pattern: accelerations present and no decelerations.    Fetal State Assessment: Category I - tracings are normal.     Physical Exam  Vitals reviewed. Constitutional: She is oriented to person, place, and time. She appears well-developed and well-nourished.  HENT:  Head: Normocephalic.  Eyes: Pupils are equal, round, and reactive to light.  Cardiovascular: Normal rate, regular rhythm and normal heart sounds.  Respiratory: Effort normal and breath sounds normal.  Genitourinary: Vagina normal and uterus normal.  Musculoskeletal: Normal range of motion.  Neurological: She is alert and oriented to person, place, and time.  Skin: Skin is warm and dry.  Psychiatric: She has a normal mood and affect. Her behavior is normal. Judgment and thought content normal.    Prenatal labs:  ABO, Rh: O/Negative/-- (11/02 0000) Antibody: Negative (11/02 0000) Rubella: Immune (11/02 0000) RPR: Nonreactive (11/02 0000)  HBsAg: Negative (11/02 0000)  HIV: Non-reactive (11/02 0000)  GBS: Negative (05/06 0000)   Assessment/Plan: 22 y.o. G1P0 at 39 weeks Polyhydramnios with AFI 31 Cat 1 FHTs Cytotec induction of labor  Admit to L&D Anticipate NSVD   As EFW is 9lbs, increased risk for shoulder dystocia discussed with patient. She desires to continue with vaginal delivery.   Janeece Riggers 11/07/2017, 1:28 AM

## 2017-11-07 NOTE — Anesthesia Preprocedure Evaluation (Signed)
Anesthesia Evaluation  Patient identified by MRN, date of birth, ID band Patient awake    Reviewed: Allergy & Precautions, H&P , NPO status , Patient's Chart, lab work & pertinent test results  History of Anesthesia Complications Negative for: history of anesthetic complications  Airway Mallampati: II  TM Distance: >3 FB Neck ROM: full    Dental no notable dental hx. (+) Teeth Intact   Pulmonary neg pulmonary ROS,    Pulmonary exam normal breath sounds clear to auscultation       Cardiovascular Normal cardiovascular exam Rhythm:regular Rate:Normal  Dysautonomia   Neuro/Psych negative neurological ROS  negative psych ROS   GI/Hepatic negative GI ROS, Neg liver ROS,   Endo/Other  negative endocrine ROS  Renal/GU negative Renal ROS  negative genitourinary   Musculoskeletal Ehlers Danlos Syndrome   Abdominal   Peds  Hematology negative hematology ROS (+)   Anesthesia Other Findings   Reproductive/Obstetrics (+) Pregnancy                             Anesthesia Physical Anesthesia Plan  ASA: II  Anesthesia Plan: Epidural   Post-op Pain Management:    Induction:   PONV Risk Score and Plan:   Airway Management Planned:   Additional Equipment:   Intra-op Plan:   Post-operative Plan:   Informed Consent: I have reviewed the patients History and Physical, chart, labs and discussed the procedure including the risks, benefits and alternatives for the proposed anesthesia with the patient or authorized representative who has indicated his/her understanding and acceptance.     Plan Discussed with:   Anesthesia Plan Comments:         Anesthesia Quick Evaluation

## 2017-11-08 ENCOUNTER — Encounter (HOSPITAL_COMMUNITY)
Admission: RE | Disposition: A | Discharge status: Home / Self Care | Payer: Self-pay | Source: Home / Self Care | Attending: Obstetrics and Gynecology

## 2017-11-08 ENCOUNTER — Encounter (HOSPITAL_COMMUNITY): Payer: Self-pay

## 2017-11-08 LAB — CBC
HCT: 22.9 % — ABNORMAL LOW (ref 36.0–46.0)
Hemoglobin: 7.3 g/dL — ABNORMAL LOW (ref 12.0–15.0)
MCH: 25.9 pg — ABNORMAL LOW (ref 26.0–34.0)
MCHC: 31.9 g/dL (ref 30.0–36.0)
MCV: 81.2 fL (ref 78.0–100.0)
Platelets: 229 10*3/uL (ref 150–400)
RBC: 2.82 MIL/uL — ABNORMAL LOW (ref 3.87–5.11)
RDW: 13.9 % (ref 11.5–15.5)
WBC: 15.5 10*3/uL — ABNORMAL HIGH (ref 4.0–10.5)

## 2017-11-08 LAB — PREPARE RBC (CROSSMATCH)

## 2017-11-08 SURGERY — Surgical Case
Anesthesia: Epidural

## 2017-11-08 MED ORDER — KETOROLAC TROMETHAMINE 30 MG/ML IJ SOLN
INTRAMUSCULAR | Status: AC
  Filled 2017-11-08: qty 1

## 2017-11-08 MED ORDER — SODIUM CHLORIDE 0.9 % IR SOLN
Status: DC | PRN
  Administered 2017-11-08: 1
  Administered 2017-11-08: 1000 mL

## 2017-11-08 MED ORDER — OXYCODONE-ACETAMINOPHEN 5-325 MG PO TABS
1.0000 | ORAL_TABLET | ORAL | Status: DC | PRN
  Administered 2017-11-09 – 2017-11-11 (×4): 1 via ORAL
  Filled 2017-11-08 (×4): qty 1

## 2017-11-08 MED ORDER — OXYTOCIN 10 UNIT/ML IJ SOLN
INTRAVENOUS | Status: DC | PRN
  Administered 2017-11-08: 40 [IU] via INTRAVENOUS

## 2017-11-08 MED ORDER — FAMOTIDINE 20 MG PO TABS
20.0000 mg | ORAL_TABLET | Freq: Two times a day (BID) | ORAL | Status: DC
  Administered 2017-11-08 – 2017-11-11 (×6): 20 mg via ORAL
  Filled 2017-11-08 (×6): qty 1

## 2017-11-08 MED ORDER — ACETAMINOPHEN 325 MG PO TABS
ORAL_TABLET | ORAL | Status: AC
  Filled 2017-11-08: qty 2

## 2017-11-08 MED ORDER — ONDANSETRON HCL 4 MG/2ML IJ SOLN
4.0000 mg | Freq: Three times a day (TID) | INTRAMUSCULAR | Status: DC | PRN
  Administered 2017-11-10: 4 mg via INTRAVENOUS
  Filled 2017-11-08: qty 2

## 2017-11-08 MED ORDER — LACTATED RINGERS IV SOLN
INTRAVENOUS | Status: DC
  Administered 2017-11-09: 04:00:00 via INTRAVENOUS

## 2017-11-08 MED ORDER — DEXAMETHASONE SODIUM PHOSPHATE 4 MG/ML IJ SOLN
INTRAMUSCULAR | Status: AC
  Filled 2017-11-08: qty 1

## 2017-11-08 MED ORDER — METOCLOPRAMIDE HCL 5 MG/ML IJ SOLN
INTRAMUSCULAR | Status: DC | PRN
  Administered 2017-11-08: 10 mg via INTRAVENOUS

## 2017-11-08 MED ORDER — MEPERIDINE HCL 25 MG/ML IJ SOLN
INTRAMUSCULAR | Status: AC
  Filled 2017-11-08: qty 1

## 2017-11-08 MED ORDER — KETOROLAC TROMETHAMINE 30 MG/ML IJ SOLN
30.0000 mg | Freq: Four times a day (QID) | INTRAMUSCULAR | Status: AC | PRN
  Administered 2017-11-08: 30 mg via INTRAVENOUS
  Filled 2017-11-08: qty 1

## 2017-11-08 MED ORDER — SODIUM CHLORIDE 0.9 % IV SOLN
3.0000 g | Freq: Once | INTRAVENOUS | Status: AC
  Administered 2017-11-08: 3 g via INTRAVENOUS
  Filled 2017-11-08: qty 3

## 2017-11-08 MED ORDER — NALBUPHINE HCL 10 MG/ML IJ SOLN
2.5000 mg | Freq: Four times a day (QID) | INTRAMUSCULAR | Status: DC | PRN
  Administered 2017-11-08: 2.5 mg via INTRAVENOUS
  Filled 2017-11-08: qty 1

## 2017-11-08 MED ORDER — CEFAZOLIN SODIUM-DEXTROSE 2-3 GM-%(50ML) IV SOLR
INTRAVENOUS | Status: DC | PRN
  Administered 2017-11-08: 2 g via INTRAVENOUS

## 2017-11-08 MED ORDER — OXYTOCIN 10 UNIT/ML IJ SOLN
INTRAMUSCULAR | Status: AC
  Filled 2017-11-08: qty 4

## 2017-11-08 MED ORDER — COCONUT OIL OIL
1.0000 | TOPICAL_OIL | Status: DC | PRN
Start: 2017-11-08 — End: 2017-11-11
  Administered 2017-11-09: 1 via TOPICAL
  Filled 2017-11-08: qty 120

## 2017-11-08 MED ORDER — DIBUCAINE 1 % RE OINT: 1.0000 "application " | TOPICAL_OINTMENT | RECTAL | Status: DC | PRN

## 2017-11-08 MED ORDER — NALBUPHINE HCL 10 MG/ML IJ SOLN: 5.0000 mg | Freq: Once | INTRAMUSCULAR | Status: DC | PRN

## 2017-11-08 MED ORDER — DIPHENHYDRAMINE HCL 50 MG/ML IJ SOLN
INTRAMUSCULAR | Status: DC | PRN
  Administered 2017-11-08: 12.5 mg via INTRAVENOUS

## 2017-11-08 MED ORDER — SODIUM CHLORIDE 0.9 % IV SOLN
3.0000 g | Freq: Four times a day (QID) | INTRAVENOUS | Status: DC
  Administered 2017-11-09 (×4): 3 g via INTRAVENOUS
  Filled 2017-11-08 (×5): qty 3

## 2017-11-08 MED ORDER — WITCH HAZEL-GLYCERIN EX PADS: 1.0000 "application " | MEDICATED_PAD | CUTANEOUS | Status: DC | PRN

## 2017-11-08 MED ORDER — DIPHENHYDRAMINE HCL 50 MG/ML IJ SOLN: 12.5000 mg | INTRAMUSCULAR | Status: DC | PRN

## 2017-11-08 MED ORDER — NALBUPHINE HCL 10 MG/ML IJ SOLN: 5.0000 mg | INTRAMUSCULAR | Status: DC | PRN

## 2017-11-08 MED ORDER — KETOROLAC TROMETHAMINE 30 MG/ML IJ SOLN
30.0000 mg | Freq: Four times a day (QID) | INTRAMUSCULAR | Status: AC | PRN
  Administered 2017-11-08: 30 mg via INTRAMUSCULAR

## 2017-11-08 MED ORDER — PRENATAL MULTIVITAMIN CH
1.0000 | ORAL_TABLET | Freq: Every day | ORAL | Status: DC
  Administered 2017-11-10 – 2017-11-11 (×2): 1 via ORAL
  Filled 2017-11-08 (×2): qty 1

## 2017-11-08 MED ORDER — LIDOCAINE-EPINEPHRINE (PF) 2 %-1:200000 IJ SOLN
INTRAMUSCULAR | Status: AC
  Filled 2017-11-08: qty 20

## 2017-11-08 MED ORDER — SODIUM CHLORIDE 0.9% FLUSH
3.0000 mL | INTRAVENOUS | Status: DC | PRN
Start: 2017-11-08 — End: 2017-11-11

## 2017-11-08 MED ORDER — NALOXONE HCL 4 MG/10ML IJ SOLN
1.0000 ug/kg/h | INTRAVENOUS | Status: DC | PRN
  Filled 2017-11-08: qty 5

## 2017-11-08 MED ORDER — FENTANYL CITRATE (PF) 100 MCG/2ML IJ SOLN: 25.0000 ug | INTRAMUSCULAR | Status: DC | PRN

## 2017-11-08 MED ORDER — MEPERIDINE HCL 25 MG/ML IJ SOLN: 6.2500 mg | INTRAMUSCULAR | Status: DC | PRN

## 2017-11-08 MED ORDER — PHENYLEPHRINE HCL 10 MG/ML IJ SOLN
INTRAMUSCULAR | Status: DC | PRN
  Administered 2017-11-08: 80 ug via INTRAVENOUS
  Administered 2017-11-08: 40 ug via INTRAVENOUS

## 2017-11-08 MED ORDER — SIMETHICONE 80 MG PO CHEW: 80.0000 mg | CHEWABLE_TABLET | ORAL | Status: DC | PRN

## 2017-11-08 MED ORDER — SENNOSIDES-DOCUSATE SODIUM 8.6-50 MG PO TABS
2.0000 | ORAL_TABLET | ORAL | Status: DC
  Administered 2017-11-08 – 2017-11-09 (×2): 2 via ORAL
  Filled 2017-11-08 (×2): qty 2

## 2017-11-08 MED ORDER — OXYTOCIN 40 UNITS IN LACTATED RINGERS INFUSION - SIMPLE MED: 2.5000 [IU]/h | INTRAVENOUS | Status: AC

## 2017-11-08 MED ORDER — DIPHENHYDRAMINE HCL 25 MG PO CAPS
25.0000 mg | ORAL_CAPSULE | ORAL | Status: DC | PRN
Start: 2017-11-08 — End: 2017-11-11
  Administered 2017-11-08 – 2017-11-09 (×3): 25 mg via ORAL
  Filled 2017-11-08 (×4): qty 1

## 2017-11-08 MED ORDER — ACETAMINOPHEN 325 MG PO TABS
650.0000 mg | ORAL_TABLET | ORAL | Status: DC | PRN
  Administered 2017-11-08 – 2017-11-09 (×2): 650 mg via ORAL
  Filled 2017-11-08 (×2): qty 2

## 2017-11-08 MED ORDER — SIMETHICONE 80 MG PO CHEW
80.0000 mg | CHEWABLE_TABLET | ORAL | Status: DC
  Administered 2017-11-08 – 2017-11-10 (×3): 80 mg via ORAL
  Filled 2017-11-08 (×3): qty 1

## 2017-11-08 MED ORDER — PHENYLEPHRINE 40 MCG/ML (10ML) SYRINGE FOR IV PUSH (FOR BLOOD PRESSURE SUPPORT)
PREFILLED_SYRINGE | INTRAVENOUS | Status: AC
  Filled 2017-11-08: qty 10

## 2017-11-08 MED ORDER — TETANUS-DIPHTH-ACELL PERTUSSIS 5-2.5-18.5 LF-MCG/0.5 IM SUSP: 0.5000 mL | Freq: Once | INTRAMUSCULAR | Status: DC

## 2017-11-08 MED ORDER — NALOXONE HCL 0.4 MG/ML IJ SOLN: 0.4000 mg | INTRAMUSCULAR | Status: DC | PRN

## 2017-11-08 MED ORDER — DIPHENHYDRAMINE HCL 50 MG/ML IJ SOLN
INTRAMUSCULAR | Status: AC
  Filled 2017-11-08: qty 1

## 2017-11-08 MED ORDER — SCOPOLAMINE 1 MG/3DAYS TD PT72
MEDICATED_PATCH | TRANSDERMAL | Status: DC | PRN
  Administered 2017-11-08: 1 via TRANSDERMAL

## 2017-11-08 MED ORDER — OXYCODONE-ACETAMINOPHEN 5-325 MG PO TABS
2.0000 | ORAL_TABLET | ORAL | Status: DC | PRN
  Administered 2017-11-09 – 2017-11-10 (×2): 2 via ORAL
  Filled 2017-11-08 (×2): qty 2

## 2017-11-08 MED ORDER — DIPHENHYDRAMINE HCL 25 MG PO CAPS: 25.0000 mg | ORAL_CAPSULE | Freq: Four times a day (QID) | ORAL | Status: DC | PRN

## 2017-11-08 MED ORDER — SODIUM CHLORIDE 0.9 % IV SOLN: 10.0000 mL/h | Freq: Once | INTRAVENOUS | Status: DC

## 2017-11-08 MED ORDER — DEXAMETHASONE SODIUM PHOSPHATE 4 MG/ML IJ SOLN
INTRAMUSCULAR | Status: DC | PRN
  Administered 2017-11-08: 4 mg via INTRAVENOUS

## 2017-11-08 MED ORDER — SIMETHICONE 80 MG PO CHEW
80.0000 mg | CHEWABLE_TABLET | Freq: Three times a day (TID) | ORAL | Status: DC
  Administered 2017-11-09 – 2017-11-11 (×5): 80 mg via ORAL
  Filled 2017-11-08 (×5): qty 1

## 2017-11-08 MED ORDER — MORPHINE SULFATE (PF) 0.5 MG/ML IJ SOLN
INTRAMUSCULAR | Status: DC | PRN
  Administered 2017-11-08: 3 mg via EPIDURAL

## 2017-11-08 MED ORDER — IBUPROFEN 600 MG PO TABS
600.0000 mg | ORAL_TABLET | Freq: Four times a day (QID) | ORAL | Status: DC
  Administered 2017-11-09 – 2017-11-11 (×9): 600 mg via ORAL
  Filled 2017-11-08 (×11): qty 1

## 2017-11-08 MED ORDER — LACTATED RINGERS IV SOLN
INTRAVENOUS | Status: DC | PRN
  Administered 2017-11-08: 14:00:00 via INTRAVENOUS

## 2017-11-08 MED ORDER — MEPERIDINE HCL 25 MG/ML IJ SOLN
INTRAMUSCULAR | Status: DC | PRN
  Administered 2017-11-08 (×2): 12.5 mg via INTRAVENOUS

## 2017-11-08 MED ORDER — MORPHINE SULFATE (PF) 0.5 MG/ML IJ SOLN
INTRAMUSCULAR | Status: AC
  Filled 2017-11-08: qty 10

## 2017-11-08 MED ORDER — MENTHOL 3 MG MT LOZG: 1.0000 | LOZENGE | OROMUCOSAL | Status: DC | PRN

## 2017-11-08 MED ORDER — ACETAMINOPHEN 325 MG PO TABS
650.0000 mg | ORAL_TABLET | Freq: Four times a day (QID) | ORAL | Status: DC | PRN
  Administered 2017-11-08: 650 mg via ORAL

## 2017-11-08 MED ORDER — METOCLOPRAMIDE HCL 5 MG/ML IJ SOLN
INTRAMUSCULAR | Status: AC
  Filled 2017-11-08: qty 2

## 2017-11-08 MED ORDER — ZOLPIDEM TARTRATE 5 MG PO TABS: 5.0000 mg | ORAL_TABLET | Freq: Every evening | ORAL | Status: DC | PRN

## 2017-11-08 MED ORDER — LIDOCAINE-EPINEPHRINE 2 %-1:100000 IJ SOLN
INTRAMUSCULAR | Status: DC | PRN
  Administered 2017-11-08: 2 mL via INTRADERMAL
  Administered 2017-11-08: 5 mL via INTRADERMAL
  Administered 2017-11-08: 3 mL via INTRADERMAL
  Administered 2017-11-08: 5 mL via INTRADERMAL

## 2017-11-08 MED ORDER — SCOPOLAMINE 1 MG/3DAYS TD PT72
MEDICATED_PATCH | TRANSDERMAL | Status: AC
  Filled 2017-11-08: qty 1

## 2017-11-08 MED ORDER — ONDANSETRON HCL 4 MG/2ML IJ SOLN
INTRAMUSCULAR | Status: AC
  Filled 2017-11-08: qty 2

## 2017-11-08 SURGICAL SUPPLY — 37 items
BENZOIN TINCTURE PRP APPL 2/3 (GAUZE/BANDAGES/DRESSINGS) ×2 IMPLANT
CHLORAPREP W/TINT 26ML (MISCELLANEOUS) ×2 IMPLANT
CLAMP CORD UMBIL (MISCELLANEOUS) IMPLANT
CLOTH BEACON ORANGE TIMEOUT ST (SAFETY) ×2 IMPLANT
CLSR STERI-STRIP ANTIMIC 1/2X4 (GAUZE/BANDAGES/DRESSINGS) ×2 IMPLANT
DRAIN JACKSON PRT FLT 10 (DRAIN) IMPLANT
DRSG OPSITE POSTOP 4X10 (GAUZE/BANDAGES/DRESSINGS) ×2 IMPLANT
ELECT REM PT RETURN 9FT ADLT (ELECTROSURGICAL) ×2
ELECTRODE REM PT RTRN 9FT ADLT (ELECTROSURGICAL) ×1 IMPLANT
EVACUATOR SILICONE 100CC (DRAIN) IMPLANT
EXTRACTOR VACUUM KIWI (MISCELLANEOUS) ×2 IMPLANT
EXTRACTOR VACUUM M CUP 4 TUBE (SUCTIONS) IMPLANT
GLOVE BIO SURGEON STRL SZ 6.5 (GLOVE) ×2 IMPLANT
GLOVE BIOGEL PI IND STRL 7.0 (GLOVE) ×2 IMPLANT
GLOVE BIOGEL PI INDICATOR 7.0 (GLOVE) ×2
GOWN STRL REUS W/TWL LRG LVL3 (GOWN DISPOSABLE) ×4 IMPLANT
HEMOSTAT SURGICEL 2X14 (HEMOSTASIS) ×2 IMPLANT
KIT ABG SYR 3ML LUER SLIP (SYRINGE) ×4 IMPLANT
NEEDLE HYPO 25X5/8 SAFETYGLIDE (NEEDLE) ×4 IMPLANT
NS IRRIG 1000ML POUR BTL (IV SOLUTION) ×2 IMPLANT
PACK C SECTION WH (CUSTOM PROCEDURE TRAY) ×2 IMPLANT
PAD OB MATERNITY 4.3X12.25 (PERSONAL CARE ITEMS) ×2 IMPLANT
PENCIL SMOKE EVAC W/HOLSTER (ELECTROSURGICAL) ×2 IMPLANT
RETRACTOR WND ALEXIS 25 LRG (MISCELLANEOUS) ×1 IMPLANT
RTRCTR C-SECT PINK 25CM LRG (MISCELLANEOUS) IMPLANT
RTRCTR WOUND ALEXIS 25CM LRG (MISCELLANEOUS) ×2
STRIP CLOSURE SKIN 1/2X4 (GAUZE/BANDAGES/DRESSINGS) ×2 IMPLANT
SUT CHROMIC 0 CT 1 (SUTURE) ×2 IMPLANT
SUT MNCRL AB 3-0 PS2 27 (SUTURE) ×2 IMPLANT
SUT PLAIN 2 0 (SUTURE) ×2
SUT PLAIN 2 0 XLH (SUTURE) ×2 IMPLANT
SUT PLAIN ABS 2-0 CT1 27XMFL (SUTURE) ×2 IMPLANT
SUT SILK 2 0 SH (SUTURE) IMPLANT
SUT VIC AB 0 CTX 36 (SUTURE) ×4
SUT VIC AB 0 CTX36XBRD ANBCTRL (SUTURE) ×4 IMPLANT
TOWEL OR 17X24 6PK STRL BLUE (TOWEL DISPOSABLE) ×2 IMPLANT
TRAY FOLEY W/BAG SLVR 14FR LF (SET/KITS/TRAYS/PACK) ×2 IMPLANT

## 2017-11-08 NOTE — Progress Notes (Signed)
Patient ID: Cassidy Myers, female   DOB: 05/17/1996, 22 y.o.   MRN: [id number]  Pt without c/o BP 109/70   Pulse 72   Temp 99.5 F (37.5 C) (Axillary)   Resp 18   Ht 5\' 4"  (1.626 m)   Wt 94.7 kg (208 lb 11.2 oz)   LMP 01/31/2017 Comment: currently pregnant     BMI 35.82 kg/m  Cat 1 toco q 2min cx unchanged FTD Pt desires to proceed with CS R&B reviewed Consent signed

## 2017-11-08 NOTE — Progress Notes (Signed)
Cassidy Myers is a 22 y.o. G1P0 at 6255w1d admitted for induction of labor due to polyhydramnios, AFI of 31.  Subjective: Patient reported she was able to sleep and that the itching from epidural reduced after Nubain dose. She requested to see the MD on call this morning to discuss her options of continuing IOL vs elective cesarean section.   Objective: Vitals:   11/08/17 0530 11/08/17 0601 11/08/17 0631 11/08/17 0646  BP: (!) 105/51 (!) 115/55 107/86   Pulse: 71 78 73   Resp: 18 16 18    Temp:    98.5 F (36.9 C)  TempSrc:    Oral  Weight:      Height:       FHT:  FHR: 145 bpm, variability: moderate,  accelerations:  Present,  decelerations:  Absent UC:   irregular, every 1.3-4 minutes SVE:  At 0242 she had bulging bag of water and was 7/100/ballottable. At 0630, fetal head felt higher and without bulging bag of water so exam more consistent with 5cm dilated but easily stretches to 7cm. Cervix very stretchy. Unchanged.  Labs: Lab Results  Component Value Date   WBC 10.3 11/07/2017   HGB 8.5 (L) 11/07/2017   HCT 26.9 (L) 11/07/2017   MCV 81.5 11/07/2017   PLT 314 11/07/2017    Assessment / Plan: Induction of labor due to polyhydramnios,  progressing well on pitocin  AROM when fetal head descends further into pelvis  Labor: Progressing on Pitocin, will continue to increase then AROM Preeclampsia:  no signs or symptoms of toxicity, intake and ouput balanced and labs stable Fetal Wellbeing:  Category I Pain Control:  Epidural I/D:  n/a Anticipated MOD:  NSVD vs cesarean section   Cassidy Myers 11/08/2017, 6:51 AM

## 2017-11-08 NOTE — Progress Notes (Signed)
Cassidy Myers is a 22 y.o. G1P0 at 4162w1d admitted for induction of labor due to polyhydramnios, AFI of 31.  Subjective: Called to bedside because baby's heartbeat began tracing high on fundus, the nurses were concerned the baby was no longer vertex. Vertex confirmed with bedside ultrasound. Patient is tired from the induction process and feeling overwhelmed. When Foley bulb was removed, patient had made considerable progress and this improved her outlook on the induction. Fetal head still high and ballottable, with the diagnosis of polyhydramnios and the fetal station remaining high, AROM cannot be safely considered at this time.   Objective: Vitals:   11/08/17 0031 11/08/17 0034 11/08/17 0101 11/08/17 0131  BP: (!) 100/51  (!) 99/54 (!) 103/51  Pulse: 95  75 86  Resp: 16  16 18   Temp:  98.6 F (37 C)    TempSrc:  Oral    Weight:      Height:       FHT:  FHR: 140 bpm, variability: moderate,  accelerations:  Present,  decelerations:  Absent UC:   regular, every 2 minutes SVE:   Dilation: 7 Effacement (%): 100 Station: Ballotable Exam by:: ellis greer, cnm  Labs: Lab Results  Component Value Date   WBC 10.3 11/07/2017   HGB 8.5 (L) 11/07/2017   HCT 26.9 (L) 11/07/2017   MCV 81.5 11/07/2017   PLT 314 11/07/2017    Assessment / Plan: Induction of labor due to polyhydramnios,  progressing well on pitocin  AROM when fetal head descends further into pelvis  Labor: Progressing on Pitocin, will continue to increase then AROM Preeclampsia:  no signs or symptoms of toxicity, intake and ouput balanced and labs stable Fetal Wellbeing:  Category I Pain Control:  Epidural I/D:  n/a Anticipated MOD:  NSVD vs cesarean section   [name] 11/08/2017, 2:53 AM

## 2017-11-08 NOTE — Progress Notes (Signed)
Patient ID: Cassidy Myers, female   DOB: 09/17/1995, 22 y.o.   MRN: [id number]  Pt is feeling some pressure BP 119/87   Pulse 94   Temp 98.1 F (36.7 C)   Resp 18   Ht 5\' 4"  (1.626 m)   Wt 94.7 kg (208 lb 11.2 oz)   LMP 01/31/2017 Comment: currently pregnant     BMI 35.82 kg/m  Cat 1 toco q 2minutes 7/100/0 Pt would like to wait another two hours because she had some mild change Contractions are adequate Will monitor closely and anticipate SVD

## 2017-11-08 NOTE — Anesthesia Postprocedure Evaluation (Signed)
Anesthesia Post Note  Patient: Cassidy Myers  Procedure(s) Performed: CESAREAN SECTION (N/A )     Patient location during evaluation: PACU Anesthesia Type: Epidural Level of consciousness: awake and alert and oriented Pain management: satisfactory to patient Vital Signs Assessment: post-procedure vital signs reviewed and stable Respiratory status: spontaneous breathing, nonlabored ventilation and respiratory function stable Cardiovascular status: stable and blood pressure returned to baseline Postop Assessment: no headache, no backache, epidural receding, patient able to bend at knees and no apparent nausea or vomiting Anesthetic complications: no    Last Vitals:  Vitals:   11/08/17 1615 11/08/17 1630  BP: 111/72 104/60  Pulse: 87 79  Resp: 18 14  Temp:  (!) 38.8 C  SpO2: 94% 95%    Last Pain:  Vitals:   11/08/17 1630  TempSrc: Axillary  PainSc:    Pain Goal:                 FOSTER,MICHAEL A.

## 2017-11-08 NOTE — Op Note (Signed)
Cesarean Section Procedure Note   Cassidy Myers  11/08/2017  Indications: Dystocia and failure to dilate   Pre-operative Diagnosis: PRIMARY CESAREAN SECTION.   Post-operative Diagnosis: Same   Surgeon: Surgeon(s) and Role:    * Jaymes Graffillard, Naima, MD - Primary   Assistants: Dale Durhamjade Montana CNM   Anesthesia: epidural   Procedure Details:  The patient was seen in the Holding Room. The risks, benefits, complications, treatment options, and expected outcomes were discussed with the patient. The patient concurred with the proposed plan, giving informed consent. identified as Cassidy Myers and the procedure verified as C-Section Delivery. A Time Out was held and the above information confirmed.  After induction of anesthesia, the patient was draped and prepped in the usual sterile manner. A transverse incision was made and carried down through the subcutaneous tissue to the fascia. Fascial incision was made in the midline and extended transversely. The fascia was separated from the underlying rectus muscle superiorly and inferiorly. The peritoneum was identified and entered. Peritoneal incision was extended longitudinally with good visualization of bowel and bladder. The utero-vesical peritoneal reflection was incised transversely and the bladder flap was bluntly freed from the lower uterine segment.  An alexsis retractor was placed in the abdomen.   A low transverse uterine incision was made. Delivered from cephalic presentation was a  infant, with Apgar scores of 8 at one minute and 8 at five minutes. The kiwi vacuum was used with two pulls in the green zone.  One pop off Cord ph was sent the umbilical cord was clamped and cut cord blood was obtained for evaluation. The placenta was removed Intact and appeared normal. The uterine outline, tubes and ovaries appeared normal}. The uterine incision was closed with running locked sutures of 0Vicryl. A second layer 0 vicrlyl was used to imbricate the uterine  incision    Hemostasis was observed. Lavage was carried out until clear. The alexsis was removed.  The peritoneum was closed with 0 chromic.  The muscles were examined and any bleeders were made hemostatic using bovie cautery device.   The fascia was then reapproximated with running sutures of 0 vicryl.  The subcutaneous tissue was reapproximated  With interrupted stitches using 2-0 plain gut. The subcuticular closure was performed using 3-360monocryl     Instrument, sponge, and needle counts were correct prior the abdominal closure and were correct at the conclusion of the case.    Findings: infant was delivered from vtx presentation. The fluid was clear.  The uterus tubes and ovaries appeared normal.     Estimated Blood Loss: 813cc   Total IV Fluids: 1650ml   Urine Output: 500CC OF clear urine  Specimens: placenta to path  Complications: no complications  Disposition: PACU - hemodynamically stable.   Maternal Condition: stable   Baby condition / location:  Couplet care / Skin to Skin  Attending Attestation: I performed the procedure.   Signed: Surgeon(s): Jaymes Graffillard, Naima, MD

## 2017-11-08 NOTE — Lactation Note (Signed)
This note was copied from a baby's chart. Lactation Consultation Note  Patient Name: Cassidy Myers Today's Date: 11/08/2017 Reason for consult: Follow-up assessment;1st time breastfeeding;Primapara;Term  P1 mother whose infant is now 356 hours old.  Mother holding baby as I arrived.  She has had visitors and is sleepy.  Baby was quiet with no observed feeding cues.    Encouraged mother to feed 8-12 times/24 hours or more if she shows cues. Reviewed cues.  Discussed the importance of STS.  Mother has soft breasts with short shafted nipples bilaterally.  Manual hand pump and breast shells brought to room and instructions given for use.  Mother will begin using shells tomorrow a.m.  Mom encouraged to feed baby 8-12 times/24 hours and with feeding cues. Answered questions and encouraged lots of STS while awake.  Mom encouraged to feed baby 8-12 times/24 hours and with feeding cues. Family present and supportive.  Mother will call for latch assistance as needed.   Maternal Data Formula Feeding for Exclusion: No Has patient been taught Hand Expression?: Yes Does the patient have breastfeeding experience prior to this delivery?: No  Feeding Feeding Type: Breast Fed Length of feed: 15 min  LATCH Score Latch: Repeated attempts needed to sustain latch, nipple held in mouth throughout feeding, stimulation needed to elicit sucking reflex.  Audible Swallowing: None  Type of Nipple: Everted at rest and after stimulation(short shafted bilaterally)  Comfort (Breast/Nipple): Soft / non-tender  Hold (Positioning): Assistance needed to correctly position infant at breast and maintain latch.  LATCH Score: 6  Interventions Interventions: Breast feeding basics reviewed;Assisted with latch;Skin to skin;Breast massage;Hand express;Position options;Support pillows;Adjust position;Breast compression;Shells;Hand pump  Lactation Tools Discussed/Used Tools: Shells Pump Review: Setup, frequency,  and cleaning;Milk Storage Initiated by:: help evert nipples Date initiated:: 11/08/17   Consult Status Consult Status: Follow-up Date: 11/09/17 Follow-up type: In-patient    Beth R DelFava 11/08/2017, 11:10 PM

## 2017-11-08 NOTE — Transfer of Care (Signed)
Immediate Anesthesia Transfer of Care Note  Patient: Cassidy Myers  Procedure(s) Performed: CESAREAN SECTION (N/A )  Patient Location: PACU  Anesthesia Type:Epidural  Level of Consciousness: awake, alert  and oriented  Airway & Oxygen Therapy: Patient Spontanous Breathing  Post-op Assessment: Report given to RN and Post -op Vital signs reviewed and stable  Post vital signs: Reviewed and stable  Last Vitals:  Vitals Value Taken Time  BP    Temp    Pulse 102 11/08/2017  3:43 PM  Resp    SpO2 99 % 11/08/2017  3:43 PM  Vitals shown include unvalidated device data.  Last Pain:  Vitals:   11/08/17 1424  TempSrc:   PainSc: 0-No pain         Complications: No apparent anesthesia complications

## 2017-11-08 NOTE — Progress Notes (Signed)
Pharmacy Antibiotic Note  Cassidy KaysMyranda Myers is a 22 y.o. female admitted on 11/07/2017 for IOL due to polyhydramnios.  Pharmacy has been consulted for Unasyn dosing for maternal temp s/p C-section.  Plan: Unasyn 3 gram IV q6h. No further dosage adjustments or labs necessary. No duration stated.  Height: 5\' 4"  (162.6 cm) Weight: 208 lb 11.2 oz (94.7 kg) IBW/kg (Calculated) : 54.7  Temp (24hrs), Avg:99.2 F (37.3 C), Min:98 F (36.7 C), Max:101.9 F (38.8 C)  Recent Labs  Lab 11/07/17 0100  WBC 10.3    CrCl cannot be calculated (Patient's most recent lab result is older than the maximum 21 days allowed.).    Allergies  Allergen Reactions  . Latex Itching and Other (See Comments)    Redness.  . Sulfa Antibiotics Nausea And Vomiting  . Shellfish Allergy Nausea Only and Rash    Antimicrobials this admission: Cefazolin 2 gram IV x 1 pre-op  Thank you for allowing pharmacy to be a part of this patient's care.  Claybon Jabsngel, Andrea G 11/08/2017 5:17 PM

## 2017-11-08 NOTE — Addendum Note (Signed)
Addendum  created 11/08/17 1734 by Graciela HusbandsFussell, Wynn O, CRNA   Order list changed

## 2017-11-08 NOTE — Lactation Note (Signed)
This note was copied from a baby's chart. Lactation Consultation Note  Patient Name: Cassidy Karen KaysMyranda Failla Today's Date: 11/08/2017 Reason for consult: Follow-up assessment;1st time breastfeeding;Primapara;Term  P1 mother whose infant is ow 8 hours old.  Mother requesting latch assistance.  Baby quiet and awake; grandmother changed diaper in preparation to latch.  Assisted mother to latch infant on the right breast in the football hold.  Encouraged mother to wait for a wide open mouth before latching.  Mother's breasts are soft and non tender with short shafted nipples bilaterally.  She has breast shells and will start wearing them tomorrow and I helped her manuallly pre pump to help evert nipple.  Baby was able to latch and effectively sucked on/off for 6 minutes.  Mother felt the tugging at her breast but did not complain of pain.  She was just extremely tired during feeding.    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    Mother will call for assistance as needed during the night,  Baby became sleepy and mother wanted to sleep after initial short feeding session.  Father doing STS while mother rests.  Mother with no further questions/concerns at this time.  RN updated.   Maternal Data Formula Feeding for Exclusion: No Has patient been taught Hand Expression?: Yes Does the patient have breastfeeding experience prior to this delivery?: No  Feeding Feeding Type: Breast Fed Length of feed: 15 min  LATCH Score Latch: Repeated attempts needed to sustain latch, nipple held in mouth throughout feeding, stimulation needed to elicit sucking reflex.  Audible Swallowing: None  Type of Nipple: Everted at rest and after stimulation(short shafted bilaterally)  Comfort (Breast/Nipple): Soft / non-tender  Hold (Positioning): Assistance needed to correctly position infant at breast and maintain latch.  LATCH Score:  6  Interventions Interventions: Breast feeding basics reviewed;Assisted with latch;Skin to skin;Breast massage;Hand express;Position options;Support pillows;Adjust position;Breast compression;Shells;Hand pump  Lactation Tools Discussed/Used Tools: Shells   Consult Status Consult Status: Follow-up Date: 11/09/17 Follow-up type: In-patient    Beth R DelFava 11/08/2017, 10:59 PM

## 2017-11-09 ENCOUNTER — Encounter (HOSPITAL_COMMUNITY): Payer: Self-pay | Admitting: Obstetrics and Gynecology

## 2017-11-09 LAB — CBC
HCT: 21.7 % — ABNORMAL LOW (ref 36.0–46.0)
Hemoglobin: 7.1 g/dL — ABNORMAL LOW (ref 12.0–15.0)
MCH: 26.4 pg (ref 26.0–34.0)
MCHC: 32.7 g/dL (ref 30.0–36.0)
MCV: 80.7 fL (ref 78.0–100.0)
Platelets: 250 10*3/uL (ref 150–400)
RBC: 2.69 MIL/uL — ABNORMAL LOW (ref 3.87–5.11)
RDW: 14 % (ref 11.5–15.5)
WBC: 15.9 10*3/uL — ABNORMAL HIGH (ref 4.0–10.5)

## 2017-11-09 LAB — HEMOGLOBIN AND HEMATOCRIT, BLOOD
HCT: 24.2 % — ABNORMAL LOW (ref 36.0–46.0)
Hemoglobin: 8 g/dL — ABNORMAL LOW (ref 12.0–15.0)

## 2017-11-09 MED ORDER — SODIUM CHLORIDE 0.9% IV SOLUTION
Freq: Once | INTRAVENOUS | Status: AC
  Administered 2017-11-09: 10 mL/h via INTRAVENOUS

## 2017-11-09 MED ORDER — RHO D IMMUNE GLOBULIN 1500 UNIT/2ML IJ SOSY
300.0000 ug | PREFILLED_SYRINGE | Freq: Once | INTRAMUSCULAR | Status: AC
  Administered 2017-11-09: 300 ug via INTRAVENOUS
  Filled 2017-11-09: qty 2

## 2017-11-09 NOTE — Progress Notes (Signed)
Parent request formula to supplement breast feeding due to__mothers choice_. Parents have been informed of small tummy size of newborn, taught hand expression and understands the possible consequences of formula to the health of the infant. The possible consequences shared with patient include 1) Loss of confidence in breastfeeding 2) Engorgement 3) Allergic sensitization of baby(asthma/allergies) and 4) decreased milk supply for mother.After discussion of the above the mother decided to __supplement. The tool used to give formula supplement will be (curved tip syringe,feeding tube with gloved finger,cup,spoon,SNS)__curve tip_.Mother counseled to avoid artificial nipples because this practice may lead to latch difficulties,inadequate milk transfer and nipple soreness.  Misty L May, RN

## 2017-11-09 NOTE — Progress Notes (Signed)
Spoke to Western & Southern FinancialCNM Clemmons about pt's blood transfusion.

## 2017-11-09 NOTE — Progress Notes (Signed)
RN dicussed walking and removing cath with patient. Mother of patient stated that she could not get up and the cath would not be removed until the patients HGB was above 8. RN educated the mom that the Hgb was at 8. RN notified Waynette ButteryGreer, CNM. Midwife ordered to d/c ABT and cath can be removed as long as patient is asymptomatic and moving.   Misty L May, RN

## 2017-11-09 NOTE — Progress Notes (Signed)
Pt complain of breast soreness. Mother of patient stated that she was engorged and that she needed to start treatment for engorgement. Breast were soft, no signs of cracks or lumps. I educated patient and mother of patient on signs of engorgement. I suggested coconut oil and wearing the breast shells to help with the flat nipples. Mother of patient stated that the baby needed to have a tube feeding because the baby takes a while to latch and is hurting the mom. When I stated that I would do other interventions first, the mother of the patient stated that she was "certified" and the baby should have a tube feeding like they did in NICU. I educated on other options and provided coconut oil/set up DEBP to use with the curved tip syringe. During education and demonstrating, the mother of patient stated that she was certified in 2002 and was unaware that there were other tools. RN encouraged mom to wear breast shells for flat nipples and to apply coconut oil. Call for help with latching baby first and then pump after if she was concerned about the intake.   Misty L May, RN

## 2017-11-09 NOTE — Anesthesia Postprocedure Evaluation (Signed)
Anesthesia Post Note  Patient: Cassidy Myers  Procedure(s) Performed: CESAREAN SECTION (N/A )     Patient location during evaluation: Mother Baby Anesthesia Type: Epidural Level of consciousness: awake Pain management: satisfactory to patient Vital Signs Assessment: post-procedure vital signs reviewed and stable Respiratory status: spontaneous breathing Cardiovascular status: stable Anesthetic complications: no    Last Vitals:  Vitals:   11/09/17 0105 11/09/17 0521  BP: 116/75 107/76  Pulse: 65 76  Resp: 16   Temp: 36.8 C   SpO2:  96%    Last Pain:  Vitals:   11/09/17 0521  TempSrc:   PainSc: 4    Pain Goal:                 Cephus ShellingBURGER,LINDA

## 2017-11-09 NOTE — Progress Notes (Signed)
CSW received consult for hx of Anxiety and Depression.  CSW met with MOB, MGM and FOB to offer support and complete assessment.   MOB stated that we could talk now, even though she was getting blood and stated she was very tired.  She stated "I'll be tired tomorrow too."  She gave permission to speak openly with her mother and FOB present.   MOB shared her labor and delivery story, which started as an IOL and ended in a c-section for failure to progress, per patient.  She states she hated being pregnant and is glad it is over.  She states there were only a few PNVs that she didn't receive news of need for further testing or a complication.  She states baby is "perfect" and that she is so happy that she is here and heathy.   MOB reports that she felt "drained" emotionally throughout the pregnancy.  She acknowledges a hx of anxiety and states she has taken an antidepressant "off and on" for years.  She states she started Zoloft during pregnancy, but stopped, feeling like she didn't need it any longer.  She states she has had a counselor in the past.  Her mother added, "that woman you didn't like."  Her mother claims to be in school at [employer] and feels disgusted at the area OB practices for not allowing her to hold a Postpartum Depression support group in their offices.  She adds, "this hospital doesn't even have one."  CSW informed her that this hospital does have one, and it is called "Mom Talk."  CSW explained that it is facilitated by a Nurse Educator, but very much mom led.  CSW explained that it used to be called "Feelings After Birth" prior to "Mom Talk."  MOB explained that she has had 7 children and has experienced PPD and that it is very important to take care of oneself after delivery.  CSW agreed and tried to shift the conversation back to MOB.  MOB seemed only minimally interested in talking about PMADs or her own anxiety symptoms.  She states she copes without medication, but cannot  think of any of her coping mechanisms right now because she is too tired.  CSW encouraged her to do so when she is feeling better. CSW provided education regarding perinatal mood disorders and discussed the importance of monitoring for symptoms and speaking with a medical professional if symptoms arise.  MOB states she feels very comfortable talking with her OB providers if she has concerns at any time.  She states she is already planning to talk with her CNM now that she has delivered about starting an anxiety medication.  CSW recommends self-evaluation during the postpartum time period using the New Mom Checklist from Postpartum Progress, as well as the Edinburgh Postpartum Depression Scale, which MOB has not yet completed.     CSW provided review of Sudden Infant Death Syndrome (SIDS) precautions.  MGM said, "I've been sleeping with my babies since 1995."  CSW looked at MOB and said, "please don't intentionally sleep with your baby.  Babies die unintentionally too often in our own community simply because they slept in bed with their caregiver."  MOB reports that she lives with her mother and that her mother had a baby 18 months ago.  She states she has all necessary items because of this and from gifts from friends.  MOB states she has a bassinet for baby. CSW identifies no further need for intervention and no   barriers to discharge at this time. 

## 2017-11-09 NOTE — Progress Notes (Signed)
Factual information regarding the medical condition (Anemia) and the need for a blood transfusion was discussed with the patient, who verbalized understanding. The therapeutic rationale, benefits, risks and potential complications were discussed, including the infection or other unforeseen complications.  Informed consent was given, as well as a request to proceed with 2 units PRBC's

## 2017-11-09 NOTE — Progress Notes (Addendum)
Subjective: Postpartum Day 1: Cesarean Delivery for failure to progress Patient reports that pain is well-managed.Lochia normal.  Not ambulatory and foley catheter is still in.   Absent bowel movement. Discussed receiving 2 units PRBC's. Pt consented to blood administration.  Objective: Vital signs in last 24 hours: Temp:  [98 F (36.7 C)-101.9 F (38.8 C)] 98 F (36.7 C) (06/25 0820) Pulse Rate:  [56-124] 63 (06/25 0820) Resp:  [12-21] 18 (06/25 0820) BP: (80-126)/(42-88) 113/72 (06/25 0820) SpO2:  [94 %-98 %] 98 % (06/25 0820)  Physical Exam:  General: alert Lochia: appropriate Uterine Fundus: firm and appropriately tender Incision: dressing dry and clean DVT Evaluation: No evidence of DVT seen on physical exam. Edema +1  Recent Labs    11/08/17 1958 11/09/17 0535  HGB 7.3* 7.1*  HCT 22.9* 21.7*    Assessment/Plan: Status post Cesarean section. Symptomatic anemia 2 units PRBC's Post transfusion h&H 4 hours later Continue current care. Anticipate discharge Day 3  [name] A Clemmons CNM 11/09/2017, 9:50 AM

## 2017-11-09 NOTE — Addendum Note (Signed)
Addendum  created 11/09/17 0755 by Algis GreenhouseBurger, Linda A, CRNA   Sign clinical note

## 2017-11-09 NOTE — Progress Notes (Signed)
Offered to get patient up to walk. Pain is 2. Pt received percocet at 0820. Pt states she is too exhausted to walk. Pt was assisted with breast feeding. Family is at the bedside. Pt requests to talk to her OB doctor. After breast feeding. Pt plans to nap.

## 2017-11-10 LAB — TYPE AND SCREEN
ABO/RH(D): O NEG
Antibody Screen: NEGATIVE
Unit division: 0
Unit division: 0
Unit division: 0
Unit division: 0

## 2017-11-10 LAB — BPAM RBC
Blood Product Expiration Date: 201907082359
Blood Product Expiration Date: 201907082359
Blood Product Expiration Date: 201907132359
Blood Product Expiration Date: 201907162359
ISSUE DATE / TIME: 201906251208
ISSUE DATE / TIME: 201906251616
Unit Type and Rh: 9500
Unit Type and Rh: 9500
Unit Type and Rh: 9500
Unit Type and Rh: 9500

## 2017-11-10 LAB — RH IG WORKUP (INCLUDES ABO/RH)
ABO/RH(D): O NEG
Fetal Screen: NEGATIVE
Gestational Age(Wks): 39.2
Unit division: 0

## 2017-11-10 NOTE — Progress Notes (Signed)
Pt up to bathroom multiple times with RN assistance. PT refuses to walk and has been assisted with steady.

## 2017-11-10 NOTE — Lactation Note (Signed)
This note was copied from a baby's chart. Lactation Consultation Note  Patient Name: Cassidy Myers [name] Today's Date: 11/10/2017 Reason for consult: Follow-up assessment  Visited with P1 Mom, baby 6051 hrs old.  GMOB holding baby and baby sucking on her finger.   Offered to assist with positioning and latching.  GMOB talkative and MOB is very calm and focused. Mom positioned baby with guidance in football hold on right breast.  Mom with large breasts, and short erect nipples.  Baby rooting, Mom shown how to sandwich breast and securely hold baby's head to bring baby onto breast. Hand expressed colostrum easily. Baby able to attain a deep latch, but falls asleep.  When baby comes off breast, she fusses again.  GMOB talking the entire time.  Lights dimmed to decrease stimulation as baby was crying.   Baby would not stay latched, a few swallows identified.  After 10 mins of baby on and off, EBM given to baby by pace bottle feed.   Encouraged Mom to offer breast after EBM as baby would be more settled.  Mom to pump both breasts after each breastfeeding.   Encouraged more breast milk and less formula.   Baby is at 7% weight loss.    Talked about OP lactation appointment after discharge.  Mom interested.  Referral sent to OP clinic.   Feeding Feeding Type: Breast Milk Length of feed: 10 min(on and off)  LATCH Score Latch: Repeated attempts needed to sustain latch, nipple held in mouth throughout feeding, stimulation needed to elicit sucking reflex.  Audible Swallowing: A few with stimulation  Type of Nipple: Everted at rest and after stimulation(short nipple shaft)  Comfort (Breast/Nipple): Filling, red/small blisters or bruises, mild/mod discomfort  Hold (Positioning): Assistance needed to correctly position infant at breast and maintain latch.  LATCH Score: 6  Interventions Interventions: Breast feeding basics reviewed;Assisted with latch;Skin to skin;Breast massage;Hand  express;Breast compression;Adjust position;Support pillows;Position options;Expressed milk;DEBP  Lactation Tools Discussed/Used Tools: Pump;Bottle Breast pump type: Double-Electric Breast Pump   Consult Status Consult Status: Follow-up Date: 11/11/17 Follow-up type: In-patient    Judee ClaraSmith, Caroline E 11/10/2017, 6:04 PM

## 2017-11-10 NOTE — Progress Notes (Addendum)
Subjective: Postpartum Day 2: Cesarean Delivery Patient reports tolerating PO, + flatus and + BM.    RN reports patient has been up to bathroom 3x with Toniann KetSara Steady. Last night patient assured she could have catheter removed and get up ad lib if her hemoglobin was 8 or more and she was asymptomatic. She is asymptomatic now but refusing to ambulate for the RNs so foley still in place.   Objective: Vitals:   11/09/17 1624 11/09/17 1643 11/09/17 1900 11/09/17 2221  BP: 111/63 104/68 133/85 110/66  Pulse: 85 77 78   Resp: 18 18 18 18   Temp: 97.8 F (36.6 C) 98.4 F (36.9 C) 98 F (36.7 C) (!) 97.5 F (36.4 C)  TempSrc: Oral Oral  Oral  SpO2:      Weight:      Height:        Physical Exam:  General: alert and cooperative Lochia: appropriate Uterine Fundus: firm Incision: healing well, no significant drainage, no dehiscence, no significant erythema DVT Evaluation: No evidence of DVT seen on physical exam. Negative Homan's sign. No cords or calf tenderness. No significant calf/ankle edema.  Recent Labs    11/09/17 0535 11/09/17 2310  HGB 7.1* 8.0*  HCT 21.7* 24.2*    Assessment/Plan: Status post Cesarean section. Doing well postoperatively.  Continue current care.  Janeece RiggersEllis K Greer 11/10/2017, 12:10 AM  Rounded on the patient again per Dr. Madalyn RobPinn's request. Patient states she is feeling well, has no questions, and is excited to discharge home tomorrow.  [name] 8:29 PM 11/10/17

## 2017-11-11 DIAGNOSIS — D509 Iron deficiency anemia, unspecified: Secondary | ICD-10-CM | POA: Diagnosis present

## 2017-11-11 MED ORDER — IBUPROFEN 600 MG PO TABS: 600.0000 mg | ORAL_TABLET | Freq: Four times a day (QID) | ORAL | 0 refills | Status: DC

## 2017-11-11 MED ORDER — HYDROCORTISONE 1 % EX CREA
TOPICAL_CREAM | Freq: Three times a day (TID) | CUTANEOUS | Status: DC | PRN
  Administered 2017-11-11: 03:00:00 via TOPICAL
  Filled 2017-11-11: qty 28

## 2017-11-11 MED ORDER — OXYCODONE-ACETAMINOPHEN 5-325 MG PO TABS: 1.0000 | ORAL_TABLET | ORAL | 0 refills | Status: DC | PRN

## 2017-11-11 MED ORDER — FERROUS SULFATE 325 (65 FE) MG PO TABS: 325.0000 mg | ORAL_TABLET | Freq: Two times a day (BID) | ORAL | 2 refills | Status: DC

## 2017-11-11 NOTE — Discharge Summary (Signed)
OB Discharge Summary     Patient Name: Cassidy Myers DOB: 08/05/1995 MRN: [id number]  Date of admission: 11/07/2017 Delivering MD: Jaymes GraffILLARD, NAIMA   Date of discharge: 11/11/2017  Admitting diagnosis: 39 wk induction Intrauterine pregnancy: 7367w2d     Secondary diagnosis:  Active Problems:   Ehlers-Danlos syndrome   Anemia, iron deficiency  Additional problems: Carylon PerchesEhlers- Danlos ; Received 2 units PRBC's     Discharge diagnosis: Term Pregnancy Delivered and Anemia        polyhydramnios                                                                                        Post partum procedures:blood transfusion  Augmentation: Pitocin, Cytotec and Foley Balloon  Complications: None  Hospital course:  Induction of Labor With Cesarean Section  22 y.o. yo G1P1001 at 5967w2d was admitted to the hospital 11/07/2017 for induction of labor. Patient had a labor course significant for FTP. The patient went for cesarean section due to Arrest of Dilation, and delivered a Viable infant,11/08/2017  Membrane Rupture Time/Date: 10:24 AM ,11/08/2017   Details of operation can be found in separate operative Note.  Patient had an uncomplicated postpartum course. She is ambulating, tolerating a regular diet, passing flatus, and urinating well.  Patient is discharged home in stable condition on 11/11/17.                                    Physical exam  Vitals:   11/09/17 2221 11/10/17 1430 11/10/17 2242 11/11/17 0604  BP: 110/66 116/73 114/76 117/76  Pulse:  64 88 80  Resp: 18 20 19 18   Temp: (!) 97.5 F (36.4 C) 98 F (36.7 C) 98.1 F (36.7 C) 98.3 F (36.8 C)  TempSrc: Oral Oral Oral Oral  SpO2:  99% 97%   Weight:      Height:       General: alert, cooperative and no distress Lochia: appropriate Uterine Fundus: firm Incision: Dressing is clean, dry, and intact DVT Evaluation: No evidence of DVT seen on physical exam. Labs: Lab Results  Component Value Date   WBC 15.9 (H) 11/09/2017   HGB 8.0 (L) 11/09/2017   HCT 24.2 (L) 11/09/2017   MCV 80.7 11/09/2017   PLT 250 11/09/2017   CMP Latest Ref Rng & Units 09/21/2017  Glucose 65 - 99 mg/dL 86  BUN 6 - 20 mg/dL 10  Creatinine 4.090.44 - 8.111.00 mg/dL 9.140.44  Sodium 782135 - 956145 mmol/L 136  Potassium 3.5 - 5.1 mmol/L 3.8  Chloride 101 - 111 mmol/L 107  CO2 22 - 32 mmol/L 22  Calcium 8.9 - 10.3 mg/dL 8.9  Total Protein 6.5 - 8.1 g/dL 6.6  Total Bilirubin 0.3 - 1.2 mg/dL 0.5  Alkaline Phos 38 - 126 U/L 68  AST 15 - 41 U/L 16  ALT 14 - 54 U/L 12(L)    Discharge instruction: per After Visit Summary and "Baby and Me Booklet".  After visit meds:  Allergies as of 11/11/2017      Reactions   Latex Itching, Other (  See Comments)   Redness.   Sulfa Antibiotics Nausea And Vomiting   Shellfish Allergy Nausea Only, Rash      Medication List    STOP taking these medications   acetaminophen 500 MG tablet Commonly known as:  TYLENOL     TAKE these medications   famotidine 20 MG tablet Commonly known as:  PEPCID Take 20 mg by mouth 2 (two) times daily.   ferrous sulfate 325 (65 FE) MG tablet Take 1 tablet (325 mg total) by mouth 2 (two) times daily with a meal.   ibuprofen 600 MG tablet Commonly known as:  ADVIL,MOTRIN Take 1 tablet (600 mg total) by mouth every 6 (six) hours.   oxyCODONE-acetaminophen 5-325 MG tablet Commonly known as:  PERCOCET/ROXICET Take 1 tablet by mouth every 4 (four) hours as needed (pain scale 4-7).   prenatal multivitamin Tabs tablet Take 1 tablet by mouth daily at 12 noon.       Diet: routine diet  Activity: Advance as tolerated. Pelvic rest for 6 weeks.   Outpatient follow up:2 weeks Follow up Appt: Future Appointments  Date Time Provider Department Center  12/09/2017 10:10 AM Drema Dallas, DO [facility] None   Follow up Visit:No follow-ups on file.  Postpartum contraception: Undecided  Newborn Data: Live born female  Birth Weight: 7 lb 13.4 oz (3555 g) APGAR: 8, 8  Newborn  Delivery   Birth date/time:  11/08/2017 14:56:00 Delivery type:  C-Section, Low Transverse Trial of labor:  Yes C-section categorization:  Primary     Baby Feeding: Breast Disposition:home with mother   11/11/2017 Rhea Pink, CNM

## 2017-11-11 NOTE — Discharge Instructions (Signed)
Iron Deficiency Anemia, Adult °Iron-deficiency anemia is when you have a low amount of red blood cells or hemoglobin. This happens because you have too little iron in your body. Hemoglobin carries oxygen to parts of the body. Anemia can cause your body to not get enough oxygen. It may or may not cause symptoms. °Follow these instructions at home: °Medicines °· Take over-the-counter and prescription medicines only as told by your doctor. This includes iron pills (supplements) and vitamins. °· If you cannot handle taking iron pills by mouth, ask your doctor about getting iron through: °? A vein (intravenously). °? A shot (injection) into a muscle. °· Take iron pills when your stomach is empty. If you cannot handle this, take them with food. °· Do not drink milk or take antacids at the same time as your iron pills. °· To prevent trouble pooping (constipation), eat fiber or take medicine (stool softener) as told by your doctor. °Eating and drinking °· Talk with your doctor before changing the foods you eat. He or she may tell you to eat foods that have a lot of iron, such as: °? Liver. °? Lowfat (lean) beef. °? Breads and cereals that have iron added to them (fortified breads and cereals). °? Eggs. °? Dried fruit. °? Dark green, leafy vegetables. °· Drink enough fluid to keep your pee (urine) clear or pale yellow. °· Eat fresh fruits and vegetables that are high in vitamin C. They help your body to use iron. Foods with a lot of vitamin C include: °? Oranges. °? Peppers. °? Tomatoes. °? Mangoes. °General instructions °· Return to your normal activities as told by your doctor. Ask your doctor what activities are safe for you. °· Keep yourself clean, and keep things clean around you (your surroundings). Anemia can make you get sick more easily. °· Keep all follow-up visits as told by your doctor. This is important. °Contact a doctor if: °· You feel sick to your stomach (nauseous). °· You throw up (vomit). °· You feel  weak. °· You are sweating for no clear reason. °· You have trouble pooping, such as: °? Pooping (having a bowel movement) less than 3 times a week. °? Straining to poop. °? Having poop that is hard, dry, or larger than normal. °? Feeling full or bloated. °? Pain in the lower belly. °? Not feeling better after pooping. °Get help right away if: °· You pass out (faint). If this happens, do not drive yourself to the hospital. Call your local emergency services (911 in the [place] °· You have chest pain. °· You have shortness of breath that: °? Is very bad. °? Gets worse with physical activity. °· You have a fast heartbeat. °· You get light-headed when getting up from sitting or lying down. °This information is not intended to replace advice given to you by your health care provider. Make sure you discuss any questions you have with your health care provider. °Document Released: 06/06/2010 Document Revised: 01/22/2016 Document Reviewed: 01/22/2016 °Elsevier Interactive Patient Education © 2017 [employer] °Postpartum Care After Cesarean Delivery °The period of time right after you deliver your newborn is called the postpartum period. °What kind of medical care will I receive? °· You may continue to receive fluids and medicines through an IV tube inserted into one of your veins. °· You may have small, flexible tube (catheter) draining urine from your bladder into a bag outside of your body. The catheter will be removed as soon as possible. °· You may be   may be given a squirt bottle to use when you go to the bathroom. You may use this until you are comfortable wiping as usual. To use the squirt bottle, follow these steps: ? Before you urinate, fill the squirt bottle with warm water. The water should be warm. Do not use hot water. ? After you urinate, while you are sitting on the toilet, use the squirt bottle to rinse the area around your urethra and vaginal opening. This rinses away any urine and blood. ? You may do this  instead of wiping. As you start healing, you may use the squirt bottle before wiping yourself. Make sure to wipe gently. ? Fill the squirt bottle with clean water every time you use the bathroom.  You will be given sanitary pads to wear.  Your incision will be monitored to make sure it is healing properly. You will be told when it is safe for your stitches, staples, or skin adhesive tape to be removed. What can I expect?  You may not feel the need to urinate for several hours after delivery.  You will have some soreness and pain in your abdomen. You may have a small amount of blood or clear fluid coming from your incision.  If you are breastfeeding, you may have uterine contractions every time you breastfeed for up to several weeks postpartum. Uterine contractions help your uterus return to its normal size.  It is normal to have vaginal bleeding (lochia) after delivery. The amount and appearance of lochia is often similar to a menstrual period in the first week after delivery. It will gradually decrease over the next few weeks to a dry, yellow-brown discharge. For most women, lochia stops completely by 6-8 weeks after delivery. Vaginal bleeding can vary from woman to woman.  Within the first few days after delivery, you may have breast engorgement. This is when your breasts feel heavy, full, and uncomfortable. Your breasts may also throb and feel hard, tightly stretched, warm, and tender. After this occurs, you may have milk leaking from your breasts.Your health care provider can help you relieve discomfort due to breast engorgement. Breast engorgement should go away within a few days.  You may feel more sad or worried than normal due to hormonal changes after delivery. These feelings should not last more than a few days. If these feelings do not go away after several days, speak with your health care provider. How should I care for myself?  Tell your health care provider if you have pain or  discomfort.  Drink enough water to keep your urine clear or pale yellow.  Wash your hands thoroughly with soap and water for at least 20 seconds after changing your sanitary pads or using the toilet, and before holding or feeding your baby.  If you are not breastfeeding, avoid touching your breasts a lot. Doing this can make your breasts produce more milk.  If you become weak or lightheaded, or you feel like you might faint, ask for help before: ? Getting out of bed. ? Showering.  Change your sanitary pads frequently. Watch for any changes in your flow, such as a sudden increase in volume, a change in color, or the passing of large blood clots. If you pass a blood clot from your vagina, save it to show to your health care provider. Do not flush blood clots down the toilet without having your health care provider look at them.  Make sure that all your vaccinations are up to date. This  can help protect you and your baby from getting certain diseases. You may need to have immunizations done before you leave the hospital.  If desired, talk with your health care provider about methods of family planning or birth control (contraception). How can I start bonding with my baby? Spending as much time as possible with your baby is very important. During this time, you and your baby can get to know each other and develop a bond. Having your baby stay with you in your room (rooming in) can give you time to get to know your baby. Rooming in can also help you become comfortable caring for your baby. Breastfeeding can also help you bond with your baby. How can I plan for returning home with my baby?  Make sure that you have a car seat installed in your vehicle. ? Your car seat should be checked by a certified car seat installer to make sure that it is installed safely. ? Make sure that your baby fits into the car seat safely.  Ask your health care provider any questions you have about caring for yourself or  your baby. Make sure that you are able to contact your health care provider with any questions after leaving the hospital. This information is not intended to replace advice given to you by your health care provider. Make sure you discuss any questions you have with your health care provider. Document Released: 01/27/2012 Document Revised: 10/07/2015 Document Reviewed: 04/08/2015 Elsevier Interactive Patient Education  2018 [employer]   Postpartum Care After Cesarean Delivery The period of time right after you deliver your newborn is called the postpartum period. What kind of medical care will I receive?  You may continue to receive fluids and medicines through an IV tube inserted into one of your veins.  You may have small, flexible tube (catheter) draining urine from your bladder into a bag outside of your body. The catheter will be removed as soon as possible.  You may be given a squirt bottle to use when you go to the bathroom. You may use this until you are comfortable wiping as usual. To use the squirt bottle, follow these steps: ? Before you urinate, fill the squirt bottle with warm water. The water should be warm. Do not use hot water. ? After you urinate, while you are sitting on the toilet, use the squirt bottle to rinse the area around your urethra and vaginal opening. This rinses away any urine and blood. ? You may do this instead of wiping. As you start healing, you may use the squirt bottle before wiping yourself. Make sure to wipe gently. ? Fill the squirt bottle with clean water every time you use the bathroom.  You will be given sanitary pads to wear.  Your incision will be monitored to make sure it is healing properly. You will be told when it is safe for your stitches, staples, or skin adhesive tape to be removed. What can I expect?  You may not feel the need to urinate for several hours after delivery.  You will have some soreness and pain in your abdomen. You may  have a small amount of blood or clear fluid coming from your incision.  If you are breastfeeding, you may have uterine contractions every time you breastfeed for up to several weeks postpartum. Uterine contractions help your uterus return to its normal size.  It is normal to have vaginal bleeding (lochia) after delivery. The amount and appearance of lochia is often  similar to a menstrual period in the first week after delivery. It will gradually decrease over the next few weeks to a dry, yellow-brown discharge. For most women, lochia stops completely by 6-8 weeks after delivery. Vaginal bleeding can vary from woman to woman.  Within the first few days after delivery, you may have breast engorgement. This is when your breasts feel heavy, full, and uncomfortable. Your breasts may also throb and feel hard, tightly stretched, warm, and tender. After this occurs, you may have milk leaking from your breasts.Your health care provider can help you relieve discomfort due to breast engorgement. Breast engorgement should go away within a few days.  You may feel more sad or worried than normal due to hormonal changes after delivery. These feelings should not last more than a few days. If these feelings do not go away after several days, speak with your health care provider. How should I care for myself?  Tell your health care provider if you have pain or discomfort.  Drink enough water to keep your urine clear or pale yellow.  Wash your hands thoroughly with soap and water for at least 20 seconds after changing your sanitary pads or using the toilet, and before holding or feeding your baby.  If you are not breastfeeding, avoid touching your breasts a lot. Doing this can make your breasts produce more milk.  If you become weak or lightheaded, or you feel like you might faint, ask for help before: ? Getting out of bed. ? Showering.  Change your sanitary pads frequently. Watch for any changes in your flow,  such as a sudden increase in volume, a change in color, or the passing of large blood clots. If you pass a blood clot from your vagina, save it to show to your health care provider. Do not flush blood clots down the toilet without having your health care provider look at them.  Make sure that all your vaccinations are up to date. This can help protect you and your baby from getting certain diseases. You may need to have immunizations done before you leave the hospital.  If desired, talk with your health care provider about methods of family planning or birth control (contraception). How can I start bonding with my baby? Spending as much time as possible with your baby is very important. During this time, you and your baby can get to know each other and develop a bond. Having your baby stay with you in your room (rooming in) can give you time to get to know your baby. Rooming in can also help you become comfortable caring for your baby. Breastfeeding can also help you bond with your baby. How can I plan for returning home with my baby?  Make sure that you have a car seat installed in your vehicle. ? Your car seat should be checked by a certified car seat installer to make sure that it is installed safely. ? Make sure that your baby fits into the car seat safely.  Ask your health care provider any questions you have about caring for yourself or your baby. Make sure that you are able to contact your health care provider with any questions after leaving the hospital. This information is not intended to replace advice given to you by your health care provider. Make sure you discuss any questions you have with your health care provider. Document Released: 01/27/2012 Document Revised: 10/07/2015 Document Reviewed: 04/08/2015 Elsevier Interactive Patient Education  2018 [employer]. Home Care Instructions  for Mom ACTIVITY  Gradually return to your regular activities.  Let yourself rest. Nap while  your baby sleeps.  Avoid lifting anything that is heavier than 10 lb (4.5 kg) until your health care provider says it is okay.  Avoid activities that take a lot of effort and energy (are strenuous) until approved by your health care provider. Walking at a slow-to-moderate pace is usually safe.  If you had a cesarean delivery: ? Do not vacuum, climb stairs, or drive a car for 4-6 weeks. ? Have someone help you at home until you feel like you can do your usual activities yourself. ? Do exercises as told by your health care provider, if this applies.  VAGINAL BLEEDING You may continue to bleed for 4-6 weeks after delivery. Over time, the amount of blood usually decreases and the color of the blood usually gets lighter. However, the flow of bright red blood may increase if you have been too active. If you need to use more than one pad in an hour because your pad gets soaked, or if you pass a large clot:  Lie down.  Raise your feet.  Place a cold compress on your lower abdomen.  Rest.  Call your health care provider.  If you are breastfeeding, your period should return anytime between 8 weeks after delivery and the time that you stop breastfeeding. If you are not breastfeeding, your period should return 6-8 weeks after delivery. PERINEAL CARE The perineal area, or perineum, is the part of your body between your thighs. After delivery, this area needs special care. Follow these instructions as told by your health care provider.  Take warm tub baths for 15-20 minutes.  Use medicated pads and pain-relieving sprays and creams as told.  Do not use tampons or douches until vaginal bleeding has stopped.  Each time you go to the bathroom: ? Use a peri bottle. ? Change your pad. ? Use towelettes in place of toilet paper until your stitches have healed.  Do Kegel exercises every day. Kegel exercises help to maintain the muscles that support the vagina, bladder, and bowels. You can do these  exercises while you are standing, sitting, or lying down. To do Kegel exercises: ? Tighten the muscles of your abdomen and the muscles that surround your birth canal. ? Hold for a few seconds. ? Relax. ? Repeat until you have done this 5 times in a row.  To prevent hemorrhoids from developing or getting worse: ? Drink enough fluid to keep your urine clear or pale yellow. ? Avoid straining when having a bowel movement. ? Take over-the-counter medicines and stool softeners as told by your health care provider.  BREAST CARE  Wear a tight-fitting bra.  Avoid taking over-the-counter pain medicine for breast discomfort.  Apply ice to the breasts to help with discomfort as needed: ? Put ice in a plastic bag. ? Place a towel between your skin and the bag. ? Leave the ice on for 20 minutes or as told by your health care provider.  NUTRITION  Eat a well-balanced diet.  Do not try to lose weight quickly by cutting back on calories.  Take your prenatal vitamins until your postpartum checkup or until your health care provider tells you to stop.  POSTPARTUM DEPRESSION You may find yourself crying for no apparent reason and unable to cope with all of the changes that come with having a newborn. This mood is called postpartum depression. Postpartum depression happens because your hormone levels change  after delivery. If you have postpartum depression, get support from your partner, friends, and family. If the depression does not go away on its own after several weeks, contact your health care provider. BREAST SELF-EXAM Do a breast self-exam each month, at the same time of the month. If you are breastfeeding, check your breasts just after a feeding, when your breasts are less full. If you are breastfeeding and your period has started, check your breasts on day 5, 6, or 7 of your period. Report any lumps, bumps, or discharge to your health care provider. Know that breasts are normally lumpy if you  are breastfeeding. This is temporary, and it is not a health risk. INTIMACY AND SEXUALITY Avoid sexual activity for at least 3-4 weeks after delivery or until the brownish-red vaginal flow is completely gone. If you want to avoid pregnancy, use some form of birth control. You can get pregnant after delivery, even if you have not had your period. SEEK MEDICAL CARE IF:  You feel unable to cope with the changes that a child brings to your life, and these feelings do not go away after several weeks.  You notice a lump, a bump, or discharge on your breast.  SEEK IMMEDIATE MEDICAL CARE IF:  Blood soaks your pad in 1 hour or less.  You have: ? Severe pain or cramping in your lower abdomen. ? A bad-smelling vaginal discharge. ? A fever that is not controlled by medicine. ? A fever, and an area of your breast is red and sore. ? Pain or redness in your calf. ? Sudden, severe chest pain. ? Shortness of breath. ? Painful or bloody urination. ? Problems with your vision.  You vomit for 12 hours or longer.  You develop a severe headache.  You have serious thoughts about hurting yourself, your child, or anyone else.  This information is not intended to replace advice given to you by your health care provider. Make sure you discuss any questions you have with your health care provider. Document Released: 05/01/2000 Document Revised: 10/10/2015 Document Reviewed: 11/05/2014 Elsevier Interactive Patient Education  2017 [employer].

## 2017-11-11 NOTE — Progress Notes (Addendum)
During hourly rounding MOB and baby both asleep in the bed. RN educated pt on safe sleep practice in the crib while mom is sleeping. RN placed baby in crib, MGM removed baby from crib and placed baby back with mom. MOB requesting a cream to apply to her rash on her stomach. Kathalene FramesEllis Greer, CNM notified and working on getting an order put in for that.

## 2017-11-11 NOTE — Progress Notes (Signed)
Pt requesting another bottle of formula for baby. RN educated pt on importance of still pumping when feeding baby a bottle of formula. Pt last had baby to breast at 2215 for 5 min feed. MOB says she is too uncomfortable to pump at this time.  MOB has been very tearful when asked if she wanted to put baby to breast and says ''she can't right now ''. Pt has requested formula.  MGM said that the pt is hurting with this gas. Pt refused assistance with breastfeeding. RN reminded pt that lactation will be back in the morning if she needed assistance. MGM says that pt was man handled when being assisted with BF. MOB says she would let them know if she needed some help.

## 2017-11-11 NOTE — Plan of Care (Signed)
Encouraged to call for help with breast feeding. Pt ambulating in room and showered.

## 2017-11-12 ENCOUNTER — Inpatient Hospital Stay (HOSPITAL_COMMUNITY)
Admission: AD | Admit: 2017-11-12 | Discharge: 2017-11-13 | Disposition: A | Discharge status: Home / Self Care | Payer: 59 | Source: Ambulatory Visit | Attending: Obstetrics & Gynecology | Admitting: Obstetrics & Gynecology

## 2017-11-12 ENCOUNTER — Encounter (HOSPITAL_COMMUNITY): Payer: Self-pay | Admitting: *Deleted

## 2017-11-12 DIAGNOSIS — L7682 Other postprocedural complications of skin and subcutaneous tissue: Secondary | ICD-10-CM

## 2017-11-12 DIAGNOSIS — F53 Postpartum depression: Secondary | ICD-10-CM

## 2017-11-12 DIAGNOSIS — G8918 Other acute postprocedural pain: Secondary | ICD-10-CM | POA: Diagnosis not present

## 2017-11-12 DIAGNOSIS — M7989 Other specified soft tissue disorders: Secondary | ICD-10-CM | POA: Diagnosis present

## 2017-11-12 DIAGNOSIS — R509 Fever, unspecified: Secondary | ICD-10-CM | POA: Diagnosis present

## 2017-11-12 DIAGNOSIS — O99345 Other mental disorders complicating the puerperium: Secondary | ICD-10-CM

## 2017-11-12 DIAGNOSIS — D509 Iron deficiency anemia, unspecified: Secondary | ICD-10-CM | POA: Diagnosis not present

## 2017-11-12 LAB — URINALYSIS, ROUTINE W REFLEX MICROSCOPIC
Bilirubin Urine: NEGATIVE
Glucose, UA: NEGATIVE mg/dL
Ketones, ur: NEGATIVE mg/dL
Nitrite: NEGATIVE
Protein, ur: NEGATIVE mg/dL
RBC / HPF: >50 RBC/hpf — ABNORMAL HIGH (ref 0–5)
Specific Gravity, Urine: 1.011 (ref 1.005–1.030)
pH: 7 (ref 5.0–8.0)

## 2017-11-12 LAB — CBC WITH DIFFERENTIAL/PLATELET
Basophils Absolute: 0 10*3/uL (ref 0.0–0.1)
Basophils Relative: 0 %
Eosinophils Absolute: 0.4 10*3/uL (ref 0.0–0.7)
Eosinophils Relative: 3 %
HCT: 24.3 % — ABNORMAL LOW (ref 36.0–46.0)
Hemoglobin: 7.8 g/dL — ABNORMAL LOW (ref 12.0–15.0)
Lymphocytes Relative: 19 %
Lymphs Abs: 2.2 10*3/uL (ref 0.7–4.0)
MCH: 26.8 pg (ref 26.0–34.0)
MCHC: 32.1 g/dL (ref 30.0–36.0)
MCV: 83.5 fL (ref 78.0–100.0)
Monocytes Absolute: 0.7 10*3/uL (ref 0.1–1.0)
Monocytes Relative: 6 %
Neutro Abs: 8.3 10*3/uL — ABNORMAL HIGH (ref 1.7–7.7)
Neutrophils Relative %: 72 %
Platelets: 287 10*3/uL (ref 150–400)
RBC: 2.91 MIL/uL — ABNORMAL LOW (ref 3.87–5.11)
RDW: 16.4 % — ABNORMAL HIGH (ref 11.5–15.5)
WBC: 11.7 10*3/uL — ABNORMAL HIGH (ref 4.0–10.5)

## 2017-11-12 LAB — COMPREHENSIVE METABOLIC PANEL
ALT: 12 U/L (ref 0–44)
AST: 20 U/L (ref 15–41)
Albumin: 2.2 g/dL — ABNORMAL LOW (ref 3.5–5.0)
Alkaline Phosphatase: 94 U/L (ref 38–126)
Anion gap: 9 (ref 5–15)
BUN: 9 mg/dL (ref 6–20)
CO2: 23 mmol/L (ref 22–32)
Calcium: 8.8 mg/dL — ABNORMAL LOW (ref 8.9–10.3)
Chloride: 106 mmol/L (ref 98–111)
Creatinine, Ser: 0.61 mg/dL (ref 0.44–1.00)
GFR calc Af Amer: >60 mL/min (ref >60)
GFR calc non Af Amer: >60 mL/min (ref >60)
Glucose, Bld: 92 mg/dL (ref 70–99)
Potassium: 4 mmol/L (ref 3.5–5.1)
Sodium: 138 mmol/L (ref 135–145)
Total Bilirubin: 0.7 mg/dL (ref 0.3–1.2)
Total Protein: 5.6 g/dL — ABNORMAL LOW (ref 6.5–8.1)

## 2017-11-12 LAB — URIC ACID: Uric Acid, Serum: 3.7 mg/dL (ref 2.5–7.1)

## 2017-11-12 MED ORDER — SERTRALINE HCL 50 MG PO TABS: 50.0000 mg | ORAL_TABLET | Freq: Every day | ORAL | 2 refills | Status: DC

## 2017-11-12 NOTE — MAU Provider Note (Signed)
Chief Complaint: Fever; Foot Swelling; and Nausea   SUBJECTIVE HPI: Cassidy Myers is a 22 y.o. G1P1001 at Unknown who presents to MAU s/p POD#4 from a primary LTCS for FTP. Pt presented with c/o x1 day of fatigue, increased swelling in lower extremities, HA, and blurred vision from time to time, sharp shooting pain worse with movement in right side of incision, feeling lightheaded, increased falling asleep, and crying nonstop since she was d/c from the hospital. Pt denies feeling depressed, pt able to take care of her baby and wants to. Pt here with mother and mother very concerned about her. Pt endorses her last BM was this morning.  Pt endorses having dysuria since foley was taken out 2 days ago in-pt. Pt has had H/O POTS and at 1 day Postop, Hgb 7.1 and received 2 units RBCs. Pt level back to a Hgb of 8.5 which was her pre delivery hgb. Pt struggles with anemia and is taking iron for it. Pt last took one percocet and ibu at 1500 today.    Past Medical History:  Diagnosis Date  . ADHD   . Allergy   . Anxiety   . Depression   . Dysautonomia (HCC)   . Ehlers-Danlos syndrome   . Headache(784.0)    OB History  Gravida Para Term Preterm AB Living  1 1 1     1   SAB TAB Ectopic Multiple Live Births        0 1    # Outcome Date GA Lbr Len/2nd Weight Sex Delivery Anes PTL Lv  1 Term 11/08/17 [redacted]w[redacted]d  3.555 kg (7 lb 13.4 oz) F CS-LTranv EPI  LIV   Past Surgical History:  Procedure Laterality Date  . CESAREAN SECTION N/A 11/08/2017   Procedure: CESAREAN SECTION;  Surgeon: Jaymes Graff, MD;  Location: [facility];  Service: Obstetrics;  Laterality: N/A;  . NO PAST SURGERIES    . WISDOM TOOTH EXTRACTION     Social History   Socioeconomic History  . Marital status: Single    Spouse name: Not on file  . Number of children: Not on file  . Years of education: 33  . Highest education level: Not on file  Occupational History  . Occupation: Consulting civil engineer   Social Needs  . Financial resource  strain: Not on file  . Food insecurity:    Worry: Not on file    Inability: Not on file  . Transportation needs:    Medical: Not on file    Non-medical: Not on file  Tobacco Use  . Smoking status: Never Smoker  . Smokeless tobacco: Never Used  Substance and Sexual Activity  . Alcohol use: No  . Drug use: No  . Sexual activity: Not Currently  Lifestyle  . Physical activity:    Days per week: Not on file    Minutes per session: Not on file  . Stress: Not on file  Relationships  . Social connections:    Talks on phone: Not on file    Gets together: Not on file    Attends religious service: Not on file    Active member of club or organization: Not on file    Attends meetings of clubs or organizations: Not on file    Relationship status: Not on file  . Intimate partner violence:    Fear of current or ex partner: Not on file    Emotionally abused: Not on file    Physically abused: Not on file    Forced  sexual activity: Not on file  Other Topics Concern  . Not on file  Social History Narrative   Regular exercise-yes   Caffeine Use-yes   No current facility-administered medications on file prior to encounter.    Current Outpatient Medications on File Prior to Encounter  Medication Sig Dispense Refill  . famotidine (PEPCID) 20 MG tablet Take 20 mg by mouth 2 (two) times daily.    . ferrous sulfate 325 (65 FE) MG tablet Take 1 tablet (325 mg total) by mouth 2 (two) times daily with a meal. 60 tablet 2  . ibuprofen (ADVIL,MOTRIN) 600 MG tablet Take 1 tablet (600 mg total) by mouth every 6 (six) hours. 30 tablet 0  . oxyCODONE-acetaminophen (PERCOCET/ROXICET) 5-325 MG tablet Take 1 tablet by mouth every 4 (four) hours as needed (pain scale 4-7). 30 tablet 0  . Prenatal Vit-Fe Fumarate-FA (PRENATAL MULTIVITAMIN) TABS tablet Take 1 tablet by mouth daily at 12 noon.     Allergies  Allergen Reactions  . Latex Itching and Other (See Comments)    Redness.  . Sulfa Antibiotics Nausea  And Vomiting  . Shellfish Allergy Nausea Only and Rash    I have reviewed the past Medical Hx, Surgical Hx, Social Hx, Allergies and Medications.   REVIEW OF SYSTEMS All systems reviewed and are negative for acute change except as noted in the HPI.   OBJECTIVE BP 133/80 (BP Location: Left Arm)   Pulse 88   Temp 98.7 F (37.1 C)   Resp 18   Ht 5\' 4"  (1.626 m)   Wt 93.4 kg (206 lb)   SpO2 98%   Breastfeeding? Yes   BMI 35.36 kg/m    PHYSICAL EXAM Constitutional: Well-developed, well-nourished female in no acute distress.  Cardiovascular: normal rate and rhythm, pulses intact Respiratory: normal rate and effort.  GI: Abd soft, non-tender, non-distended. Pos BS x 4 MS: Extremities nontender, +2 pitting edema, 2+pateller reflexes, no clonus, normal ROM Neurologic: Alert and oriented x 4. No focal deficits GU: Neg CVAT. Psych: normal mood and affect, sad appearing, with glossy eyes.   LAB RESULTS Results for orders placed or performed during the hospital encounter of 11/12/17 (from the past 24 hour(s))  CBC with Differential/Platelet     Status: Abnormal   Collection Time: 11/12/17 11:06 PM  Result Value Ref Range   WBC 11.7 (H) 4.0 - 10.5 K/uL   RBC 2.91 (L) 3.87 - 5.11 MIL/uL   Hemoglobin 7.8 (L) 12.0 - 15.0 g/dL   HCT 40.924.3 (L) 81.136.0 - 91.446.0 %   MCV 83.5 78.0 - 100.0 fL   MCH 26.8 26.0 - 34.0 pg   MCHC 32.1 30.0 - 36.0 g/dL   RDW 78.216.4 (H) 95.611.5 - 21.315.5 %   Platelets 287 150 - 400 K/uL   Neutrophils Relative % 72 %   Neutro Abs 8.3 (H) 1.7 - 7.7 K/uL   Lymphocytes Relative 19 %   Lymphs Abs 2.2 0.7 - 4.0 K/uL   Monocytes Relative 6 %   Monocytes Absolute 0.7 0.1 - 1.0 K/uL   Eosinophils Relative 3 %   Eosinophils Absolute 0.4 0.0 - 0.7 K/uL   Basophils Relative 0 %   Basophils Absolute 0.0 0.0 - 0.1 K/uL  Comprehensive metabolic panel     Status: Abnormal   Collection Time: 11/12/17 11:06 PM  Result Value Ref Range   Sodium 138 135 - 145 mmol/L   Potassium 4.0 3.5  - 5.1 mmol/L   Chloride 106 98 - 111 mmol/L  CO2 23 22 - 32 mmol/L   Glucose, Bld 92 70 - 99 mg/dL   BUN 9 6 - 20 mg/dL   Creatinine, Ser 6.21 0.44 - 1.00 mg/dL   Calcium 8.8 (L) 8.9 - 10.3 mg/dL   Total Protein 5.6 (L) 6.5 - 8.1 g/dL   Albumin 2.2 (L) 3.5 - 5.0 g/dL   AST 20 15 - 41 U/L   ALT 12 0 - 44 U/L   Alkaline Phosphatase 94 38 - 126 U/L   Total Bilirubin 0.7 0.3 - 1.2 mg/dL   GFR calc non Af Amer >60 >60 mL/min   GFR calc Af Amer >60 >60 mL/min   Anion gap 9 5 - 15  Uric acid     Status: None   Collection Time: 11/12/17 11:06 PM  Result Value Ref Range   Uric Acid, Serum 3.7 2.5 - 7.1 mg/dL  Urinalysis, Routine w reflex microscopic     Status: Abnormal   Collection Time: 11/12/17 11:22 PM  Result Value Ref Range   Color, Urine YELLOW YELLOW   APPearance HAZY (A) CLEAR   Specific Gravity, Urine 1.011 1.005 - 1.030   pH 7.0 5.0 - 8.0   Glucose, UA NEGATIVE NEGATIVE mg/dL   Hgb urine dipstick LARGE (A) NEGATIVE   Bilirubin Urine NEGATIVE NEGATIVE   Ketones, ur NEGATIVE NEGATIVE mg/dL   Protein, ur NEGATIVE NEGATIVE mg/dL   Nitrite NEGATIVE NEGATIVE   Leukocytes, UA LARGE (A) NEGATIVE   RBC / HPF >50 (H) 0 - 5 RBC/hpf   WBC, UA 21-50 0 - 5 WBC/hpf   Bacteria, UA RARE (A) NONE SEEN   Squamous Epithelial / LPF 0-5 0 - 5   Mucus PRESENT   Protein / creatinine ratio, urine     Status: Abnormal   Collection Time: 11/12/17 11:22 PM  Result Value Ref Range   Creatinine, Urine 65.00 mg/dL   Total Protein, Urine 16 mg/dL   Protein Creatinine Ratio 0.25 (H) 0.00 - 0.15 mg/mg[Cre]    IMAGING No results found.  MAU Management/MDM: Vitals and nursing notes reviewed Orders Placed This Encounter  Procedures  . Culture, Urine  . Urinalysis, Routine w reflex microscopic  . CBC with Differential/Platelet  . Comprehensive metabolic panel  . Uric acid  . Protein / creatinine ratio, urine    Meds ordered this encounter  Medications  . sertraline (ZOLOFT) 50 MG tablet     Sig: Take 1 tablet (50 mg total) by mouth daily.    Dispense:  30 tablet    Refill:  2    Order Specific Question:   Supervising Provider    Answer:   Osborn Coho [[id number]]    Plan of care reviewed with patient, including labs and tests ordered and medical treatment.  Consult with Dr Su Hilt.  Treatments in MAU included labs, pe, discharge if stable .   ASSESSMENT 1. Postpartum depression   2. Iron deficiency anemia, unspecified iron deficiency anemia type   3. Incisional pain   Pt stable and discharged home.   PLAN Discharge home in stable condition. Counseled on return precautions Handout given UC: pending Incisional pain: may continue to take motrin and percocet as needed.  Anemia: continue to take iron PO TID.  postpartum depression: start taking zoloft 50mg  QD and may increase in 25mg  in one week, report SI feeling F/U with [facility] in one week.  Follow-up Information    [facility] Follow up in 1 week(s).   Specialty:  Obstetrics  and Gynecology Why:  For PPD f/u  Contact information: [address] [place] [address] [phone]          Dale  NP-C  11/13/2017, 12:18 AM

## 2017-11-12 NOTE — MAU Note (Addendum)
[facility]. When got home had temp 100.5. My mom said I kept passing out. Swelling in lower legs. Incision hurts and having abd cramping. Headache. Took Percocet and Ibuprofen 600mg  at 1500. Had to have blood transfusion post op of 2 units

## 2017-11-12 NOTE — Discharge Instructions (Signed)
Anemia Anemia is a condition in which you do not have enough red blood cells or hemoglobin. Hemoglobin is a substance in red blood cells that carries oxygen. When you do not have enough red blood cells or hemoglobin (are anemic), your body cannot get enough oxygen and your organs may not work properly. As a result, you may feel very tired or have other problems. What are the causes? Common causes of anemia include:  Excessive bleeding. Anemia can be caused by excessive bleeding inside or outside the body, including bleeding from the intestine or from periods in women.  Poor nutrition.  Long-lasting (chronic) kidney, thyroid, and liver disease.  Bone marrow disorders.  Cancer and treatments for cancer.  HIV (human immunodeficiency virus) and AIDS (acquired immunodeficiency syndrome).  Treatments for HIV and AIDS.  Spleen problems.  Blood disorders.  Infections, medicines, and autoimmune disorders that destroy red blood cells.  What are the signs or symptoms? Symptoms of this condition include:  Minor weakness.  Dizziness.  Headache.  Feeling heartbeats that are irregular or faster than normal (palpitations).  Shortness of breath, especially with exercise.  Paleness.  Cold sensitivity.  Indigestion.  Nausea.  Difficulty sleeping.  Difficulty concentrating.  Symptoms may occur suddenly or develop slowly. If your anemia is mild, you may not have symptoms. How is this diagnosed? This condition is diagnosed based on:  Blood tests.  Your medical history.  A physical exam.  Bone marrow biopsy.  Your health care provider may also check your stool (feces) for blood and may do additional testing to look for the cause of your bleeding. You may also have other tests, including:  Imaging tests, such as a CT scan or MRI.  Endoscopy.  Colonoscopy.  How is this treated? Treatment for this condition depends on the cause. If you continue to lose a lot of blood,  you may need to be treated at a hospital. Treatment may include:  Taking supplements of iron, vitamin T02, or folic acid.  Taking a hormone medicine (erythropoietin) that can help to stimulate red blood cell growth.  Having a blood transfusion. This may be needed if you lose a lot of blood.  Making changes to your diet.  Having surgery to remove your spleen.  Follow these instructions at home:  Take over-the-counter and prescription medicines only as told by your health care provider.  Take supplements only as told by your health care provider.  Follow any diet instructions that you were given.  Keep all follow-up visits as told by your health care provider. This is important. Contact a health care provider if:  You develop new bleeding anywhere in the body. Get help right away if:  You are very weak.  You are short of breath.  You have pain in your abdomen or chest.  You are dizzy or feel faint.  You have trouble concentrating.  You have bloody or black, tarry stools.  You vomit repeatedly or you vomit up blood. Summary  Anemia is a condition in which you do not have enough red blood cells or enough of a substance in your red blood cells that carries oxygen (hemoglobin).  Symptoms may occur suddenly or develop slowly.  If your anemia is mild, you may not have symptoms.  This condition is diagnosed with blood tests as well as a medical history and physical exam. Other tests may be needed.  Treatment for this condition depends on the cause of the anemia. This information is not intended to replace advice  given to you by your health care provider. Make sure you discuss any questions you have with your health care provider. °Document Released: 06/11/2004 Document Revised: 06/05/2016 Document Reviewed: 06/05/2016 °Elsevier Interactive Patient Education © 2018 [employer] ° °

## 2017-11-13 DIAGNOSIS — F53 Postpartum depression: Secondary | ICD-10-CM | POA: Diagnosis not present

## 2017-11-13 LAB — PROTEIN / CREATININE RATIO, URINE
Creatinine, Urine: 65 mg/dL
Protein Creatinine Ratio: 0.25 mg/mg{Cre} — ABNORMAL HIGH (ref 0.00–0.15)
Total Protein, Urine: 16 mg/dL

## 2017-11-14 LAB — URINE CULTURE

## 2017-12-09 ENCOUNTER — Ambulatory Visit: Payer: 59 | Admitting: Neurology

## 2018-04-11 ENCOUNTER — Ambulatory Visit: Payer: 59 | Admitting: Internal Medicine

## 2018-04-11 DIAGNOSIS — Z0289 Encounter for other administrative examinations: Secondary | ICD-10-CM

## 2018-04-11 NOTE — Progress Notes (Deleted)
Subjective:    Patient ID: Cassidy Myers, female    DOB: 07/08/1995, 22 y.o.   MRN: [id number]  HPI  Pt presents to the clinic today with c/o swelling of her arms and legs.  Review of Systems      Past Medical History:  Diagnosis Date  . ADHD   . Allergy   . Anxiety   . Depression   . Dysautonomia (HCC)   . Ehlers-Danlos syndrome   . Headache(784.0)     Current Outpatient Medications  Medication Sig Dispense Refill  . famotidine (PEPCID) 20 MG tablet Take 20 mg by mouth 2 (two) times daily.    . ferrous sulfate 325 (65 FE) MG tablet Take 1 tablet (325 mg total) by mouth 2 (two) times daily with a meal. 60 tablet 2  . ibuprofen (ADVIL,MOTRIN) 600 MG tablet Take 1 tablet (600 mg total) by mouth every 6 (six) hours. 30 tablet 0  . oxyCODONE-acetaminophen (PERCOCET/ROXICET) 5-325 MG tablet Take 1 tablet by mouth every 4 (four) hours as needed (pain scale 4-7). 30 tablet 0  . Prenatal Vit-Fe Fumarate-FA (PRENATAL MULTIVITAMIN) TABS tablet Take 1 tablet by mouth daily at 12 noon.    . sertraline (ZOLOFT) 50 MG tablet Take 1 tablet (50 mg total) by mouth daily. 30 tablet 2   No current facility-administered medications for this visit.     Allergies  Allergen Reactions  . Latex Itching and Other (See Comments)    Redness.  . Sulfa Antibiotics Nausea And Vomiting  . Shellfish Allergy Nausea Only and Rash    Family History  Problem Relation Age of Onset  . Cancer Mother        Cervical  . Asthma Mother   . Depression Mother   . Depression Father   . Schizophrenia Father   . Depression Maternal Grandmother   . Depression Maternal Grandfather   . Depression Paternal Grandmother   . Diabetes Paternal Grandmother   . Depression Paternal Grandfather   . Diabetes Paternal Grandfather     Social History   Socioeconomic History  . Marital status: Single    Spouse name: Not on file  . Number of children: Not on file  . Years of education: 99  . Highest education  level: Not on file  Occupational History  . Occupation: Consulting civil engineertudent   Social Needs  . Financial resource strain: Not on file  . Food insecurity:    Worry: Not on file    Inability: Not on file  . Transportation needs:    Medical: Not on file    Non-medical: Not on file  Tobacco Use  . Smoking status: Never Smoker  . Smokeless tobacco: Never Used  Substance and Sexual Activity  . Alcohol use: No  . Drug use: No  . Sexual activity: Not Currently  Lifestyle  . Physical activity:    Days per week: Not on file    Minutes per session: Not on file  . Stress: Not on file  Relationships  . Social connections:    Talks on phone: Not on file    Gets together: Not on file    Attends religious service: Not on file    Active member of club or organization: Not on file    Attends meetings of clubs or organizations: Not on file    Relationship status: Not on file  . Intimate partner violence:    Fear of current or ex partner: Not on file    Emotionally abused: Not  on file    Physically abused: Not on file    Forced sexual activity: Not on file  Other Topics Concern  . Not on file  Social History Narrative   Regular exercise-yes   Caffeine Use-yes     Constitutional: Denies fever, malaise, fatigue, headache or abrupt weight changes.  HEENT: Denies eye pain, eye redness, ear pain, ringing in the ears, wax buildup, runny nose, nasal congestion, bloody nose, or sore throat. Respiratory: Denies difficulty breathing, shortness of breath, cough or sputum production.   Cardiovascular: Denies chest pain, chest tightness, palpitations or swelling in the hands or feet.  Gastrointestinal: Denies abdominal pain, bloating, constipation, diarrhea or blood in the stool.  GU: Denies urgency, frequency, pain with urination, burning sensation, blood in urine, odor or discharge. Musculoskeletal: Pt reports swelling of arms and leg. Denies decrease in range of motion, difficulty with gait, muscle pain or  joint pain.  Skin: Denies redness, rashes, lesions or ulcercations.  Neurological: Denies dizziness, difficulty with memory, difficulty with speech or problems with balance and coordination.  Psych: Denies anxiety, depression, SI/HI.  No other specific complaints in a complete review of systems (except as listed in HPI above).  Objective:   Physical Exam        Assessment & Plan:

## 2018-06-22 ENCOUNTER — Ambulatory Visit (INDEPENDENT_AMBULATORY_CARE_PROVIDER_SITE_OTHER)
Admission: RE | Admit: 2018-06-22 | Discharge: 2018-06-22 | Disposition: A | Discharge status: Home / Self Care | Payer: 59 | Source: Ambulatory Visit | Attending: Internal Medicine | Admitting: Internal Medicine

## 2018-06-22 ENCOUNTER — Encounter: Payer: Self-pay | Admitting: Internal Medicine

## 2018-06-22 ENCOUNTER — Ambulatory Visit (INDEPENDENT_AMBULATORY_CARE_PROVIDER_SITE_OTHER): Payer: 59 | Admitting: Internal Medicine

## 2018-06-22 VITALS — BP 116/74 | HR 98 | Temp 98.2°F | Wt 190.0 lb

## 2018-06-22 DIAGNOSIS — M5441 Lumbago with sciatica, right side: Secondary | ICD-10-CM | POA: Diagnosis not present

## 2018-06-22 DIAGNOSIS — M25551 Pain in right hip: Secondary | ICD-10-CM | POA: Diagnosis not present

## 2018-06-22 DIAGNOSIS — M25562 Pain in left knee: Secondary | ICD-10-CM

## 2018-06-22 DIAGNOSIS — M25552 Pain in left hip: Secondary | ICD-10-CM

## 2018-06-22 DIAGNOSIS — M25561 Pain in right knee: Secondary | ICD-10-CM | POA: Diagnosis not present

## 2018-06-22 DIAGNOSIS — G8929 Other chronic pain: Secondary | ICD-10-CM

## 2018-06-22 DIAGNOSIS — M7989 Other specified soft tissue disorders: Secondary | ICD-10-CM | POA: Diagnosis not present

## 2018-06-22 NOTE — Progress Notes (Signed)
Subjective:    Patient ID: Cassidy Myers, female    DOB: May 09, 1996, 23 y.o.   MRN: [id number]  HPI  Pt presents to the clinic today with c/o bilateral leg pain, joint stiffness and joint swelling. She reports this has been intermittent over the last. She reports her legs will swell starting from the hip all the way down. She is having some numbness in her buttocks. She reports her hips and knees ache and are stiff. She has tried an OTC diuretic with minimal relief. She has also tried Tylenol and Aleve with minimal relief.   Review of Systems      Past Medical History:  Diagnosis Date  . ADHD   . Allergy   . Anxiety   . Depression   . Dysautonomia ([place])   . Ehlers-Danlos syndrome   . CWCBJSEG(315.1)     Current Outpatient Medications  Medication Sig Dispense Refill  . ELDERBERRY PO Take by mouth.    . Prenatal Vit-Fe Fumarate-FA (PRENATAL MULTIVITAMIN) TABS tablet Take 1 tablet by mouth daily at 12 noon.     No current facility-administered medications for this visit.     Allergies  Allergen Reactions  . Latex Itching and Other (See Comments)    Redness.  . Sulfa Antibiotics Nausea And Vomiting  . Shellfish Allergy Nausea Only and Rash    Family History  Problem Relation Age of Onset  . Cancer Mother        Cervical  . Asthma Mother   . Depression Mother   . Depression Father   . Schizophrenia Father   . Depression Maternal Grandmother   . Depression Maternal Grandfather   . Depression Paternal Grandmother   . Diabetes Paternal Grandmother   . Depression Paternal Grandfather   . Diabetes Paternal Grandfather     Social History   Socioeconomic History  . Marital status: Single    Spouse name: Not on file  . Number of children: Not on file  . Years of education: 36  . Highest education level: Not on file  Occupational History  . Occupation: Ship broker   Social Needs  . Financial resource strain: Not on file  . Food insecurity:    Worry: Not on file   Inability: Not on file  . Transportation needs:    Medical: Not on file    Non-medical: Not on file  Tobacco Use  . Smoking status: Never Smoker  . Smokeless tobacco: Never Used  Substance and Sexual Activity  . Alcohol use: No  . Drug use: No  . Sexual activity: Not Currently  Lifestyle  . Physical activity:    Days per week: Not on file    Minutes per session: Not on file  . Stress: Not on file  Relationships  . Social connections:    Talks on phone: Not on file    Gets together: Not on file    Attends religious service: Not on file    Active member of club or organization: Not on file    Attends meetings of clubs or organizations: Not on file    Relationship status: Not on file  . Intimate partner violence:    Fear of current or ex partner: Not on file    Emotionally abused: Not on file    Physically abused: Not on file    Forced sexual activity: Not on file  Other Topics Concern  . Not on file  Social History Narrative   Regular exercise-yes   Caffeine Use-yes  Constitutional: Denies fever, malaise, fatigue, headache or abrupt weight changes.  Respiratory: Denies difficulty breathing, shortness of breath, cough or sputum production.   Cardiovascular: Denies chest pain, chest tightness, palpitations or swelling in the hands or feet.  Gastrointestinal: Denies abdominal pain, bloating, constipation, diarrhea or blood in the stool.  GU: Denies urgency, frequency, pain with urination, burning sensation, blood in urine, odor or discharge. Musculoskeletal: Pt reports bilateral hip pain, bilateral knee pain, and swelling of BLE. Denies decrease in range of motion, difficulty with gait, muscle pain. Skin: Denies redness, rashes, lesions or ulcercations.  Neurological: Pt reports numbness in buttocks. Denies dizziness, difficulty with memory, difficulty with speech or problems with balance and coordination.    No other specific complaints in a complete review of systems  (except as listed in HPI above).  Objective:   Physical Exam    BP 116/74   Pulse 98   Temp 98.2 F (36.8 C) (Oral)   Wt 190 lb (86.2 kg)   LMP 06/22/2018   SpO2 98%   Breastfeeding Yes   BMI 32.61 kg/m  Wt Readings from Last 3 Encounters:  06/22/18 190 lb (86.2 kg)  11/12/17 206 lb (93.4 kg)  11/07/17 208 lb 11.2 oz (94.7 kg)    General: Appearsher stated age, obese in NAD. Skin: Warm, dry and intact.  Cardiovascular: Normal rate and rhythm. S1,S2 noted.  No murmur, rubs or gallops noted. No JVD or BLE edema.  Pulmonary/Chest: Normal effort and positive vesicular breath sounds. No respiratory distress. No wheezes, rales or ronchi noted. . Musculoskeletal: Normal flexion, extension and rotation of the spine. Bony tenderness noted with palpation over the spine. Crepitus noted with flexion and extension of the knees. Normal flexion, extension, internal and external rotation of the hips.  No signs of joint swelling. No difficulty with gait.  Neurological: Alert and oriented.    BMET    Component Value Date/Time   NA 138 11/12/2017 2306   K 4.0 11/12/2017 2306   CL 106 11/12/2017 2306   CO2 23 11/12/2017 2306   GLUCOSE 92 11/12/2017 2306   BUN 9 11/12/2017 2306   CREATININE 0.61 11/12/2017 2306   CALCIUM 8.8 (L) 11/12/2017 2306   GFRNONAA >60 11/12/2017 2306   GFRAA >60 11/12/2017 2306    Lipid Panel  No results found for: CHOL, TRIG, HDL, CHOLHDL, VLDL, LDLCALC  CBC    Component Value Date/Time   WBC 11.7 (H) 11/12/2017 2306   RBC 2.91 (L) 11/12/2017 2306   HGB 7.8 (L) 11/12/2017 2306   HCT 24.3 (L) 11/12/2017 2306   PLT 287 11/12/2017 2306   MCV 83.5 11/12/2017 2306   MCH 26.8 11/12/2017 2306   MCHC 32.1 11/12/2017 2306   RDW 16.4 (H) 11/12/2017 2306   LYMPHSABS 2.2 11/12/2017 2306   MONOABS 0.7 11/12/2017 2306   EOSABS 0.4 11/12/2017 2306   BASOSABS 0.0 11/12/2017 2306    Hgb A1C No results found for: HGBA1C        Assessment & Plan:    Bilateral Leg Swelling, Bilateral Hip Pain, Bilateral Knee Pain, 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

## 2018-06-22 NOTE — Patient Instructions (Signed)

## 2018-06-22 NOTE — Addendum Note (Signed)
Addended by: Varney Biles on: 06/22/2018 04:09 PM   Modules accepted: Orders

## 2018-06-23 LAB — CBC
HCT: 35.9 % — ABNORMAL LOW (ref 36.0–46.0)
Hemoglobin: 11.9 g/dL — ABNORMAL LOW (ref 12.0–15.0)
MCHC: 33.2 g/dL (ref 30.0–36.0)
MCV: 82.6 fl (ref 78.0–100.0)
Platelets: 314 10*3/uL (ref 150.0–400.0)
RBC: 4.35 Mil/uL (ref 3.87–5.11)
RDW: 14.4 % (ref 11.5–15.5)
WBC: 8.7 10*3/uL (ref 4.0–10.5)

## 2018-06-23 LAB — COMPREHENSIVE METABOLIC PANEL
ALT: 10 U/L (ref 0–35)
AST: 18 U/L (ref 0–37)
Albumin: 4.2 g/dL (ref 3.5–5.2)
Alkaline Phosphatase: 90 U/L (ref 39–117)
BUN: 12 mg/dL (ref 6–23)
CO2: 27 mEq/L (ref 19–32)
Calcium: 9 mg/dL (ref 8.4–10.5)
Chloride: 106 mEq/L (ref 96–112)
Creatinine, Ser: 0.61 mg/dL (ref 0.40–1.20)
GFR: 123.51 mL/min (ref >60.00)
Glucose, Bld: 78 mg/dL (ref 70–99)
Potassium: 4.3 mEq/L (ref 3.5–5.1)
Sodium: 138 mEq/L (ref 135–145)
Total Bilirubin: 0.5 mg/dL (ref 0.2–1.2)
Total Protein: 7.4 g/dL (ref 6.0–8.3)

## 2018-06-23 LAB — SEDIMENTATION RATE: Sed Rate: 18 mm/hr (ref 0–20)

## 2018-06-23 LAB — HIGH SENSITIVITY CRP: CRP, High Sensitivity: 4.5 mg/L (ref 0.000–5.000)

## 2018-06-23 LAB — VITAMIN D 25 HYDROXY (VIT D DEFICIENCY, FRACTURES): VITD: 22.56 ng/mL — ABNORMAL LOW (ref 30.00–100.00)

## 2018-06-24 ENCOUNTER — Telehealth: Payer: Self-pay | Admitting: Internal Medicine

## 2018-06-24 LAB — ANA: Anti Nuclear Antibody(ANA): POSITIVE — AB

## 2018-06-24 LAB — ANTI-NUCLEAR AB-TITER (ANA TITER): ANA Titer 1: 1:320 {titer} — ABNORMAL HIGH

## 2018-06-24 MED ORDER — HYDROCHLOROTHIAZIDE 12.5 MG PO CAPS: 12.5000 mg | ORAL_CAPSULE | Freq: Every day | ORAL | 0 refills | Status: DC

## 2018-06-24 NOTE — Telephone Encounter (Signed)
Best number [phone]  Pt returned your call

## 2018-06-24 NOTE — Addendum Note (Signed)
Addended by: Roena Malady on: 06/24/2018 04:43 PM   Modules accepted: Orders

## 2018-06-28 ENCOUNTER — Other Ambulatory Visit: Payer: Self-pay | Admitting: Internal Medicine

## 2018-06-28 DIAGNOSIS — M2550 Pain in unspecified joint: Secondary | ICD-10-CM

## 2018-06-28 DIAGNOSIS — M7989 Other specified soft tissue disorders: Secondary | ICD-10-CM

## 2018-06-28 DIAGNOSIS — R768 Other specified abnormal immunological findings in serum: Secondary | ICD-10-CM

## 2018-06-28 DIAGNOSIS — Q796 Ehlers-Danlos syndrome, unspecified: Secondary | ICD-10-CM

## 2018-06-28 MED ORDER — MELOXICAM 7.5 MG PO TABS: 7.5000 mg | ORAL_TABLET | Freq: Every day | ORAL | 2 refills | Status: DC

## 2018-06-29 NOTE — Addendum Note (Signed)
Addended by: Roena Malady on: 06/29/2018 02:13 PM   Modules accepted: Orders

## 2018-07-04 ENCOUNTER — Other Ambulatory Visit (INDEPENDENT_AMBULATORY_CARE_PROVIDER_SITE_OTHER): Payer: 59

## 2018-07-04 DIAGNOSIS — M25551 Pain in right hip: Secondary | ICD-10-CM

## 2018-07-04 DIAGNOSIS — M7989 Other specified soft tissue disorders: Secondary | ICD-10-CM

## 2018-07-04 DIAGNOSIS — G8929 Other chronic pain: Secondary | ICD-10-CM

## 2018-07-04 DIAGNOSIS — M25562 Pain in left knee: Secondary | ICD-10-CM

## 2018-07-04 DIAGNOSIS — M25561 Pain in right knee: Secondary | ICD-10-CM

## 2018-07-04 DIAGNOSIS — M2550 Pain in unspecified joint: Secondary | ICD-10-CM

## 2018-07-04 DIAGNOSIS — M5441 Lumbago with sciatica, right side: Secondary | ICD-10-CM

## 2018-07-04 DIAGNOSIS — M25552 Pain in left hip: Secondary | ICD-10-CM

## 2018-07-04 LAB — BASIC METABOLIC PANEL
BUN: 9 mg/dL (ref 6–23)
CO2: 28 mEq/L (ref 19–32)
Calcium: 9.2 mg/dL (ref 8.4–10.5)
Chloride: 104 mEq/L (ref 96–112)
Creatinine, Ser: 0.67 mg/dL (ref 0.40–1.20)
GFR: 110.8 mL/min (ref >60.00)
Glucose, Bld: 103 mg/dL — ABNORMAL HIGH (ref 70–99)
Potassium: 4 mEq/L (ref 3.5–5.1)
Sodium: 139 mEq/L (ref 135–145)

## 2018-07-05 LAB — RHEUMATOID FACTOR: Rhuematoid fact SerPl-aCnc: <14 IU/mL (ref <14)

## 2018-08-04 ENCOUNTER — Other Ambulatory Visit: Payer: Self-pay | Admitting: Internal Medicine

## 2019-01-11 ENCOUNTER — Telehealth: Payer: Self-pay | Admitting: Internal Medicine

## 2019-01-11 NOTE — Telephone Encounter (Signed)
noted 

## 2019-01-11 NOTE — Telephone Encounter (Signed)
Rheumatology referral Denied/Closed. Rejection Reason - Patient was No Show - "Patient was a "no show" on 08-01-18, and she has not called back to reschedule."" [facility]

## 2019-01-24 ENCOUNTER — Encounter: Payer: Self-pay | Admitting: Internal Medicine

## 2019-01-24 ENCOUNTER — Other Ambulatory Visit: Payer: Self-pay

## 2019-01-24 ENCOUNTER — Ambulatory Visit (INDEPENDENT_AMBULATORY_CARE_PROVIDER_SITE_OTHER): Payer: 59 | Admitting: Internal Medicine

## 2019-01-24 VITALS — BP 114/78 | HR 87 | Temp 98.1°F | Ht 64.5 in | Wt 209.0 lb

## 2019-01-24 DIAGNOSIS — Z23 Encounter for immunization: Secondary | ICD-10-CM

## 2019-01-24 DIAGNOSIS — R635 Abnormal weight gain: Secondary | ICD-10-CM

## 2019-01-24 DIAGNOSIS — M2550 Pain in unspecified joint: Secondary | ICD-10-CM | POA: Diagnosis not present

## 2019-01-24 DIAGNOSIS — R768 Other specified abnormal immunological findings in serum: Secondary | ICD-10-CM | POA: Diagnosis not present

## 2019-01-24 DIAGNOSIS — F329 Major depressive disorder, single episode, unspecified: Secondary | ICD-10-CM

## 2019-01-24 DIAGNOSIS — D509 Iron deficiency anemia, unspecified: Secondary | ICD-10-CM

## 2019-01-24 DIAGNOSIS — Q796 Ehlers-Danlos syndrome, unspecified: Secondary | ICD-10-CM | POA: Diagnosis not present

## 2019-01-24 DIAGNOSIS — F419 Anxiety disorder, unspecified: Secondary | ICD-10-CM | POA: Diagnosis not present

## 2019-01-24 DIAGNOSIS — Z0000 Encounter for general adult medical examination without abnormal findings: Secondary | ICD-10-CM | POA: Diagnosis not present

## 2019-01-24 MED ORDER — FLUOXETINE HCL 10 MG PO CAPS: 10.0000 mg | ORAL_CAPSULE | Freq: Every day | ORAL | 1 refills | Status: DC

## 2019-01-24 NOTE — Assessment & Plan Note (Signed)
Support offered today Will trial Fluoxetine 10 mg daily  Update me in 6-8 weeks and let me know how you are doing

## 2019-01-24 NOTE — Patient Instructions (Signed)
Health Maintenance, Female Adopting a healthy lifestyle and getting preventive care are important in promoting health and wellness. Ask your health care provider about:  The right schedule for you to have regular tests and exams.  Things you can do on your own to prevent diseases and keep yourself healthy. What should I know about diet, weight, and exercise? Eat a healthy diet   Eat a diet that includes plenty of vegetables, fruits, low-fat dairy products, and lean protein.  Do not eat a lot of foods that are high in solid fats, added sugars, or sodium. Maintain a healthy weight Body mass index (BMI) is used to identify weight problems. It estimates body fat based on height and weight. Your health care provider can help determine your BMI and help you achieve or maintain a healthy weight. Get regular exercise Get regular exercise. This is one of the most important things you can do for your health. Most adults should:  Exercise for at least 150 minutes each week. The exercise should increase your heart rate and make you sweat (moderate-intensity exercise).  Do strengthening exercises at least twice a week. This is in addition to the moderate-intensity exercise.  Spend less time sitting. Even light physical activity can be beneficial. Watch cholesterol and blood lipids Have your blood tested for lipids and cholesterol at 22 years of age, then have this test every 5 years. Have your cholesterol levels checked more often if:  Your lipid or cholesterol levels are high.  You are older than 22 years of age.  You are at high risk for heart disease. What should I know about cancer screening? Depending on your health history and family history, you may need to have cancer screening at various ages. This may include screening for:  Breast cancer.  Cervical cancer.  Colorectal cancer.  Skin cancer.  Lung cancer. What should I know about heart disease, diabetes, and high blood  pressure? Blood pressure and heart disease  High blood pressure causes heart disease and increases the risk of stroke. This is more likely to develop in people who have high blood pressure readings, are of African descent, or are overweight.  Have your blood pressure checked: ? Every 3-5 years if you are 18-39 years of age. ? Every year if you are 40 years old or older. Diabetes Have regular diabetes screenings. This checks your fasting blood sugar level. Have the screening done:  Once every three years after age 40 if you are at a normal weight and have a low risk for diabetes.  More often and at a younger age if you are overweight or have a high risk for diabetes. What should I know about preventing infection? Hepatitis B If you have a higher risk for hepatitis B, you should be screened for this virus. Talk with your health care provider to find out if you are at risk for hepatitis B infection. Hepatitis C Testing is recommended for:  Everyone born from 1945 through 1965.  Anyone with known risk factors for hepatitis C. Sexually transmitted infections (STIs)  Get screened for STIs, including gonorrhea and chlamydia, if: ? You are sexually active and are younger than 22 years of age. ? You are older than 22 years of age and your health care provider tells you that you are at risk for this type of infection. ? Your sexual activity has changed since you were last screened, and you are at increased risk for chlamydia or gonorrhea. Ask your health care provider if   you are at risk.  Ask your health care provider about whether you are at high risk for HIV. Your health care provider may recommend a prescription medicine to help prevent HIV infection. If you choose to take medicine to prevent HIV, you should first get tested for HIV. You should then be tested every 3 months for as long as you are taking the medicine. Pregnancy  If you are about to stop having your period (premenopausal) and  you may become pregnant, seek counseling before you get pregnant.  Take 400 to 800 micrograms (mcg) of folic acid every day if you become pregnant.  Ask for birth control (contraception) if you want to prevent pregnancy. Osteoporosis and menopause Osteoporosis is a disease in which the bones lose minerals and strength with aging. This can result in bone fractures. If you are 65 years old or older, or if you are at risk for osteoporosis and fractures, ask your health care provider if you should:  Be screened for bone loss.  Take a calcium or vitamin D supplement to lower your risk of fractures.  Be given hormone replacement therapy (HRT) to treat symptoms of menopause. Follow these instructions at home: Lifestyle  Do not use any products that contain nicotine or tobacco, such as cigarettes, e-cigarettes, and chewing tobacco. If you need help quitting, ask your health care provider.  Do not use street drugs.  Do not share needles.  Ask your health care provider for help if you need support or information about quitting drugs. Alcohol use  Do not drink alcohol if: ? Your health care provider tells you not to drink. ? You are pregnant, may be pregnant, or are planning to become pregnant.  If you drink alcohol: ? Limit how much you use to 0-1 drink a day. ? Limit intake if you are breastfeeding.  Be aware of how much alcohol is in your drink. In the [place] one drink equals one 12 oz bottle of beer (355 mL), one 5 oz glass of wine (148 mL), or one 1 oz glass of hard liquor (44 mL). General instructions  Schedule regular health, dental, and eye exams.  Stay current with your vaccines.  Tell your health care provider if: ? You often feel depressed. ? You have ever been abused or do not feel safe at home. Summary  Adopting a healthy lifestyle and getting preventive care are important in promoting health and wellness.  Follow your health care provider's instructions about healthy  diet, exercising, and getting tested or screened for diseases.  Follow your health care provider's instructions on monitoring your cholesterol and blood pressure. This information is not intended to replace advice given to you by your health care provider. Make sure you discuss any questions you have with your health care provider. Document Released: 11/17/2010 Document Revised: 04/27/2018 Document Reviewed: 04/27/2018 Elsevier Patient Education  2020 Elsevier Inc.  

## 2019-01-24 NOTE — Assessment & Plan Note (Signed)
Will refer back to rheum for further eval of positive ANA, joint pains Encouraged regular physical activity and stregthening

## 2019-01-24 NOTE — Assessment & Plan Note (Signed)
CBC with Diff today Consider iron panel pending results

## 2019-01-24 NOTE — Progress Notes (Signed)
Subjective:    Patient ID: Cassidy Myers, female    DOB: [birth year], 23 y.o.   MRN: [id number]  HPI  Pt presents to the clinic today for her annual exam. She is also due to follow up chronic conditions.  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 She was referred to rheumatology but per Epic, she no showed for the appt and never called to reschedule. She reports she has called multiple times. She denies subluxations or dislocations.   Iron Deficiency Anemia: Her last H/H35.9/11.9, 06/2018. She is not currently taking an iron supplement. She denies fatigue, SOB or s/s of bleeding.   Depression: Chronic dysthymia, but stable off meds. PHQ 9 score of 1. She denies SI/HI.  GAD: Persistent, worse recently due to COVID 19. She is not having panic attacks. She would like to be treated for this. She has been on Sertraline in the past but stopped it because she does not like the way it made her feel. She is not seeing a therapist.  Flu: 02/2018 Tetanus: 07/2017 Pap Smear: never Dentist: annually  Diet: She does eat lean meat. She consumes fruits more than veggies. She consumes some fried foods. She drinks mostly water, some coffee, hot tea, some soda. Exercise: None  Review of Systems  Past Medical History:  Diagnosis Date  . ADHD   . Allergy   . Anxiety   . Depression   . Dysautonomia (HCC)   . Ehlers-Danlos syndrome   . QZRAQTMA(263.3)     Current Outpatient Medications  Medication Sig Dispense Refill  . ELDERBERRY PO Take by mouth.    . hydrochlorothiazide (MICROZIDE) 12.5 MG capsule TAKE 1 CAPSULE BY MOUTH EVERY DAY 30 capsule 1  . meloxicam (MOBIC) 7.5 MG tablet Take 1 tablet (7.5 mg total) by mouth daily. 30 tablet 2  . Prenatal Vit-Fe Fumarate-FA (PRENATAL MULTIVITAMIN) TABS tablet Take 1 tablet by mouth daily at 12 noon.     No current facility-administered medications for this visit.     Allergies  Allergen Reactions  . Latex Itching and  Other (See Comments)    Redness.  . Sulfa Antibiotics Nausea And Vomiting  . Shellfish Allergy Nausea Only and Rash    Family History  Problem Relation Age of Onset  . Cancer Mother        Cervical  . Asthma Mother   . Depression Mother   . Depression Father   . Schizophrenia Father   . Depression Maternal Grandmother   . Depression Maternal Grandfather   . Depression Paternal Grandmother   . Diabetes Paternal Grandmother   . Depression Paternal Grandfather   . Diabetes Paternal Grandfather     Social History   Socioeconomic History  . Marital status: Single    Spouse name: Not on file  . Number of children: Not on file  . Years of education: 85  . Highest education level: Not on file  Occupational History  . Occupation: Consulting civil engineer   Social Needs  . Financial resource strain: Not on file  . Food insecurity    Worry: Not on file    Inability: Not on file  . Transportation needs    Medical: Not on file    Non-medical: Not on file  Tobacco Use  . Smoking status: Never Smoker  . Smokeless tobacco: Never Used  Substance and Sexual Activity  . Alcohol use: No  . Drug use: No  . Sexual activity: Not Currently  Lifestyle  . Physical  activity    Days per week: Not on file    Minutes per session: Not on file  . Stress: Not on file  Relationships  . Social Herbalist on phone: Not on file    Gets together: Not on file    Attends religious service: Not on file    Active member of club or organization: Not on file    Attends meetings of clubs or organizations: Not on file    Relationship status: Not on file  . Intimate partner violence    Fear of current or ex partner: Not on file    Emotionally abused: Not on file    Physically abused: Not on file    Forced sexual activity: Not on file  Other Topics Concern  . Not on file  Social History Narrative   Regular exercise-yes   Caffeine Use-yes     Constitutional: Pt reports abnormal weight gain. Denies  fever, malaise, fatigue, headache.  HEENT: Denies eye pain, eye redness, ear pain, ringing in the ears, wax buildup, runny nose, nasal congestion, bloody nose, or sore throat. Respiratory: Denies difficulty breathing, shortness of breath, cough or sputum production.   Cardiovascular: Denies chest pain, chest tightness, palpitations or swelling in the hands or feet.  Gastrointestinal: Denies abdominal pain, bloating, constipation, diarrhea or blood in the stool.  GU: Denies urgency, frequency, pain with urination, burning sensation, blood in urine, odor or discharge. Musculoskeletal: Pt reports intermittent hip pain. Denies decrease in range of motion, difficulty with gait, muscle pain or joint swelling.  Skin: Denies redness, rashes, lesions or ulcercations.  Neurological: Denies dizziness, difficulty with memory, difficulty with speech or problems with balance and coordination.  Psych: Pt has a history of anxiety and depression. Denies SI/HI.  No other specific complaints in a complete review of systems (except as listed in HPI above).     Objective:   Physical Exam   BP 114/78   Pulse 87   Temp 98.1 F (36.7 C) (Temporal)   Ht 5' 4.5" (1.638 m)   Wt 209 lb (94.8 kg)   LMP 01/18/2019   SpO2 98%   BMI 35.32 kg/m  Wt Readings from Last 3 Encounters:  01/24/19 209 lb (94.8 kg)  06/22/18 190 lb (86.2 kg)  11/12/17 206 lb (93.4 kg)    General: Appears her stated age, obese, in NAD. Skin: Warm, dry and intact. No rashes noted. HEENT: Head: normal shape and size; Eyes: sclera white, no icterus, conjunctiva pink, PERRLA and EOMs intact; Ears: Tm's gray and intact, normal light reflex;  Neck:  Neck supple, trachea midline. No masses, lumps or thyromegaly present.  Cardiovascular: Normal rate and rhythm. S1,S2 noted.  No murmur, rubs or gallops noted. No JVD or BLE edema.  Pulmonary/Chest: Normal effort and positive vesicular breath sounds. No respiratory distress. No wheezes, rales or  ronchi noted.  Abdomen: Soft and nontender. Normal bowel sounds. No distention or masses noted. Liver, spleen and kidneys non palpable. Musculoskeletal: Strength 5/5 BUE/BLE. No difficulty with gait.  Neurological: Alert and oriented. Cranial nerves II-XII grossly intact. Coordination normal.  Psychiatric: Mood and affect normal. Behavior is normal. Judgment and thought content normal.    BMET    Component Value Date/Time   NA 139 07/04/2018 0946   K 4.0 07/04/2018 0946   CL 104 07/04/2018 0946   CO2 28 07/04/2018 0946   GLUCOSE 103 (H) 07/04/2018 0946   BUN 9 07/04/2018 0946   CREATININE 0.67 07/04/2018 0946  CALCIUM 9.2 07/04/2018 0946   GFRNONAA >60 11/12/2017 2306   GFRAA >60 11/12/2017 2306    Lipid Panel  No results found for: CHOL, TRIG, HDL, CHOLHDL, VLDL, LDLCALC  CBC    Component Value Date/Time   WBC 8.7 06/22/2018 1610   RBC 4.35 06/22/2018 1610   HGB 11.9 (L) 06/22/2018 1610   HCT 35.9 (L) 06/22/2018 1610   PLT 314.0 06/22/2018 1610   MCV 82.6 06/22/2018 1610   MCH 26.8 11/12/2017 2306   MCHC 33.2 06/22/2018 1610   RDW 14.4 06/22/2018 1610   LYMPHSABS 2.2 11/12/2017 2306   MONOABS 0.7 11/12/2017 2306   EOSABS 0.4 11/12/2017 2306   BASOSABS 0.0 11/12/2017 2306    Hgb A1C No results found for: HGBA1C         Assessment & Plan:   Preventative Health Maintenance:  Flu shot today Tetanus UTD She will call GYN to schedule pap smear and IUD Encouraged her to consume a balanced diet and exercise regimen Advised her to see a dentist annually Will check CBC, CMET, Lipid today. She declines STD screening  Abnormal Weight Gain:  Encouraged balanced diet and 150 minutes of exercise weekly Will check TSH and Free T4  RTC in 1 year, sooner if needed Nicki Reaperegina Baity, NP

## 2019-01-25 LAB — LIPID PANEL
Cholesterol: 144 mg/dL (ref 0–200)
HDL: 30.6 mg/dL — ABNORMAL LOW (ref >39.00)
LDL Cholesterol: 88 mg/dL (ref 0–99)
NonHDL: 113.39
Total CHOL/HDL Ratio: 5
Triglycerides: 128 mg/dL (ref 0.0–149.0)
VLDL: 25.6 mg/dL (ref 0.0–40.0)

## 2019-01-25 LAB — COMPREHENSIVE METABOLIC PANEL
ALT: 10 U/L (ref 0–35)
AST: 14 U/L (ref 0–37)
Albumin: 4.1 g/dL (ref 3.5–5.2)
Alkaline Phosphatase: 96 U/L (ref 39–117)
BUN: 8 mg/dL (ref 6–23)
CO2: 26 mEq/L (ref 19–32)
Calcium: 9.2 mg/dL (ref 8.4–10.5)
Chloride: 104 mEq/L (ref 96–112)
Creatinine, Ser: 0.66 mg/dL (ref 0.40–1.20)
GFR: 112.15 mL/min (ref >60.00)
Glucose, Bld: 87 mg/dL (ref 70–99)
Potassium: 4.3 mEq/L (ref 3.5–5.1)
Sodium: 138 mEq/L (ref 135–145)
Total Bilirubin: 0.6 mg/dL (ref 0.2–1.2)
Total Protein: 7.1 g/dL (ref 6.0–8.3)

## 2019-01-25 LAB — CBC WITH DIFFERENTIAL/PLATELET
Basophils Absolute: 0.1 10*3/uL (ref 0.0–0.1)
Basophils Relative: 1 % (ref 0.0–3.0)
Eosinophils Absolute: 0.3 10*3/uL (ref 0.0–0.7)
Eosinophils Relative: 3 % (ref 0.0–5.0)
HCT: 35.5 % — ABNORMAL LOW (ref 36.0–46.0)
Hemoglobin: 11.7 g/dL — ABNORMAL LOW (ref 12.0–15.0)
Lymphocytes Relative: 31.7 % (ref 12.0–46.0)
Lymphs Abs: 2.7 10*3/uL (ref 0.7–4.0)
MCHC: 33 g/dL (ref 30.0–36.0)
MCV: 82.6 fl (ref 78.0–100.0)
Monocytes Absolute: 0.9 10*3/uL (ref 0.1–1.0)
Monocytes Relative: 11.2 % (ref 3.0–12.0)
Neutro Abs: 4.5 10*3/uL (ref 1.4–7.7)
Neutrophils Relative %: 53.1 % (ref 43.0–77.0)
Platelets: 333 10*3/uL (ref 150.0–400.0)
RBC: 4.3 Mil/uL (ref 3.87–5.11)
RDW: 13.7 % (ref 11.5–15.5)
WBC: 8.5 10*3/uL (ref 4.0–10.5)

## 2019-01-25 LAB — TSH: TSH: 1.72 u[IU]/mL (ref 0.35–4.50)

## 2019-01-25 LAB — T4, FREE: Free T4: 0.84 ng/dL (ref 0.60–1.60)

## 2019-03-15 ENCOUNTER — Encounter: Payer: Self-pay | Admitting: Internal Medicine

## 2019-03-28 ENCOUNTER — Ambulatory Visit: Payer: Medicaid Other | Admitting: Internal Medicine

## 2019-03-28 NOTE — Progress Notes (Deleted)
Subjective:    Patient ID: Cassidy Myers, female    DOB: 12/01/1995, 23 y.o.   MRN: [id number]  HPI  Pt presents to the clinic today with c/o hemorrhoid.  Review of Systems  Past Medical History:  Diagnosis Date  . ADHD   . Allergy   . Anxiety   . Depression   . Ehlers-Danlos syndrome   . DPOEUMPN(361.4)     Current Outpatient Medications  Medication Sig Dispense Refill  . ELDERBERRY PO Take by mouth.    Marland Kitchen FLUoxetine (PROZAC) 10 MG capsule Take 1 capsule (10 mg total) by mouth daily. 30 capsule 1  . Prenatal Vit-Fe Fumarate-FA (PRENATAL MULTIVITAMIN) TABS tablet Take 1 tablet by mouth daily at 12 noon.     No current facility-administered medications for this visit.     Allergies  Allergen Reactions  . Latex Itching and Other (See Comments)    Redness.  . Sulfa Antibiotics Nausea And Vomiting  . Shellfish Allergy Nausea Only and Rash    Family History  Problem Relation Age of Onset  . Cancer Mother        Cervical  . Asthma Mother   . Depression Mother   . Depression Father   . Schizophrenia Father   . Depression Maternal Grandmother   . Depression Maternal Grandfather   . Depression Paternal Grandmother   . Diabetes Paternal Grandmother   . Depression Paternal Grandfather   . Diabetes Paternal Grandfather     Social History   Socioeconomic History  . Marital status: Single    Spouse name: Not on file  . Number of children: Not on file  . Years of education: 37  . Highest education level: Not on file  Occupational History  . Occupation: Ship broker   Social Needs  . Financial resource strain: Not on file  . Food insecurity    Worry: Not on file    Inability: Not on file  . Transportation needs    Medical: Not on file    Non-medical: Not on file  Tobacco Use  . Smoking status: Never Smoker  . Smokeless tobacco: Never Used  Substance and Sexual Activity  . Alcohol use: No  . Drug use: No  . Sexual activity: Not Currently  Lifestyle  .  Physical activity    Days per week: Not on file    Minutes per session: Not on file  . Stress: Not on file  Relationships  . Social Herbalist on phone: Not on file    Gets together: Not on file    Attends religious service: Not on file    Active member of club or organization: Not on file    Attends meetings of clubs or organizations: Not on file    Relationship status: Not on file  . Intimate partner violence    Fear of current or ex partner: Not on file    Emotionally abused: Not on file    Physically abused: Not on file    Forced sexual activity: Not on file  Other Topics Concern  . Not on file  Social History Narrative   Regular exercise-yes   Caffeine Use-yes     Constitutional: Denies fever, malaise, fatigue, headache or abrupt weight changes.  HEENT: Denies eye pain, eye redness, ear pain, ringing in the ears, wax buildup, runny nose, nasal congestion, bloody nose, or sore throat. Respiratory: Denies difficulty breathing, shortness of breath, cough or sputum production.   Cardiovascular: Denies chest pain, chest  tightness, palpitations or swelling in the hands or feet.  Gastrointestinal: Denies abdominal pain, bloating, constipation, diarrhea or blood in the stool.  GU: Denies urgency, frequency, pain with urination, burning sensation, blood in urine, odor or discharge. Musculoskeletal: Denies decrease in range of motion, difficulty with gait, muscle pain or joint pain and swelling.  Skin: Denies redness, rashes, lesions or ulcercations.  Neurological: Denies dizziness, difficulty with memory, difficulty with speech or problems with balance and coordination.  Psych: Denies anxiety, depression, SI/HI.  No other specific complaints in a complete review of systems (except as listed in HPI above).     Objective:   Physical Exam  There were no vitals taken for this visit. Wt Readings from Last 3 Encounters:  01/24/19 209 lb (94.8 kg)  06/22/18 190 lb (86.2  kg)  11/12/17 206 lb (93.4 kg)    General: Appears her stated age, well developed, well nourished in NAD. Skin: Warm, dry and intact. No rashes, lesions or ulcerations noted. HEENT: Head: normal shape and size; Eyes: sclera white, no icterus, conjunctiva pink, PERRLA and EOMs intact; Ears: Tm's gray and intact, normal light reflex; Nose: mucosa pink and moist, septum midline; Throat/Mouth: Teeth present, mucosa pink and moist, no exudate, lesions or ulcerations noted.  Neck:  Neck supple, trachea midline. No masses, lumps or thyromegaly present.  Cardiovascular: Normal rate and rhythm. S1,S2 noted.  No murmur, rubs or gallops noted. No JVD or BLE edema. No carotid bruits noted. Pulmonary/Chest: Normal effort and positive vesicular breath sounds. No respiratory distress. No wheezes, rales or ronchi noted.  Abdomen: Soft and nontender. Normal bowel sounds. No distention or masses noted. Liver, spleen and kidneys non palpable. Musculoskeletal: Normal range of motion. No signs of joint swelling. No difficulty with gait.  Neurological: Alert and oriented. Cranial nerves II-XII grossly intact. Coordination normal.  Psychiatric: Mood and affect normal. Behavior is normal. Judgment and thought content normal.   EKG:  BMET    Component Value Date/Time   NA 138 01/24/2019 1458   K 4.3 01/24/2019 1458   CL 104 01/24/2019 1458   CO2 26 01/24/2019 1458   GLUCOSE 87 01/24/2019 1458   BUN 8 01/24/2019 1458   CREATININE 0.66 01/24/2019 1458   CALCIUM 9.2 01/24/2019 1458   GFRNONAA >60 11/12/2017 2306   GFRAA >60 11/12/2017 2306    Lipid Panel     Component Value Date/Time   CHOL 144 01/24/2019 1458   TRIG 128.0 01/24/2019 1458   HDL 30.60 (L) 01/24/2019 1458   CHOLHDL 5 01/24/2019 1458   VLDL 25.6 01/24/2019 1458   LDLCALC 88 01/24/2019 1458    CBC    Component Value Date/Time   WBC 8.5 01/24/2019 1458   RBC 4.30 01/24/2019 1458   HGB 11.7 (L) 01/24/2019 1458   HCT 35.5 (L)  01/24/2019 1458   PLT 333.0 01/24/2019 1458   MCV 82.6 01/24/2019 1458   MCH 26.8 11/12/2017 2306   MCHC 33.0 01/24/2019 1458   RDW 13.7 01/24/2019 1458   LYMPHSABS 2.7 01/24/2019 1458   MONOABS 0.9 01/24/2019 1458   EOSABS 0.3 01/24/2019 1458   BASOSABS 0.1 01/24/2019 1458    Hgb A1C No results found for: HGBA1C          Assessment & Plan:  Hemorroids

## 2019-05-31 ENCOUNTER — Ambulatory Visit: Payer: Medicaid Other | Attending: Internal Medicine

## 2019-05-31 DIAGNOSIS — Z20822 Contact with and (suspected) exposure to covid-19: Secondary | ICD-10-CM

## 2019-06-02 LAB — NOVEL CORONAVIRUS, NAA: SARS-CoV-2, NAA: NOT DETECTED

## 2019-06-12 ENCOUNTER — Ambulatory Visit: Payer: 59 | Attending: Internal Medicine

## 2019-06-12 DIAGNOSIS — Z20822 Contact with and (suspected) exposure to covid-19: Secondary | ICD-10-CM

## 2019-06-13 LAB — NOVEL CORONAVIRUS, NAA: SARS-CoV-2, NAA: NOT DETECTED

## 2019-07-11 ENCOUNTER — Other Ambulatory Visit: Payer: Self-pay

## 2019-07-11 ENCOUNTER — Ambulatory Visit (INDEPENDENT_AMBULATORY_CARE_PROVIDER_SITE_OTHER): Payer: 59

## 2019-07-11 DIAGNOSIS — Z111 Encounter for screening for respiratory tuberculosis: Secondary | ICD-10-CM | POA: Diagnosis not present

## 2019-07-11 NOTE — Progress Notes (Signed)
Per orders of Nicki Reaper, NP-C, TB Skin Test given by Roena Malady.

## 2019-07-13 LAB — TB SKIN TEST
Induration: 0 mm
TB Skin Test: NEGATIVE

## 2019-07-16 ENCOUNTER — Telehealth: Payer: 59 | Admitting: Emergency Medicine

## 2019-07-16 DIAGNOSIS — R112 Nausea with vomiting, unspecified: Secondary | ICD-10-CM

## 2019-07-16 MED ORDER — ONDANSETRON HCL 4 MG PO TABS: 4.0000 mg | ORAL_TABLET | Freq: Three times a day (TID) | ORAL | 0 refills | Status: DC | PRN

## 2019-07-16 NOTE — Progress Notes (Signed)
For the safety of you and your child, I recommend a face to face office visit with a health care provider.  Many mothers need to take medicines during their pregnancy and while nursing.  Almost all medicines pass into the breast milk in small quantities.  Most are generally considered safe for a mother to take but some medicines must be avoided.  After reviewing your E-Visit request, I recommend that you consult your OB/GYN or pediatrician for medical advice in relation to your condition and prescription medications while pregnant or breastfeeding. NOTE: If you entered your credit card information for this eVisit, you will not be charged. You may see a "hold" on your card for the $35 but that hold will drop off and you will not have a charge processed.  If you are having a true medical emergency please call 911.     For an urgent face to face visit, [facility] has four urgent care centers for your convenience:    NEW:  [facility] [place] [phone] [address] [place], [place] [address] .  Monday - Friday 10 am - 6 pm     . [facility]    [phone]                  Get Driving Directions  [address] [place], [place] [address] . 10 am to 8 pm Monday-Friday . 12 pm to 8 pm Saturday-Sunday   . [facility] at [facility]  [phone]                  Get Driving Directions  [address] [place], [place] [address] . 8 am to 8 pm Monday-Friday . 9 am to 6 pm Saturday . 11 am to 6 pm Sunday   . [facility] at [facility]  [phone]                  Get Driving Directions   [address] [place], [place] [address] . 8 am to 8 pm Monday-Friday . 8 am to 4 pm Saturday-Sunday    . [facility] at [facility] Directions  [phone]  [address] [place], [place] [address]  . Monday-Friday, 12 PM  to 6 PM    Your e-visit answers were reviewed by a board certified advanced clinical practitioner to complete your personal care plan.  Thank you for using e-Visits.   Greater than 5 but less than 10 minutes spent researching, coordinating, and implementing care for this patient today

## 2019-07-16 NOTE — Progress Notes (Signed)
We are sorry that you are not feeling well. Here is how we plan to help!  Based on what you have shared with me it looks like you have a Virus that is irritating your GI tract.  Vomiting is the forceful emptying of a portion of the stomach's content through the mouth.  Although nausea and vomiting can make you feel miserable, it's important to remember that these are not diseases, but rather symptoms of an underlying illness.  When we treat short term symptoms, we always caution that any symptoms that persist should be fully evaluated in a medical office.  I have prescribed a medication that will help alleviate your symptoms and allow you to stay hydrated:  Zofran 4 mg 1 tablet every 8 hours as needed for nausea and vomiting  HOME CARE:  Drink clear liquids.  This is very important! Dehydration (the lack of fluid) can lead to a serious complication.  Start off with 1 tablespoon every 5 minutes for 8 hours.  You may begin eating bland foods after 8 hours without vomiting.  Start with saltine crackers, white bread, rice, mashed potatoes, applesauce.  After 48 hours on a bland diet, you may resume a normal diet.  Try to go to sleep.  Sleep often empties the stomach and relieves the need to vomit.  GET HELP RIGHT AWAY IF:   Your symptoms do not improve or worsen within 2 days after treatment.  You have a fever for over 3 days.  You cannot keep down fluids after trying the medication.  MAKE SURE YOU:   Understand these instructions.  Will watch your condition.  Will get help right away if you are not doing well or get worse.   Thank you for choosing an e-visit. Your e-visit answers were reviewed by a board certified advanced clinical practitioner to complete your personal care plan. Depending upon the condition, your plan could have included both over the counter or prescription medications. Please review your pharmacy choice. Be sure that the pharmacy you have chosen is open so  that you can pick up your prescription now.  If there is a problem you may message your provider in MyChart to have the prescription routed to another pharmacy. Your safety is important to us. If you have drug allergies check your prescription carefully.  For the next 24 hours, you can use MyChart to ask questions about today's visit, request a non-urgent call back, or ask for a work or school excuse from your e-visit provider. You will get an e-mail in the next two days asking about your experience. I hope that your e-visit has been valuable and will speed your recovery.  Greater than 5 but less than 10 minutes spent researching, coordinating, and implementing care for this patient today  

## 2019-07-18 ENCOUNTER — Ambulatory Visit: Payer: 59

## 2019-07-21 ENCOUNTER — Ambulatory Visit (INDEPENDENT_AMBULATORY_CARE_PROVIDER_SITE_OTHER): Payer: 59 | Admitting: Internal Medicine

## 2019-07-21 ENCOUNTER — Encounter: Payer: Self-pay | Admitting: Internal Medicine

## 2019-07-21 ENCOUNTER — Other Ambulatory Visit: Payer: Self-pay

## 2019-07-21 VITALS — BP 122/82 | HR 68 | Temp 97.9°F | Wt 211.0 lb

## 2019-07-21 DIAGNOSIS — R42 Dizziness and giddiness: Secondary | ICD-10-CM | POA: Diagnosis not present

## 2019-07-21 DIAGNOSIS — H538 Other visual disturbances: Secondary | ICD-10-CM | POA: Diagnosis not present

## 2019-07-21 DIAGNOSIS — R8290 Unspecified abnormal findings in urine: Secondary | ICD-10-CM | POA: Diagnosis not present

## 2019-07-21 DIAGNOSIS — R002 Palpitations: Secondary | ICD-10-CM

## 2019-07-21 DIAGNOSIS — R202 Paresthesia of skin: Secondary | ICD-10-CM | POA: Diagnosis not present

## 2019-07-21 DIAGNOSIS — R82998 Other abnormal findings in urine: Secondary | ICD-10-CM

## 2019-07-21 DIAGNOSIS — R200 Anesthesia of skin: Secondary | ICD-10-CM | POA: Diagnosis not present

## 2019-07-21 DIAGNOSIS — N898 Other specified noninflammatory disorders of vagina: Secondary | ICD-10-CM | POA: Diagnosis not present

## 2019-07-21 LAB — POC URINALSYSI DIPSTICK (AUTOMATED)
Bilirubin, UA: NEGATIVE
Blood, UA: NEGATIVE
Glucose, UA: NEGATIVE
Ketones, UA: NEGATIVE
Leukocytes, UA: NEGATIVE
Nitrite, UA: NEGATIVE
Protein, UA: NEGATIVE
Spec Grav, UA: 1.015 (ref 1.010–1.025)
Urobilinogen, UA: 0.2 E.U./dL
pH, UA: 6 (ref 5.0–8.0)

## 2019-07-21 NOTE — Patient Instructions (Signed)
Postural Orthostatic Tachycardia Syndrome Postural orthostatic tachycardia syndrome (POTS) is a group of symptoms that occur when a person stands up after lying down. POTS occurs when less blood than normal flows to the body when you stand up. The reduced blood flow to the body makes the heart beat rapidly. POTS may be associated with another medical condition, or it may occur on its own. What are the causes? The cause of this condition is not known, but many conditions and diseases are associated with it. What increases the risk? This condition is more likely to develop in:  Women 15-50 years old.  Women who are pregnant.  Women who are in their period (menstruating).  People who have certain conditions, such as: ? Infection from a virus. ? Attacks of healthy organs by the body's immunity (autoimmune disease). ? Losing a lot of red blood cells (anemia). ? Losing too much water in the body (dehydration). ? An overactive thyroid (hyperthyroidism).  People who take certain medicines.  People who have had a major injury.  People who have had surgery. What are the signs or symptoms? The most common symptom of this condition is light-headedness when one stands from a lying or sitting position. Other symptoms may include:  Feeling a rapid increase in the heartbeat (tachycardia) within 10 minutes of standing up.  Fainting.  Weakness.  Confusion.  Trembling.  Shortness of breath.  Sweating or flushing.  Headache.  Chest pain.  Breathing that is deeper and faster than normal (hyperventilation).  Nausea.  Anxiety. Symptoms may be worse in the morning, and they may be relieved by lying down. How is this diagnosed? This condition is diagnosed based on:  Your symptoms.  Your medical history.  A physical exam.  Checking your heart rate when you are lying down and after you stand up.  Checking your blood pressure when you go from lying down to standing up.  Blood  tests to measure hormones that change with blood pressure. The blood tests will be done when you are lying down and when you are standing up. You may have other tests to check for conditions or diseases that are associated with POTS. How is this treated? Treatment for this condition depends on how severe your symptoms are and whether you have any conditions or diseases that are associated with POTS. Treatment may involve:  Treating any conditions or diseases that are associated with POTS.  Drinking two glasses of water before getting up from a lying position.  Eating more salt (sodium).  Taking medicine to control blood pressure and heart rate (beta-blocker).  Avoiding certain medicines.  Starting an exercise program under the supervision of a health care provider. Follow these instructions at home: Medicines  Take over-the-counter and prescription medicines only as told by your health care provider.  Let your health care provider know about all prescription or over-the-counter medicines. These include herbs, vitamins, and supplements. You may need to stop or adjust some medicines if they cause this condition.  Talk with your health care provider before starting any new medicines. Eating and drinking   Drink enough fluid to keep your urine pale yellow.  If told by your health care provider, drink two glasses of water before getting up from a lying position.  Follow instructions from your health care provider about how much sodium you should eat.  Avoid heavy meals. Eat several small meals a day instead of a few large meals. General instructions  Do an aerobic exercise for 20 minutes   a day, at least 3 days a week.  Ask your health care provider what kinds of exercise are safe for you.  Do not use any products that contain nicotine or tobacco, such as cigarettes and e-cigarettes. These can interfere with blood flow. If you need help quitting, ask your health care  provider.  Keep all follow-up visits as told by your health care provider. This is important. Contact a health care provider if:  Your symptoms do not improve after treatment.  Your symptoms get worse.  You develop new symptoms. Get help right away if:  You have chest pain.  You have difficulty breathing.  You have fainting episodes. These symptoms may represent a serious problem that is an emergency. Do not wait to see if the symptoms will go away. Get medical help right away. Call your local emergency services (911 in the [place] Do not drive yourself to the hospital. Summary  POTS is a condition that can cause light-headedness, fainting, and palpitations when you go from a sitting or lying position to a standing position. It occurs when less blood than normal flows to the body when you stand up.  Treatment for this condition includes treating any underlying conditions, drinking plenty of water, stopping or changing some medicines, or starting an exercise program.  Get help right away if you have chest pain, difficulty breathing, or fainting episodes. These may represent a serious problem that is an emergency. This information is not intended to replace advice given to you by your health care provider. Make sure you discuss any questions you have with your health care provider. Document Revised: 06/15/2017 Document Reviewed: 06/15/2017 Elsevier Patient Education  2020 Elsevier Inc.  

## 2019-07-21 NOTE — Progress Notes (Signed)
Subjective:    Patient ID: Cassidy Myers, female    DOB: October 26, 1995, 24 y.o.   MRN: [id number]  HPI  Pt presents to the clinic today with c/o fatigue, lightheadedness. This has been going on for a few weeks. She reports she is fatigued all the time, sleeping more than usual. She can get lightheaded with standing, or even while sitting down. When she gets lightheaded, she reports numbness and tingling in her hands and blurred vision. She also feels her heart beating fast/hard. She denies syncopal episodes, reports she sits down before she feels like she passes out. She is drinking plenty of fluids. She has felt like this in the past when she was anemic.  She also reports vaginal discharge and urine odor. She denies urgency, frequency or dysuria. She denies abnormal vaginal bleeding. She denies fever, chills, nausea or low back pain. She has not tried anything OTC for this.  Review of Systems      Past Medical History:  Diagnosis Date  . ADHD   . Allergy   . Anxiety   . Depression   . Ehlers-Danlos syndrome   . WGNFAOZH(086.5)     Current Outpatient Medications  Medication Sig Dispense Refill  . ELDERBERRY PO Take by mouth.    Marland Kitchen FLUoxetine (PROZAC) 10 MG capsule Take 1 capsule (10 mg total) by mouth daily. 30 capsule 1  . ondansetron (ZOFRAN) 4 MG tablet Take 1 tablet (4 mg total) by mouth every 8 (eight) hours as needed for nausea or vomiting. 10 tablet 0  . Prenatal Vit-Fe Fumarate-FA (PRENATAL MULTIVITAMIN) TABS tablet Take 1 tablet by mouth daily at 12 noon.     No current facility-administered medications for this visit.    Allergies  Allergen Reactions  . Latex Itching and Other (See Comments)    Redness.  . Sulfa Antibiotics Nausea And Vomiting  . Shellfish Allergy Nausea Only and Rash    Family History  Problem Relation Age of Onset  . Cancer Mother        Cervical  . Asthma Mother   . Depression Mother   . Depression Father   . Schizophrenia Father   .  Depression Maternal Grandmother   . Depression Maternal Grandfather   . Depression Paternal Grandmother   . Diabetes Paternal Grandmother   . Depression Paternal Grandfather   . Diabetes Paternal Grandfather     Social History   Socioeconomic History  . Marital status: Single    Spouse name: Not on file  . Number of children: Not on file  . Years of education: 36  . Highest education level: Not on file  Occupational History  . Occupation: Consulting civil engineer   Tobacco Use  . Smoking status: Never Smoker  . Smokeless tobacco: Never Used  Substance and Sexual Activity  . Alcohol use: No  . Drug use: No  . Sexual activity: Not Currently  Other Topics Concern  . Not on file  Social History Narrative   Regular exercise-yes   Caffeine Use-yes   Social Determinants of Health   Financial Resource Strain:   . Difficulty of Paying Living Expenses: Not on file  Food Insecurity:   . Worried About Programme researcher, broadcasting/film/video in the Last Year: Not on file  . Ran Out of Food in the Last Year: Not on file  Transportation Needs:   . Lack of Transportation (Medical): Not on file  . Lack of Transportation (Non-Medical): Not on file  Physical Activity:   . Days  of Exercise per Week: Not on file  . Minutes of Exercise per Session: Not on file  Stress:   . Feeling of Stress : Not on file  Social Connections:   . Frequency of Communication with Friends and Family: Not on file  . Frequency of Social Gatherings with Friends and Family: Not on file  . Attends Religious Services: Not on file  . Active Member of Clubs or Organizations: Not on file  . Attends Archivist Meetings: Not on file  . Marital Status: Not on file  Intimate Partner Violence:   . Fear of Current or Ex-Partner: Not on file  . Emotionally Abused: Not on file  . Physically Abused: Not on file  . Sexually Abused: Not on file     Constitutional: Pt reports fatigue. Denies fever, malaise, headache or abrupt weight changes.    HEENT: Pt reports intermittent blurred vision. Denies eye pain, eye redness, ear pain, ringing in the ears, wax buildup, runny nose, nasal congestion, bloody nose, or sore throat. Respiratory: Denies difficulty breathing, shortness of breath, cough or sputum production.   Cardiovascular: Pt reports palpitations, swelling in the feet. Denies chest pain, chest tightness, or swelling in the hands.  Gastrointestinal: Denies abdominal pain, bloating, constipation, diarrhea or blood in the stool.  GU: Pt reports vaginal discharge, urine odor. Denies urgency, frequency, pain with urination, burning sensation, blood in urine. Musculoskeletal: Denies decrease in range of motion, difficulty with gait, muscle pain or joint pain and swelling.  Skin: Denies redness, rashes, lesions or ulcercations.  Neurological: Pt reports intermittent lighthdedness, paresthesia of BUE. Denies dizziness, difficulty with memory, difficulty with speech or problems with balance and coordination.  Psych: Denies anxiety, depression, SI/HI.  No other specific complaints in a complete review of systems (except as listed in HPI above).  Objective:   Physical Exam  BP 122/82   Pulse 68   Temp 97.9 F (36.6 C) (Temporal)   Wt 211 lb (95.7 kg)   LMP 07/09/2019   SpO2 97%   BMI 35.66 kg/m   Wt Readings from Last 3 Encounters:  01/24/19 209 lb (94.8 kg)  06/22/18 190 lb (86.2 kg)  11/12/17 206 lb (93.4 kg)    General: Appears her stated age, obese, in NAD. Cardiovascular: Normal rate and rhythm.  Pulmonary/Chest: Normal effort. No respiratory distress.  Abdomen: Normal bowel sounds.  Musculoskeletal:  No difficulty with gait.  Neurological: Alert and oriented. Coordination normal.    BMET    Component Value Date/Time   NA 138 01/24/2019 1458   K 4.3 01/24/2019 1458   CL 104 01/24/2019 1458   CO2 26 01/24/2019 1458   GLUCOSE 87 01/24/2019 1458   BUN 8 01/24/2019 1458   CREATININE 0.66 01/24/2019 1458    CALCIUM 9.2 01/24/2019 1458   GFRNONAA >60 11/12/2017 2306   GFRAA >60 11/12/2017 2306    Lipid Panel     Component Value Date/Time   CHOL 144 01/24/2019 1458   TRIG 128.0 01/24/2019 1458   HDL 30.60 (L) 01/24/2019 1458   CHOLHDL 5 01/24/2019 1458   VLDL 25.6 01/24/2019 1458   LDLCALC 88 01/24/2019 1458    CBC    Component Value Date/Time   WBC 8.5 01/24/2019 1458   RBC 4.30 01/24/2019 1458   HGB 11.7 (L) 01/24/2019 1458   HCT 35.5 (L) 01/24/2019 1458   PLT 333.0 01/24/2019 1458   MCV 82.6 01/24/2019 1458   MCH 26.8 11/12/2017 2306   MCHC 33.0 01/24/2019 1458  RDW 13.7 01/24/2019 1458   LYMPHSABS 2.7 01/24/2019 1458   MONOABS 0.9 01/24/2019 1458   EOSABS 0.3 01/24/2019 1458   BASOSABS 0.1 01/24/2019 1458    Hgb A1C No results found for: HGBA1C         Assessment & Plan:   Fatigue, Lightheadedness, Blurred Vision, Palpitations, Paresthesia of BUE:  Will check CBC, CMET, TSH today Orthostatics positive Consider referral to cardiology pending labs to r/o possible POTS  Urine Odor, Vaginal Discharge:  Urinalysis and Culture Wet prep sent off for further evaluation  Will follow up after labs are back, return precautions discussed Nicki Reaper, NP This visit occurred during the SARS-CoV-2 public health emergency.  Safety protocols were in place, including screening questions prior to the visit, additional usage of staff PPE, and extensive cleaning of exam room while observing appropriate contact time as indicated for disinfecting solutions.

## 2019-07-21 NOTE — Addendum Note (Signed)
Addended by: Roena Malady on: 07/21/2019 04:43 PM   Modules accepted: Orders

## 2019-07-21 NOTE — Addendum Note (Signed)
Addended by: Aquilla Solian on: 07/21/2019 04:53 PM   Modules accepted: Orders

## 2019-07-21 NOTE — Addendum Note (Signed)
Addended by: Aquilla Solian on: 07/21/2019 04:52 PM   Modules accepted: Orders

## 2019-07-22 ENCOUNTER — Encounter: Payer: Self-pay | Admitting: Internal Medicine

## 2019-07-22 DIAGNOSIS — I951 Orthostatic hypotension: Secondary | ICD-10-CM

## 2019-07-22 LAB — CBC
HCT: 34.2 % — ABNORMAL LOW (ref 35.0–45.0)
Hemoglobin: 11.2 g/dL — ABNORMAL LOW (ref 11.7–15.5)
MCH: 27.3 pg (ref 27.0–33.0)
MCHC: 32.7 g/dL (ref 32.0–36.0)
MCV: 83.2 fL (ref 80.0–100.0)
MPV: 11.3 fL (ref 7.5–12.5)
Platelets: 328 10*3/uL (ref 140–400)
RBC: 4.11 10*6/uL (ref 3.80–5.10)
RDW: 12.8 % (ref 11.0–15.0)
WBC: 8.5 10*3/uL (ref 3.8–10.8)

## 2019-07-22 LAB — COMPREHENSIVE METABOLIC PANEL
AG Ratio: 1.5 (calc) (ref 1.0–2.5)
ALT: 9 U/L (ref 6–29)
AST: 15 U/L (ref 10–30)
Albumin: 4.1 g/dL (ref 3.6–5.1)
Alkaline phosphatase (APISO): 87 U/L (ref 31–125)
BUN: 7 mg/dL (ref 7–25)
CO2: 23 mmol/L (ref 20–32)
Calcium: 9.4 mg/dL (ref 8.6–10.2)
Chloride: 105 mmol/L (ref 98–110)
Creat: 0.67 mg/dL (ref 0.50–1.10)
Globulin: 2.8 g/dL (calc) (ref 1.9–3.7)
Glucose, Bld: 92 mg/dL (ref 65–99)
Potassium: 4 mmol/L (ref 3.5–5.3)
Sodium: 138 mmol/L (ref 135–146)
Total Bilirubin: 0.6 mg/dL (ref 0.2–1.2)
Total Protein: 6.9 g/dL (ref 6.1–8.1)

## 2019-07-22 LAB — TSH: TSH: 1.14 mIU/L

## 2019-07-23 LAB — WET PREP BY MOLECULAR PROBE
Candida species: NOT DETECTED
MICRO NUMBER:: 10219928
SPECIMEN QUALITY:: ADEQUATE
Trichomonas vaginosis: NOT DETECTED

## 2019-07-23 LAB — URINE CULTURE
MICRO NUMBER:: 10219929
SPECIMEN QUALITY:: ADEQUATE

## 2019-07-24 MED ORDER — METRONIDAZOLE 0.75 % VA GEL: 1.0000 | Freq: Two times a day (BID) | VAGINAL | 0 refills | Status: DC

## 2019-07-24 NOTE — Addendum Note (Signed)
Addended by: Lorre Munroe on: 07/24/2019 11:28 AM   Modules accepted: Orders

## 2019-08-03 ENCOUNTER — Other Ambulatory Visit: Payer: Self-pay | Admitting: Internal Medicine

## 2019-08-03 DIAGNOSIS — Q796 Ehlers-Danlos syndrome, unspecified: Secondary | ICD-10-CM

## 2019-08-04 ENCOUNTER — Other Ambulatory Visit: Payer: Self-pay | Admitting: Internal Medicine

## 2019-08-04 DIAGNOSIS — Q796 Ehlers-Danlos syndrome, unspecified: Secondary | ICD-10-CM

## 2019-08-08 ENCOUNTER — Encounter: Payer: Self-pay | Admitting: Cardiovascular Disease

## 2019-08-08 ENCOUNTER — Ambulatory Visit (INDEPENDENT_AMBULATORY_CARE_PROVIDER_SITE_OTHER): Payer: 59 | Admitting: Cardiovascular Disease

## 2019-08-08 ENCOUNTER — Other Ambulatory Visit: Payer: Self-pay

## 2019-08-08 VITALS — BP 116/75 | HR 61 | Ht 64.5 in | Wt 212.0 lb

## 2019-08-08 DIAGNOSIS — R002 Palpitations: Secondary | ICD-10-CM | POA: Diagnosis not present

## 2019-08-08 DIAGNOSIS — I951 Orthostatic hypotension: Secondary | ICD-10-CM

## 2019-08-08 MED ORDER — MIDODRINE HCL 10 MG PO TABS: 10.0000 mg | ORAL_TABLET | Freq: Three times a day (TID) | ORAL | 3 refills | Status: DC | PRN

## 2019-08-08 MED ORDER — FUROSEMIDE 20 MG PO TABS: 20.0000 mg | ORAL_TABLET | Freq: Every day | ORAL | 0 refills | Status: DC | PRN

## 2019-08-08 NOTE — Patient Instructions (Addendum)
Medication Instructions:  Midodrine 5-10 three times a day as needed for blood pressure support We will send in 10 mg TID PRN  Lasix 20 mg daily as needed, 30 pills   If you need a refill on your cardiac medications before your next appointment, please call your pharmacy.    Lab work: No new labs needed   If you have labs (blood work) drawn today and your tests are completely normal, you will receive your results only by: Marland Kitchen MyChart Message (if you have MyChart) OR . A paper copy in the mail If you have any lab test that is abnormal or we need to change your treatment, we will call you to review the results.  Testing/Procedures: No new testing needed   Follow-Up: At [facility], you and your health needs are our priority.  As part of our continuing mission to provide you with exceptional heart care, we have created designated Provider Care Teams.  These Care Teams include your primary Cardiologist (physician) and Advanced Practice Providers (APPs -  Physician Assistants and Nurse Practitioners) who all work together to provide you with the care you need, when you need it.  . You will need a follow up appointment in 12 months   . Providers on your designated Care Team:   . Nicolasa Ducking, NP . Eula Listen, PA-C . Marisue Ivan, PA-C  Any Other Special Instructions Will Be Listed Below (If Applicable).  For educational health videos Log in to : [email or web address] Or : [email or web address], password : [email or web address]

## 2019-08-08 NOTE — Progress Notes (Signed)
Cardiology Office Note  Date:  08/08/2019   ID:  Cassidy Myers, DOB [birth year], MRN [id number]  PCP:  Jearld Fenton, NP   Chief Complaint  Patient presents with  . New Patient (Initial Visit)    Ortho Hypotension; Meds verbally reviewed with patient.    HPI:  Cassidy Myers is a 24 year old woman with past medical history of Postural orthostasis Referred by [facility] for consultation of her  fatigue and lightheadedness, Ehlers-Danlos syndrome  On discussion today, she reports having fatigue all the time, sleeping more than usual.  lightheaded with standing,  numbness and tingling in her hands and blurred vision.  feels her heart beating fast/hard.  she sits down before she feels like she passes out. drinking plenty of fluids.  She has felt like this in the past when she was anemic.  Reports a dx with POTS [employer] 2017 pregnany 2018, BP was ok, deliered 2019 c-section, transfusion x 2  Dx with EDS, ehler danlos syndrome Weight stable Hydrated,   Lab work reviewed HCT 34.2, HBG 11.2  Wears compression hose  EKG personally reviewed by myself on todays visit Shows NSR rate 61 bpm no significant ST-T wave changes  Orthostatics in the office today Supine pulse 64 blood pressure 117/77 Sitting pulse 79 blood pressure 113/73 Standing pulse 83 blood pressure 110/76 Standing 3 minutes pulse 88 blood pressure 109/76   PMH:   has a past medical history of ADHD, Allergy, Anxiety, Depression, Ehlers-Danlos syndrome, and Headache(784.0).  PSH:    Past Surgical History:  Procedure Laterality Date  . CESAREAN SECTION N/A 11/08/2017   Procedure: CESAREAN SECTION;  Surgeon: Crawford Givens, MD;  Location: [facility];  Service: Obstetrics;  Laterality: N/A;  . NO PAST SURGERIES    . WISDOM TOOTH EXTRACTION      Current Outpatient Medications  Medication Sig Dispense Refill  . ELDERBERRY PO Take by mouth daily.     Marland Kitchen FLUoxetine (PROZAC) 10 MG capsule Take  1 capsule (10 mg total) by mouth daily. (Patient not taking: Reported on 07/21/2019) 30 capsule 1  . furosemide (LASIX) 20 MG tablet Take 1 tablet (20 mg total) by mouth daily as needed (As needed for swelling). 30 tablet 0  . metroNIDAZOLE (METROGEL VAGINAL) 0.75 % vaginal gel Place 1 Applicatorful vaginally 2 (two) times daily. 70 g 0  . midodrine (PROAMATINE) 10 MG tablet Take 1 tablet (10 mg total) by mouth 3 (three) times daily as needed (As needed for blood pressure support.). 270 tablet 3  . ondansetron (ZOFRAN) 4 MG tablet Take 1 tablet (4 mg total) by mouth every 8 (eight) hours as needed for nausea or vomiting. (Patient not taking: Reported on 07/21/2019) 10 tablet 0  . Prenatal Vit-Fe Fumarate-FA (PRENATAL MULTIVITAMIN) TABS tablet Take 1 tablet by mouth daily at 12 noon.     No current facility-administered medications for this visit.     Allergies:   Latex, Sulfa antibiotics, and Shellfish allergy   Social History:  The patient  reports that she has never smoked. She has never used smokeless tobacco. She reports current alcohol use. She reports that she does not use drugs.   Family History:   family history includes Asthma in her mother; Cancer in her mother; Clotting disorder in her father; Depression in her father, maternal grandfather, maternal grandmother, mother, paternal grandfather, and paternal grandmother; Diabetes in her paternal grandfather and paternal grandmother; Schizophrenia in her father.    Review of Systems: Review of Systems  Constitutional:  Negative.   HENT: Negative.   Respiratory: Negative.   Cardiovascular: Negative.   Gastrointestinal: Negative.   Musculoskeletal: Negative.   Neurological: Positive for dizziness.  Psychiatric/Behavioral: Negative.   All other systems reviewed and are negative.    PHYSICAL EXAM: VS:  BP 116/75 (BP Location: Right Arm, Patient Position: Sitting, Cuff Size: Normal)   Pulse 61   Ht 5' 4.5" (1.638 m)   Wt 212 lb (96.2  kg)   LMP 07/09/2019   SpO2 99%   BMI 35.83 kg/m  , BMI Body mass index is 35.83 kg/m. GEN: Well nourished, well developed, in no acute distress HEENT: normal Neck: no JVD, carotid bruits, or masses Cardiac: RRR; no murmurs, rubs, or gallops,no edema  Respiratory:  clear to auscultation bilaterally, normal work of breathing GI: soft, nontender, nondistended, + BS MS: no deformity or atrophy Skin: warm and dry, no rash Neuro:  Strength and sensation are intact Psych: euthymic mood, full affect   Recent Labs: 07/21/2019: ALT 9; BUN 7; Creat 0.67; Hemoglobin 11.2; Platelets 328; Potassium 4.0; Sodium 138; TSH 1.14    Lipid Panel Lab Results  Component Value Date   CHOL 144 01/24/2019   HDL 30.60 (L) 01/24/2019   LDLCALC 88 01/24/2019   TRIG 128.0 01/24/2019      Wt Readings from Last 3 Encounters:  08/08/19 212 lb (96.2 kg)  07/21/19 211 lb (95.7 kg)  01/24/19 209 lb (94.8 kg)     ASSESSMENT AND PLAN:  Problem List Items Addressed This Visit    None    Visit Diagnoses    Orthostatic hypotension    -  Primary   Relevant Medications   midodrine (PROAMATINE) 10 MG tablet   furosemide (LASIX) 20 MG tablet   Palpitations       Relevant Orders   EKG 12-Lead     Orthostatic hypotension She reports having prior diagnosis of POTS Previously seen by Dr. Graciela Husbands 2016 and 18 Well aware of treatment of dysautonomia, Stressed importance of salt water repletion, exercise, aware of the triggers As she continues to have symptomatic days, recommend she try midodrine sparingly 5 up to 10 mg for symptomatic hypotension Recommended compression hose, abdominal binder  Leg edema Minimal on today's visit but reports having days when she is very symptomatic Previously tried on chlorthalidone/thiazide diuretic but this did not seem to help 12.5 mg dose Recommend she take Lasix sparingly for days when her ankle feet legs feel tight -Recommended compression hose  Ehlers-Danlos  syndrome Prior notes reviewed, indicating joint hypermobility Previously seen at [facility]  Disposition:   F/U as needed   Total encounter time more than 60 minutes  Greater than 50% was spent in counseling and coordination of care with the patient  Patient was seen in consultation for Nicki Reaper will be referred back to her office for ongoing care of the issues detailed above   Signed, Dossie Arbour, M.D., Ph.D. [facility] [place], [place] [phone]

## 2019-11-16 DIAGNOSIS — Z419 Encounter for procedure for purposes other than remedying health state, unspecified: Secondary | ICD-10-CM | POA: Diagnosis not present

## 2019-11-22 ENCOUNTER — Encounter: Payer: Self-pay | Admitting: Internal Medicine

## 2019-11-24 ENCOUNTER — Telehealth: Payer: Self-pay | Admitting: Internal Medicine

## 2019-11-24 ENCOUNTER — Ambulatory Visit (INDEPENDENT_AMBULATORY_CARE_PROVIDER_SITE_OTHER): Payer: 59 | Admitting: Family Medicine

## 2019-11-24 ENCOUNTER — Ambulatory Visit: Payer: 59 | Admitting: Internal Medicine

## 2019-11-24 ENCOUNTER — Other Ambulatory Visit: Payer: Self-pay

## 2019-11-24 ENCOUNTER — Encounter: Payer: Self-pay | Admitting: Family Medicine

## 2019-11-24 VITALS — BP 118/70 | HR 79 | Temp 98.3°F | Wt 197.0 lb

## 2019-11-24 DIAGNOSIS — Z3490 Encounter for supervision of normal pregnancy, unspecified, unspecified trimester: Secondary | ICD-10-CM | POA: Diagnosis not present

## 2019-11-24 DIAGNOSIS — N3000 Acute cystitis without hematuria: Secondary | ICD-10-CM

## 2019-11-24 DIAGNOSIS — R300 Dysuria: Secondary | ICD-10-CM | POA: Diagnosis not present

## 2019-11-24 HISTORY — DX: Acute cystitis without hematuria: N30.00

## 2019-11-24 LAB — POC URINALSYSI DIPSTICK (AUTOMATED)
Bilirubin, UA: NEGATIVE
Blood, UA: NEGATIVE
Glucose, UA: NEGATIVE
Ketones, UA: NEGATIVE
Nitrite, UA: NEGATIVE
Protein, UA: POSITIVE — AB
Spec Grav, UA: 1.02 (ref 1.010–1.025)
Urobilinogen, UA: 0.2 E.U./dL
pH, UA: 6.5 (ref 5.0–8.0)

## 2019-11-24 LAB — POCT UA - MICROSCOPIC ONLY

## 2019-11-24 LAB — POCT URINE PREGNANCY: Preg Test, Ur: POSITIVE — AB

## 2019-11-24 MED ORDER — CEPHALEXIN 500 MG PO CAPS: 500.0000 mg | ORAL_CAPSULE | Freq: Three times a day (TID) | ORAL | 0 refills | Status: DC

## 2019-11-24 NOTE — Patient Instructions (Addendum)
Start prenatal vitamin.  We will call with urine culture results.  Complete a course of  Keflex x 7 days.  Call OB GYN to set up appt for new pregnancy!

## 2019-11-24 NOTE — Telephone Encounter (Signed)
Spoke with patient and triaged her symptoms:  Nicki Reaper, NP is out of the office today.  We do have some openings with Dr. Ermalene Searing if appropriate.  Patient has ongoing chronic nasal drainage/congestion intermittent x several weeks.  This is not an acute symptom and she denies any known exposure to COVID.  Denies any other symptoms including fever, sore throat, sneezing, cough, chest congestion, loss of taste/smell SOB or dyspnea.   Patients main complaint is possible UTI.  1.5 weeks patient has been complaining of pain, burning with urination, lower abdominal/pelvic cramping, loss of bladder control. Denies any hematuria. Has been using AZO and increased fluid intake without much relief.  She stopped the AZO a few days ago.  She also would like to get a pregnancy test done today as well.   Spoke with Dr. Ermalene Searing and Zettie Cooley, CMA to review if they were comfortable with brining patient in.  Both in agreement and patient will come in at 320 appointment for evaluation.   Patient thanks [place] for the care and support.

## 2019-11-24 NOTE — Assessment & Plan Note (Signed)
Treat with cephalosporin.. send urine for culture , keep up with fluids.

## 2019-11-24 NOTE — Progress Notes (Signed)
Chief Complaint  Patient presents with  . Urinary Urgency    x 1 week  . Urinary Frequency    x 1 week  . Dysuria    x 1 week     History of Present Illness: Dysuria  This is a new problem. The current episode started 1 to 4 weeks ago (1 week). The problem occurs every urination. The problem has been waxing and waning. The quality of the pain is described as burning. The pain is at a severity of 8/10. The pain is severe. There has been no fever. She is sexually active. There is no history of pyelonephritis. Associated symptoms include frequency, nausea and urgency. Pertinent negatives include no discharge, flank pain, hematuria, hesitancy, possible pregnancy or vomiting. Associated symptoms comments: Regular menses in early JUNe 6,, but has had positive preg test.. She has tried increased fluids ( AZO) for the symptoms. frequnet UTI, last UTI 2-3 years ago    Urinalysis    Component Value Date/Time   COLORURINE YELLOW 11/12/2017 2322   APPEARANCEUR HAZY (A) 11/12/2017 2322   LABSPEC 1.011 11/12/2017 2322   PHURINE 7.0 11/12/2017 2322   GLUCOSEU NEGATIVE 11/12/2017 2322   GLUCOSEU NEGATIVE 12/20/2012 1440   HGBUR LARGE (A) 11/12/2017 2322   BILIRUBINUR negative 11/24/2019 1549   KETONESUR NEGATIVE 11/12/2017 2322   PROTEINUR Positive (A) 11/24/2019 1549   PROTEINUR NEGATIVE 11/12/2017 2322   UROBILINOGEN 0.2 11/24/2019 1549   UROBILINOGEN 0.2 08/12/2014 1705   NITRITE negative 11/24/2019 1549   NITRITE NEGATIVE 11/12/2017 2322   LEUKOCYTESUR Small (1+) (A) 11/24/2019 1549       This visit occurred during the SARS-CoV-2 public health emergency.  Safety protocols were in place, including screening questions prior to the visit, additional usage of staff PPE, and extensive cleaning of exam room while observing appropriate contact time as indicated for disinfecting solutions.   COVID 19 screen:  No recent travel or known exposure to COVID19 The patient denies respiratory  symptoms of COVID 19 at this time. The importance of social distancing was discussed today.     Review of Systems  Gastrointestinal: Positive for nausea. Negative for vomiting.  Genitourinary: Positive for dysuria, frequency and urgency. Negative for flank pain, hematuria and hesitancy.      Past Medical History:  Diagnosis Date  . ADHD   . Allergy   . Anxiety   . Depression   . Ehlers-Danlos syndrome   . Headache(784.0)     reports that she has never smoked. She has never used smokeless tobacco. She reports current alcohol use. She reports that she does not use drugs.   Current Outpatient Medications:  .  ELDERBERRY PO, Take by mouth daily. , Disp: , Rfl:  .  midodrine (PROAMATINE) 10 MG tablet, Take 1 tablet (10 mg total) by mouth 3 (three) times daily as needed (As needed for blood pressure support.)., Disp: 270 tablet, Rfl: 3 .  furosemide (LASIX) 20 MG tablet, Take 1 tablet (20 mg total) by mouth daily as needed (As needed for swelling)., Disp: 30 tablet, Rfl: 0   Observations/Objective: Blood pressure 118/70, pulse 79, temperature 98.3 F (36.8 C), temperature source Temporal, weight 197 lb (89.4 kg), SpO2 97 %, currently breastfeeding.  Physical Exam Constitutional:      General: She is not in acute distress.    Appearance: Normal appearance. She is well-developed. She is not ill-appearing or toxic-appearing.  HENT:     Head: Normocephalic.     Right Ear: Hearing, tympanic  membrane, ear canal and external ear normal. Tympanic membrane is not erythematous, retracted or bulging.     Left Ear: Hearing, tympanic membrane, ear canal and external ear normal. Tympanic membrane is not erythematous, retracted or bulging.     Nose: No mucosal edema or rhinorrhea.     Right Sinus: No maxillary sinus tenderness or frontal sinus tenderness.     Left Sinus: No maxillary sinus tenderness or frontal sinus tenderness.     Mouth/Throat:     Pharynx: Uvula midline.  Eyes:     General:  Lids are normal. Lids are everted, no foreign bodies appreciated.     Conjunctiva/sclera: Conjunctivae normal.     Pupils: Pupils are equal, round, and reactive to light.  Neck:     Thyroid: No thyroid mass or thyromegaly.     Vascular: No carotid bruit.     Trachea: Trachea normal.  Cardiovascular:     Rate and Rhythm: Normal rate and regular rhythm.     Pulses: Normal pulses.     Heart sounds: Normal heart sounds, S1 normal and S2 normal. No murmur heard.  No friction rub. No gallop.   Pulmonary:     Effort: Pulmonary effort is normal. No tachypnea or respiratory distress.     Breath sounds: Normal breath sounds. No decreased breath sounds, wheezing, rhonchi or rales.  Abdominal:     General: Bowel sounds are normal.     Palpations: Abdomen is soft.     Tenderness: There is no abdominal tenderness.  Musculoskeletal:     Cervical back: Normal range of motion and neck supple.  Skin:    General: Skin is warm and dry.     Findings: No rash.  Neurological:     Mental Status: She is alert.  Psychiatric:        Mood and Affect: Mood is not anxious or depressed.        Speech: Speech normal.        Behavior: Behavior normal. Behavior is cooperative.        Thought Content: Thought content normal.        Judgment: Judgment normal.      Assessment and Plan    Acute cystitis without hematuria Treat with cephalosporin.. send urine for culture , keep up with fluids.  Pregnancy Reviewed healthy early pregnancy care.  Prenatal vitamin and contact OB encouraged.    Kerby Nora, MD  ce

## 2019-11-24 NOTE — Telephone Encounter (Signed)
Patient was called to r/s appt for today. She wanted to see another provider.  While doing the covid screening questions patient stated she does have congestion. Advised patient that due to policies we were not able to bring her in.  She stated she needed this test done today so she can go back to work.  Again let patient know policies and symptoms that could not come in right now.  Patient became upset and started crying and screaming at me about why she had congestion. I spoke with Angelica Chessman to see what we could do. She advised that with her symptoms she couldn't come in and also recommended UC if test needed today. Angelica Chessman stated we could have a nurse call her to further discuss if needed.   When I picked up the phone patient to speak with patient she stated "everyones suppose to be vaccinated and I cant even come in, F Covid" and hung up   [facility]

## 2019-11-24 NOTE — Assessment & Plan Note (Signed)
Reviewed healthy early pregnancy care.  Prenatal vitamin and contact OB encouraged.

## 2019-11-24 NOTE — Telephone Encounter (Signed)
Patient's mom called back.  Mom was very upset because the patient could not come in with congestion She stated it was just allergies. And we are denying her care.  Tried to explain policies with mom about symptoms that could not come in.  Mom began yelling and stated all UC are cone so how can she go in.    I Spoke with Angelica Chessman and let her take the call from there.

## 2019-11-26 LAB — URINE CULTURE
MICRO NUMBER:: 10686407
SPECIMEN QUALITY:: ADEQUATE

## 2019-12-07 ENCOUNTER — Other Ambulatory Visit: Payer: Self-pay

## 2019-12-07 ENCOUNTER — Ambulatory Visit (INDEPENDENT_AMBULATORY_CARE_PROVIDER_SITE_OTHER): Payer: 59 | Admitting: Family Medicine

## 2019-12-07 ENCOUNTER — Encounter: Payer: Self-pay | Admitting: Family Medicine

## 2019-12-07 VITALS — BP 100/62 | HR 75 | Temp 97.7°F | Wt 197.0 lb

## 2019-12-07 DIAGNOSIS — M545 Low back pain, unspecified: Secondary | ICD-10-CM

## 2019-12-07 DIAGNOSIS — Z3A01 Less than 8 weeks gestation of pregnancy: Secondary | ICD-10-CM

## 2019-12-07 DIAGNOSIS — Q796 Ehlers-Danlos syndrome, unspecified: Secondary | ICD-10-CM | POA: Diagnosis not present

## 2019-12-07 MED ORDER — CYCLOBENZAPRINE HCL 5 MG PO TABS: 5.0000 mg | ORAL_TABLET | Freq: Three times a day (TID) | ORAL | 1 refills | Status: DC | PRN

## 2019-12-07 NOTE — Patient Instructions (Addendum)
Tylenol is the safest Can take 1000 mg every 8 hours Heat is safe stretches  Check with OB on the following Ibuprofen 600-800 mg every 8 hours  Flexeril -- likely at night

## 2019-12-07 NOTE — Progress Notes (Signed)
Subjective:     Cassidy Myers is a 24 y.o. female presenting for Muscle Pain (left lumbar x 2 days )     Back Pain This is a new problem. The current episode started in the past 7 days. The problem occurs constantly. The problem has been gradually worsening since onset. The pain is present in the lumbar spine. The pain radiates to the left thigh. The pain is at a severity of 7/10. The pain is worse during the day. The symptoms are aggravated by bending and sitting (walking). Pertinent negatives include no bladder incontinence, bowel incontinence, headaches, numbness, tingling or weakness (is noticing some shaking). Risk factors: hx of fall on back. She has tried heat, home exercises and analgesics for the symptoms. The treatment provided no relief.   Endorses some lightheadedness   Review of Systems  Gastrointestinal: Negative for bowel incontinence.  Genitourinary: Negative for bladder incontinence.  Musculoskeletal: Positive for back pain.  Neurological: Negative for tingling, weakness (is noticing some shaking), numbness and headaches.     Social History   Tobacco Use  Smoking Status Never Smoker  Smokeless Tobacco Never Used        Objective:    BP Readings from Last 3 Encounters:  12/07/19 (!) 100/62  11/24/19 118/70  08/08/19 116/75   Wt Readings from Last 3 Encounters:  12/07/19 197 lb (89.4 kg)  11/24/19 197 lb (89.4 kg)  08/08/19 212 lb (96.2 kg)    BP (!) 100/62   Pulse 75   Temp 97.7 F (36.5 C) (Temporal)   Wt 197 lb (89.4 kg)   LMP 07/09/2019   SpO2 98%   BMI 33.29 kg/m    Physical Exam Constitutional:      General: She is not in acute distress.    Appearance: She is well-developed. She is not diaphoretic.  HENT:     Right Ear: External ear normal.     Left Ear: External ear normal.  Eyes:     Conjunctiva/sclera: Conjunctivae normal.  Cardiovascular:     Rate and Rhythm: Normal rate.  Pulmonary:     Effort: Pulmonary effort is normal.   Musculoskeletal:     Cervical back: Neck supple.     Comments: Back: Inspection: unable to stand up straight, difficulty getting out of a chair. No step offs.  Palpation: TTP along the lumbar spine and left paraspinous muscles ROM: does not perform flexion/extension due to pain. Is able to rotate/lateral flexion w/ worsening symptoms Strength: Normal LE Straight leg: negative  Skin:    General: Skin is warm and dry.     Capillary Refill: Capillary refill takes less than 2 seconds.  Neurological:     Mental Status: She is alert. Mental status is at baseline.     Gait: Gait normal.     Deep Tendon Reflexes: Reflexes normal.  Psychiatric:        Mood and Affect: Mood normal.        Behavior: Behavior normal.           Assessment & Plan:   Problem List Items Addressed This Visit      Musculoskeletal and Integument   Ehlers-Danlos syndrome   Relevant Orders   Ambulatory referral to Physical Therapy     Other   Pregnancy   Relevant Medications   cyclobenzaprine (FLEXERIL) 5 MG tablet   Other Relevant Orders   Ambulatory referral to Physical Therapy   Acute left-sided low back pain without sciatica - Primary  Patient currently in 1st trimester of pregnancy. Reviewed that ibuprofen and flexeril are both category B at this stage in pregnancy, but out of precaution advised discussing with OB providers before she takes either. OK for tylenol. Also given hx of back pain and current pregnancy discussed pelvic floor PT referral given back pain - to help with symptoms and avoid future symptoms as she progresses in pregnancy.       Relevant Medications   acetaminophen (TYLENOL) 500 MG tablet   cyclobenzaprine (FLEXERIL) 5 MG tablet   Other Relevant Orders   Ambulatory referral to Physical Therapy       Return in about 6 weeks (around 01/18/2020).  Lynnda Child, MD  This visit occurred during the SARS-CoV-2 public health emergency.  Safety protocols were in place,  including screening questions prior to the visit, additional usage of staff PPE, and extensive cleaning of exam room while observing appropriate contact time as indicated for disinfecting solutions.

## 2019-12-07 NOTE — Assessment & Plan Note (Signed)
Patient currently in 1st trimester of pregnancy. Reviewed that ibuprofen and flexeril are both category B at this stage in pregnancy, but out of precaution advised discussing with OB providers before she takes either. OK for tylenol. Also given hx of back pain and current pregnancy discussed pelvic floor PT referral given back pain - to help with symptoms and avoid future symptoms as she progresses in pregnancy.

## 2019-12-17 DIAGNOSIS — Z419 Encounter for procedure for purposes other than remedying health state, unspecified: Secondary | ICD-10-CM | POA: Diagnosis not present

## 2019-12-18 ENCOUNTER — Encounter: Payer: Self-pay | Admitting: Physical Therapy

## 2019-12-18 ENCOUNTER — Ambulatory Visit: Payer: 59 | Attending: Family Medicine | Admitting: Physical Therapy

## 2019-12-18 ENCOUNTER — Other Ambulatory Visit: Payer: Self-pay

## 2019-12-18 DIAGNOSIS — Q796 Ehlers-Danlos syndrome, unspecified: Secondary | ICD-10-CM | POA: Insufficient documentation

## 2019-12-18 DIAGNOSIS — R278 Other lack of coordination: Secondary | ICD-10-CM | POA: Insufficient documentation

## 2019-12-18 DIAGNOSIS — M533 Sacrococcygeal disorders, not elsewhere classified: Secondary | ICD-10-CM | POA: Insufficient documentation

## 2019-12-18 NOTE — Patient Instructions (Signed)
Open book ( handout)   ___  Proper body mechanics with getting out of a chair to decrease strain  on back &pelvic floor   Avoid holding your breath when Getting out of the chair:  Scoot to front part of chair chair Heels behind feet, feet are hip width apart, nose over toes  Inhale like you are smelling roses Exhale to stand     __   Avoid straining pelvic floor, abdominal muscles , spine  Use log rolling technique instead of getting out of bed with your neck or the sit-up     Log rolling into and out of bed   Log rolling into and out of bed If getting out of bed on R side, Bent knees, scoot hips/ shoulder to L  Raise R arm completely overhead, rolling onto armpit  Then lower bent knees to bed to get into complete side lying position  Then drop legs off bed, and push up onto R elbow/forearm, and use L hand to push onto the bed   __  Functional positions Semi tandem stand , 45 deg turn to changing table, car seat , standing at work at the counter To engage legs, pelvis    NO standing with hips to the side when holding baby  __  Dig elbows and feet into bed to roll without lifting head , knees together hip width apart

## 2019-12-19 NOTE — Therapy (Signed)
[place] [facility] MAIN [facility] SERVICES [address] [place], [place], [address] Phone: [phone]   Fax:  [phone]  Physical Therapy Evaluation  Patient Details  Name: Cassidy Myers MRN: [id number] Date of Birth: [birth year] Referring Provider (PT): Gweneth Dimitri MD   Encounter Date: 12/18/2019   PT End of Session - 12/18/19 1310    Visit Number 1    Number of Visits 10    Date for PT Re-Evaluation 02/26/20    PT Start Time 1304    PT Stop Time 1354    PT Time Calculation (min) 50 min           Past Medical History:  Diagnosis Date  . Acute cystitis without hematuria 11/24/2019  . ADHD   . Allergy   . Anxiety   . Depression   . Ehlers-Danlos syndrome   . PYPPJKDT(267.1)     Past Surgical History:  Procedure Laterality Date  . CESAREAN SECTION N/A 11/08/2017   Procedure: CESAREAN SECTION;  Surgeon: Jaymes Graff, MD;  Location: [facility];  Service: Obstetrics;  Laterality: N/A;  . NO PAST SURGERIES    . WISDOM TOOTH EXTRACTION      There were no vitals filed for this visit.    Subjective Assessment - 12/18/19 1310    Subjective 1) Pt is [redacted] weeks pregnant with her 2nd child. Two weeks ago, pt was picking up her 23 year old dtr and experienced a shooting pain down from L low back to back of her foot and also upward to her L shoulder blade. Pt required assistance to walk and dress. The pain decreased after she took pain meds from her PCP.  It has been a week since she has been walking normal. The pain at the time was 10/10 and now, 3/10. The pain still radiates if she moves too fast if she is not ready. Pt is still not able to pick up her toddler who weighs 32 lbs.  Pt is taking Tylenol to manage with the pain. Pt stopped working at 6 weeks of pregnancy. Pt used to be a server. Pt is at home caring for 23 year old and doing hosuehold chores. Pt walked for 30 min with a stroller and dtr and it caused soreness in her low back pain.     2) After this back injury, pt has noticed increased leakage if she do not get to the toilet in time.Leakage also occurs with sneezing and laughing, coughing.     Pertinent History EDS impacted her first pregnancy with increased pain in hips/ LBP with weight gain and low BP, Hx of C-section, fell down a flight of stairs in high school and slipped a disk and since that accident, pt's LBP has been a problem. Pt did stretches. yoga, and walking but did not undergo PT.    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  L hip flex/knee/flexion 5/5, R hip abd 3/5, L 3+/5        Palpation   Spinal mobility hypomobile C/T junction/ thoracic,   L sideflexion/ rotation with non radiating pain ,  AROM WFL     SI assessment  R iliac crest slightly higher than L       Bed Mobility   Bed Mobility --   head crunch                     Objective measurements completed on examination: See above findings.       [facility] Adult PT Treatment/Exercise - 12/19/19 1413      Bed Mobility   Bed Mobility --   head crunch     Neuro Re-ed    Neuro Re-ed Details  cued for HEP and body mechanics       Manual Therapy   Manual therapy comments STM/MWM at thoracic spine to address curves in spine                         PT Long Term Goals - 12/19/19 1411      PT  LONG TERM GOAL #1   Title Pt will demo equal alignment of pelvis and spine without deviations in order to progress to strengthening exercises and minimize pain    Time 4    Period Weeks    Status New    Target Date 01/16/20      PT LONG TERM GOAL #2   Title Pt will demo proper coordination of deep core mm and thoracolumbar / hip strengthening exercises to  minimize injuries during pregnancy    Time 6    Period Weeks    Status New    Target Date 01/30/20      PT LONG TERM GOAL #3   Title Pt will demo increased strength in hips to 4/5 B in order to promote pelvic stability during pregnancy to climb stairs safely    Baseline R hip flex 3/5, knee flex/ext 4/5.  L hip flex/knee/flexion 5/5, R hip abd 3/5, L 3+/5    Time 10    Period Weeks    Status New    Target Date 02/27/20      PT LONG TERM GOAL #4   Title Pt will report decreased urinary leakage by 50% in order to improve pelvic function    Time 8    Status New    Target Date 02/13/20      PT LONG TERM GOAL #5   Title Pt will demo IND with stability principles in body mechanics, HEP, strengthening and stretching exercises in order to manage EDS    Time 10    Period Weeks    Status New    Target Date 02/27/20                  Plan - 12/19/19 1410    Clinical Impression Statement Pt is a 24 yo female who is [redacted] weeks pregnant with her 2nd child. Two weeks ago, pt incurred acute LBP.  Pt's musculoskeletal assessment revealed pelvic misalignment and scoliotic curves, limited spinal /pelvic mobility, dyscoordination and strength of pelvic floor  mm, weak hip weakness, poor body mechanics which places strain on the abdominal/pelvic floor mm. These are deficits that indicate an ineffective intraabdominal pressure system associated with increased risk for pt's Sx.  Pt was provided education on etiology of Sx with anatomy, physiology explanation with images along with the benefits of customized pelvic PT Tx based on pt's medical  conditions and musculoskeletal deficits.  Explained the physiology of deep core mm coordination and roles of pelvic floor function in urination, defecation, sexual function, and postural control with deep core mm system.   Following Tx today which pt tolerated without complaints, pt demo'd equal alignment of pelvic girdle and increased spinal mobility and less pain. Specific education on stability principles to HEP and body mechanics were provided to help pt learn how to manage EDS and minimize risk for injuries.  Pt's preferred position with holding her toddler on her R hip is impacting her spinal deviations and pelvic misalignment. Pt was educated on equal weight bearing through BLE to promote equal alignment of pelvis and spine.  Plan to advance pt to deep core and hip strengthening at upcoming session.  Pt benefits from skilled PT.       Stability/Clinical Decision Making Evolving/Moderate complexity    Clinical Decision Making Moderate    Rehab Potential Good    PT Frequency 1x / week    PT Duration Other (comment)   10   PT Treatment/Interventions Neuromuscular re-education;Moist Heat;Scar mobilization;Manual techniques;Patient/family education;Therapeutic activities;Taping;Therapeutic exercise;Gait training;Functional mobility training    Consulted and Agree with Plan of Care Patient           Patient will benefit from skilled therapeutic intervention in order to improve the following deficits and impairments:  Decreased coordination, Decreased endurance, Decreased range of motion, Decreased balance, Decreased strength, Hypomobility, Decreased scar mobility, Decreased mobility, Abnormal gait, Improper body mechanics, Pain, Increased muscle spasms, Postural dysfunction, Increased fascial restricitons, Impaired flexibility, Decreased activity tolerance, Difficulty walking  Visit Diagnosis: Sacrococcygeal disorders, not elsewhere classified  Other lack of coordination  Ehlers-Danlos  disease     Problem List Patient Active Problem List   Diagnosis Date Noted  . Acute left-sided low back pain without sciatica 12/07/2019  . Pregnancy 11/24/2019  . Anemia, iron deficiency 11/11/2017  . Ehlers-Danlos syndrome 11/07/2017  . Anxiety and depression 03/09/2013    Mariane Masters ,PT, DPT, E-RYT  12/19/2019, 2:25 PM  [place] [facility] [place] MAIN [facility] SERVICES [address] [place], [place], [address] Phone: [phone]   Fax:  [phone]  Name: Cassidy Myers MRN: [id number] Date of Birth: Oct 28, 1995

## 2019-12-26 ENCOUNTER — Ambulatory Visit: Payer: 59 | Admitting: Physical Therapy

## 2019-12-26 ENCOUNTER — Other Ambulatory Visit: Payer: Self-pay

## 2019-12-26 DIAGNOSIS — M533 Sacrococcygeal disorders, not elsewhere classified: Secondary | ICD-10-CM

## 2019-12-26 DIAGNOSIS — Q796 Ehlers-Danlos syndrome, unspecified: Secondary | ICD-10-CM

## 2019-12-26 DIAGNOSIS — R278 Other lack of coordination: Secondary | ICD-10-CM

## 2019-12-26 NOTE — Patient Instructions (Signed)
Feet care :  Self -feet massage   Handshake : fingers between toes, moving ballmounds/toes back and forth several times while other hand anchors at arch. Do the same at the hind/mid foot.  Heel to toes upward to a letter Big Letter T strokes to spread ballmounds and toes, several times, pinch between webs of toes  Run finger tips along top of foot between long bones "comb between the bones"    Wiggle toes and spread them out when relaxing    ___  Walking with wider feet, under hip width  Higher knees, more ballmounds, not locking knees, lean forward more    ___  sidelying quad stretch

## 2019-12-26 NOTE — Therapy (Signed)
[place] [facility] MAIN [facility] SERVICES [address] [place], [place], [address] Phone: [phone]   Fax:  [phone]  Physical Therapy Treatment  Patient Details  Name: Cassidy Myers MRN: [id number] Date of Birth: Aug 15, 1995 Referring Provider (PT): Gweneth Dimitri MD   Encounter Date: 12/26/2019   PT End of Session - 12/26/19 1359    Visit Number 2    Number of Visits 10    Date for PT Re-Evaluation 02/26/20    PT Start Time 1303    PT Stop Time 1400    PT Time Calculation (min) 57 min           Past Medical History:  Diagnosis Date  . Acute cystitis without hematuria 11/24/2019  . ADHD   . Allergy   . Anxiety   . Depression   . Ehlers-Danlos syndrome   . PZWCHENI(778.2)     Past Surgical History:  Procedure Laterality Date  . CESAREAN SECTION N/A 11/08/2017   Procedure: CESAREAN SECTION;  Surgeon: Jaymes Graff, MD;  [facility]: [facility];  Service: Obstetrics;  Laterality: N/A;  . NO PAST SURGERIES    . WISDOM TOOTH EXTRACTION      There were no vitals filed for this visit.   Subjective Assessment - 12/26/19 1307    Subjective Pt is a belly sleeping and she is trying to get used to sleeping on her side with 10 pillows around her. Pt feels her back is not as tense with the HEP    Pertinent History EDS impacted her first pregnancy with increased pain in hips/ LBP with weight gain and low BP, Hx of C-section, fell down a flight of stairs in high school and slipped a disk and since that accident, pt's LBP has been a problem. Pt did stretches. yoga, and walking but did not undergo PT.    Patient Stated Goals feeling better              [facility] PT Assessment - 12/26/19 1308      Observation/Other Assessments   Scoliosis shoulders levelled       Strength   Overall Strength Comments B hip flex 4/5, hip abd  4/5       Palpation   SI assessment  levelled iliac crest     Palpation comment tightness at extensor digitorium/  adductror hallucis/ DAB/ PAD/ lumbricals B,  ITband and peroneal longus/ brevis B , glut med B                          [facility] Adult PT Treatment/Exercise - 12/26/19 1412      Therapeutic Activites    Therapeutic Activities --   self- feet massage      Neuro Re-ed    Neuro Re-ed Details  cued for gait with less heel striking, more transverse arch co-activation. ,       Manual Therapy   Manual therapy comments STM/MWM extensor digitorium/ adductror hallucis/ DAB/ PAD/ lumbricals B,  ITband and peroneal longus/ brevis B , glut med B     Kinesiotex Facilitate Muscle      Kinesiotix   Facilitate Muscle  toe abduction, mobility midfoot                       PT Long Term Goals - 12/19/19 1411      PT LONG TERM GOAL #1   Title Pt will demo equal alignment of pelvis and  spine without deviations in order to progress to strengthening exercises and minimize pain    Time 4    Period Weeks    Status New    Target Date 01/16/20      PT LONG TERM GOAL #2   Title Pt will demo proper coordination of deep core mm and thoracolumbar / hip strengthening exercises to  minimize injuries during pregnancy    Time 6    Period Weeks    Status New    Target Date 01/30/20      PT LONG TERM GOAL #3   Title Pt will demo increased strength in hips to 4/5 B in order to promote pelvic stability during pregnancy to climb stairs safely    Baseline R hip flex 3/5, knee flex/ext 4/5.  L hip flex/knee/flexion 5/5, R hip abd 3/5, L 3+/5    Time 10    Period Weeks    Status New    Target Date 02/27/20      PT LONG TERM GOAL #4   Title Pt will report decreased urinary leakage by 50% in order to improve pelvic function    Time 8    Status New    Target Date 02/13/20      PT LONG TERM GOAL #5   Title Pt will demo IND with stability principles in body mechanics, HEP, strengthening and stretching exercises in order to manage EDS    Time 10    Period Weeks    Status New    Target  Date 02/27/20                 Plan - 12/26/19 1416    Clinical Impression Statement Pt demo'd equal pelvic girdle and pt reported improved LBP after last session. Pt requried manual Tx to improve hip/feet mm to minimize knee adduction and heel striking in gait. Pt demo'd improved feet mobility and more transverse arch co-activation for stronger push up and less heel striking. Pt required cuse for knee alignment to minimize genu valgus and pronation. Pt continues benefit from skilled PT.    Stability/Clinical Decision Making Evolving/Moderate complexity    Rehab Potential Good    PT Frequency 1x / week    PT Duration Other (comment)   10   PT Treatment/Interventions Neuromuscular re-education;Moist Heat;Scar mobilization;Manual techniques;Patient/family education;Therapeutic activities;Taping;Therapeutic exercise;Gait training;Functional mobility training    Consulted and Agree with Plan of Care Patient           Patient will benefit from skilled therapeutic intervention in order to improve the following deficits and impairments:  Decreased coordination, Decreased endurance, Decreased range of motion, Decreased balance, Decreased strength, Hypomobility, Decreased scar mobility, Decreased mobility, Abnormal gait, Improper body mechanics, Pain, Increased muscle spasms, Postural dysfunction, Increased fascial restricitons, Impaired flexibility, Decreased activity tolerance, Difficulty walking  Visit Diagnosis: Sacrococcygeal disorders, not elsewhere classified  Other lack of coordination  Ehlers-Danlos disease     Problem List Patient Active Problem List   Diagnosis Date Noted  . Acute left-sided low back pain without sciatica 12/07/2019  . Pregnancy 11/24/2019  . Anemia, iron deficiency 11/11/2017  . Ehlers-Danlos syndrome 11/07/2017  . Anxiety and depression 03/09/2013    Mariane Masters ,PT, DPT, E-RYT  12/26/2019, 2:20 PM  [place] [facility] MAIN [facility] SERVICES [address] [place], [place], [address] Phone: [phone]   Fax:  [phone]  Name: Cassidy Myers MRN: [id number] Date of Birth: 02/27/1996

## 2020-01-17 ENCOUNTER — Ambulatory Visit: Payer: 59 | Attending: Family Medicine | Admitting: Physical Therapy

## 2020-01-17 DIAGNOSIS — Z419 Encounter for procedure for purposes other than remedying health state, unspecified: Secondary | ICD-10-CM | POA: Diagnosis not present

## 2020-01-18 LAB — OB RESULTS CONSOLE RUBELLA ANTIBODY, IGM: Rubella: IMMUNE

## 2020-01-24 ENCOUNTER — Ambulatory Visit: Payer: 59 | Admitting: Physical Therapy

## 2020-01-24 LAB — OB RESULTS CONSOLE RPR: RPR: NONREACTIVE

## 2020-01-24 LAB — OB RESULTS CONSOLE HIV ANTIBODY (ROUTINE TESTING): HIV: NONREACTIVE

## 2020-01-24 LAB — OB RESULTS CONSOLE HEPATITIS B SURFACE ANTIGEN: Hepatitis B Surface Ag: NEGATIVE

## 2020-01-29 LAB — OB RESULTS CONSOLE GC/CHLAMYDIA
Chlamydia: NEGATIVE
Gonorrhea: NEGATIVE

## 2020-01-31 ENCOUNTER — Ambulatory Visit: Payer: 59 | Admitting: Physical Therapy

## 2020-02-06 ENCOUNTER — Ambulatory Visit: Payer: 59 | Admitting: Physical Therapy

## 2020-02-07 ENCOUNTER — Ambulatory Visit: Payer: 59 | Admitting: Physical Therapy

## 2020-02-14 ENCOUNTER — Ambulatory Visit: Payer: 59 | Admitting: Physical Therapy

## 2020-02-16 DIAGNOSIS — Z419 Encounter for procedure for purposes other than remedying health state, unspecified: Secondary | ICD-10-CM | POA: Diagnosis not present

## 2020-02-17 ENCOUNTER — Other Ambulatory Visit: Payer: Self-pay

## 2020-02-17 ENCOUNTER — Emergency Department: Payer: 59

## 2020-02-17 ENCOUNTER — Emergency Department
Admission: EM | Admit: 2020-02-17 | Discharge: 2020-02-17 | Disposition: A | Discharge status: Home / Self Care | Payer: 59 | Attending: Emergency Medicine | Admitting: Emergency Medicine

## 2020-02-17 ENCOUNTER — Encounter: Payer: Self-pay | Admitting: Emergency Medicine

## 2020-02-17 DIAGNOSIS — Z3A18 18 weeks gestation of pregnancy: Secondary | ICD-10-CM | POA: Insufficient documentation

## 2020-02-17 DIAGNOSIS — N3090 Cystitis, unspecified without hematuria: Secondary | ICD-10-CM

## 2020-02-17 DIAGNOSIS — O26852 Spotting complicating pregnancy, second trimester: Secondary | ICD-10-CM | POA: Insufficient documentation

## 2020-02-17 DIAGNOSIS — N3000 Acute cystitis without hematuria: Secondary | ICD-10-CM | POA: Diagnosis not present

## 2020-02-17 DIAGNOSIS — O4690 Antepartum hemorrhage, unspecified, unspecified trimester: Secondary | ICD-10-CM

## 2020-02-17 DIAGNOSIS — R1030 Lower abdominal pain, unspecified: Secondary | ICD-10-CM | POA: Diagnosis present

## 2020-02-17 LAB — URINALYSIS, COMPLETE (UACMP) WITH MICROSCOPIC
Bilirubin Urine: NEGATIVE
Glucose, UA: NEGATIVE mg/dL
Hgb urine dipstick: NEGATIVE
Ketones, ur: NEGATIVE mg/dL
Nitrite: NEGATIVE
Protein, ur: NEGATIVE mg/dL
Specific Gravity, Urine: 1.013 (ref 1.005–1.030)
pH: 6 (ref 5.0–8.0)

## 2020-02-17 LAB — COMPREHENSIVE METABOLIC PANEL
ALT: 13 U/L (ref 0–44)
AST: 15 U/L (ref 15–41)
Albumin: 3.2 g/dL — ABNORMAL LOW (ref 3.5–5.0)
Alkaline Phosphatase: 66 U/L (ref 38–126)
Anion gap: 8 (ref 5–15)
BUN: 8 mg/dL (ref 6–20)
CO2: 20 mmol/L — ABNORMAL LOW (ref 22–32)
Calcium: 9.1 mg/dL (ref 8.9–10.3)
Chloride: 105 mmol/L (ref 98–111)
Creatinine, Ser: 0.49 mg/dL (ref 0.44–1.00)
GFR calc Af Amer: >60 mL/min (ref >60)
GFR calc non Af Amer: >60 mL/min (ref >60)
Glucose, Bld: 108 mg/dL — ABNORMAL HIGH (ref 70–99)
Potassium: 3.6 mmol/L (ref 3.5–5.1)
Sodium: 133 mmol/L — ABNORMAL LOW (ref 135–145)
Total Bilirubin: 0.6 mg/dL (ref 0.3–1.2)
Total Protein: 6.9 g/dL (ref 6.5–8.1)

## 2020-02-17 LAB — CBC
HCT: 31.8 % — ABNORMAL LOW (ref 36.0–46.0)
Hemoglobin: 10.8 g/dL — ABNORMAL LOW (ref 12.0–15.0)
MCH: 28.6 pg (ref 26.0–34.0)
MCHC: 34 g/dL (ref 30.0–36.0)
MCV: 84.1 fL (ref 80.0–100.0)
Platelets: 290 10*3/uL (ref 150–400)
RBC: 3.78 MIL/uL — ABNORMAL LOW (ref 3.87–5.11)
RDW: 13.8 % (ref 11.5–15.5)
WBC: 9.1 10*3/uL (ref 4.0–10.5)
nRBC: 0 % (ref 0.0–0.2)

## 2020-02-17 LAB — HCG, QUANTITATIVE, PREGNANCY: hCG, Beta Chain, Quant, S: 64073 m[IU]/mL — ABNORMAL HIGH (ref <5)

## 2020-02-17 LAB — KLEIHAUER-BETKE STAIN
# Vials RhIg: 1
Fetal Cells %: 0.2 %
Quantitation Fetal Hemoglobin: 0.001 mL

## 2020-02-17 LAB — POCT PREGNANCY, URINE: Preg Test, Ur: POSITIVE — AB

## 2020-02-17 LAB — ABO/RH: ABO/RH(D): O NEG

## 2020-02-17 LAB — ANTIBODY SCREEN: Antibody Screen: NEGATIVE

## 2020-02-17 MED ORDER — NITROFURANTOIN MACROCRYSTAL 100 MG PO CAPS: 100.0000 mg | ORAL_CAPSULE | Freq: Two times a day (BID) | ORAL | 0 refills | Status: AC

## 2020-02-17 MED ORDER — RHO D IMMUNE GLOBULIN 1500 UNIT/2ML IJ SOSY
300.0000 ug | PREFILLED_SYRINGE | Freq: Once | INTRAMUSCULAR | Status: AC
  Administered 2020-02-17: 300 ug via INTRAMUSCULAR
  Filled 2020-02-17: qty 2

## 2020-02-17 NOTE — ED Triage Notes (Addendum)
Pt has had lower cramping for last week.  Today began with light vaginal spotting.  Is [redacted] weeks pregnant G2P1.  Has not seen doctor this week for the cramping.  Pain has been frequent but comes and goes.  No fever.  From 2019 pt is O negative; has not had a rhogam shot this pregnancy. + dysuria and urinary frequency.

## 2020-02-17 NOTE — Discharge Instructions (Signed)
We gave a dose of RhoGAM today.  A KB stain was negative.    Also, please pick up nitrofurantoin (macrobid) for your UTI symptoms.

## 2020-02-17 NOTE — ED Provider Notes (Signed)
[facility] Emergency Department Provider Note  ____________________________________________  Time seen: Approximately 10:43 PM  I have reviewed the triage vital signs and the nursing notes.   HISTORY  Chief Complaint Vaginal Bleeding    HPI Cassidy Myers is a 24 y.o. female with a history of Ehlers-Danlos syndrome, recurrent UTI in pregnancy, currently [redacted] weeks pregnant G2 P1  followed by OB Dr. Lorane Gell in [place].  She comes the ED complaining of lower abdominal cramping pain for the past week, intermittent, no aggravating or alleviating factors, nonradiating.  Associated with some dysuria and urinary frequency.  She also developed some light vaginal spotting which started today.  No leakage of fluid, no contractions.  No fevers or chills, no chest pain or shortness of breath.  She has been having persistent nausea throughout pregnancy, and due to a history of POTS also gets dizzy episodes, but none of this is worse than usual.  She feels that she is drinking fluids well and staying hydrated.     Past Medical History:  Diagnosis Date  . Acute cystitis without hematuria 11/24/2019  . ADHD   . Allergy   . Anxiety   . Depression   . Ehlers-Danlos syndrome   . LZJQBHAL(937.9)      Patient Active Problem List   Diagnosis Date Noted  . Acute left-sided low back pain without sciatica 12/07/2019  . Pregnancy 11/24/2019  . Anemia, iron deficiency 11/11/2017  . Ehlers-Danlos syndrome 11/07/2017  . Anxiety and depression 03/09/2013     Past Surgical History:  Procedure Laterality Date  . CESAREAN SECTION N/A 11/08/2017   Procedure: CESAREAN SECTION;  Surgeon: Jaymes Graff, MD;  [facility];  Service: Obstetrics;  Laterality: N/A;  . NO PAST SURGERIES    . WISDOM TOOTH EXTRACTION       Prior to Admission medications   Medication Sig Start Date End Date Taking? Authorizing Provider  acetaminophen (TYLENOL) 500 MG tablet Take  500 mg by mouth every 6 (six) hours as needed. 1-2 tablets    [provider]  cyclobenzaprine (FLEXERIL) 5 MG tablet Take 1 tablet (5 mg total) by mouth 3 (three) times daily as needed for muscle spasms. 12/07/19   Lynnda Child, MD  ELDERBERRY PO Take by mouth daily.     [provider]  furosemide (LASIX) 20 MG tablet Take 1 tablet (20 mg total) by mouth daily as needed (As needed for swelling). 08/08/19 11/06/19  Antonieta Iba, MD  midodrine (PROAMATINE) 10 MG tablet Take 1 tablet (10 mg total) by mouth 3 (three) times daily as needed (As needed for blood pressure support.). Patient not taking: Reported on 12/07/2019 08/08/19   Antonieta Iba, MD  nitrofurantoin (MACRODANTIN) 100 MG capsule Take 1 capsule (100 mg total) by mouth 2 (two) times daily for 7 days. 02/17/20 02/24/20  Sharman Cheek, MD  Prenatal Vit-Fe Fumarate-FA (PRENATAL VITAMINS PO) Take 1 tablet by mouth daily.    [provider]     Allergies Latex, Sulfa antibiotics, and Shellfish allergy   Family History  Problem Relation Age of Onset  . Cancer Mother        Cervical  . Asthma Mother   . Depression Mother   . Depression Father   . Schizophrenia Father   . Clotting disorder Father   . Depression Maternal Grandmother   . Depression Maternal Grandfather   . Depression Paternal Grandmother   . Diabetes Paternal Grandmother   . Depression Paternal Grandfather   .  Diabetes Paternal Grandfather     Social History Social History   Tobacco Use  . Smoking status: Never Smoker  . Smokeless tobacco: Never Used  Vaping Use  . Vaping Use: Never used  Substance Use Topics  . Alcohol use: Yes    Comment: socially  . Drug use: No    Review of Systems  Constitutional:   No fever or chills.  ENT:   No sore throat. No rhinorrhea. Cardiovascular:   No chest pain or syncope. Respiratory:   No dyspnea or cough. Gastrointestinal:   Positive as above for abdominal pain and occasional  vomiting.  No constipation or diarrhea.  Musculoskeletal:   Negative for focal pain or swelling All other systems reviewed and are negative except as documented above in ROS and HPI.  ____________________________________________   PHYSICAL EXAM:  VITAL SIGNS: ED Triage Vitals  Enc Vitals Group     BP 02/17/20 1634 104/75     Pulse Rate 02/17/20 1634 (!) 109     Resp 02/17/20 1634 18     Temp 02/17/20 1634 98.3 F (36.8 C)     Temp Source 02/17/20 1634 Oral     SpO2 02/17/20 1634 98 %     Weight 02/17/20 1635 190 lb (86.2 kg)     Height 02/17/20 1635 5\' 4"  (1.626 m)     Head Circumference --      Peak Flow --      Pain Score 02/17/20 1634 7     Pain Loc --      Pain Edu? --      Excl. in GC? --     Vital signs reviewed, nursing assessments reviewed.   Constitutional:   Alert and oriented. Non-toxic appearance. Eyes:   Conjunctivae are normal. EOMI. PERRL. ENT      Head:   Normocephalic and atraumatic.      Nose:   Wearing a mask.      Mouth/Throat:   Wearing a mask.      Neck:   No meningismus. Full ROM. Hematological/Lymphatic/Immunilogical:   No cervical lymphadenopathy. Cardiovascular:   RRR. Symmetric bilateral radial and DP pulses.  No murmurs. Cap refill less than 2 seconds. Respiratory:   Normal respiratory effort without tachypnea/retractions. Breath sounds are clear and equal bilaterally. No wheezes/rales/rhonchi. Gastrointestinal:   Soft with mild suprapubic and left lower quadrant tenderness.  Gravid abdomen, size consistent with dates.   No rebound, rigidity, or guarding. Genitourinary:   deferred Musculoskeletal:   Normal range of motion in all extremities. No joint effusions.  No lower extremity tenderness.  No edema. Neurologic:   Normal speech and language.  Motor grossly intact. No acute focal neurologic deficits are appreciated.  Skin:    Skin is warm, dry and intact. No rash noted.  No petechiae, purpura, or  bullae.  ____________________________________________    LABS (pertinent positives/negatives) (all labs ordered are listed, but only abnormal results are displayed) Labs Reviewed  COMPREHENSIVE METABOLIC PANEL - Abnormal; Notable for the following components:      Result Value   Sodium 133 (*)    CO2 20 (*)    Glucose, Bld 108 (*)    Albumin 3.2 (*)    All other components within normal limits  CBC - Abnormal; Notable for the following components:   RBC 3.78 (*)    Hemoglobin 10.8 (*)    HCT 31.8 (*)    All other components within normal limits  URINALYSIS, COMPLETE (UACMP) WITH MICROSCOPIC - Abnormal; Notable  for the following components:   Color, Urine YELLOW (*)    APPearance HAZY (*)    Leukocytes,Ua LARGE (*)    Bacteria, UA FEW (*)    All other components within normal limits  HCG, QUANTITATIVE, PREGNANCY - Abnormal; Notable for the following components:   hCG, Beta Chain, Quant, S 64,073 (*)    All other components within normal limits  POCT PREGNANCY, URINE - Abnormal; Notable for the following components:   Preg Test, Ur POSITIVE (*)    All other components within normal limits  KLEIHAUER-BETKE STAIN  POC URINE PREG, ED  ABO/RH  ANTIBODY SCREEN  RHOGAM INJECTION   ____________________________________________   EKG    ____________________________________________    RADIOLOGY  US OB Limited  Result Date: 02/17/2020 CLINICAL DATA:  Vaginal bleeding. EXAM: LIMITED OBSTETRIC ULTRASOUND FINDINGS: Number of Fetuses: 1 Heart Rate:  157 bpm Movement: Yes Presentation: Transverse Placental Location: Anterior Previa: No Amniotic Fluid (Subjective):  Within normal limits. AFI: N/A cm BPD: 3.8 cm 17 w  4 d MATERNAL FINDINGS: Cervix:  Appears closed (4.8 cm in length). Uterus/Adnexae: No abnormality visualized. IMPRESSION: Single, viable intrauterine pregnancy at approximately 17 weeks and 4 days gestation by ultrasound evaluation. This exam is performed on an emergent  basis and does not comprehensively evaluate fetal size, dating, or anatomy; follow-up complete OB US should be considered if further fetal assessment is warranted. Electronically Signed   By: Aram Candelahaddeus  Houston M.D.   On: 02/17/2020 18:58    ____________________________________________   PROCEDURES Procedures  ____________________________________________  DIFFERENTIAL DIAGNOSIS   Subchorionic hematoma, SAB, idiopathic bleeding, cystitis  CLINICAL IMPRESSION / ASSESSMENT AND PLAN / ED COURSE  Medications ordered in the ED: Medications - No data to display  Pertinent labs & imaging results that were available during my care of the patient were reviewed by me and considered in my medical decision making (see chart for details).  Brecken Gamm was evaluated in Emergency Department on 02/17/2020 for the symptoms described in the history of present illness. She was evaluated in the context of the global COVID-19 pandemic, which necessitated consideration that the patient might be at risk for infection with the SARS-CoV-2 virus that causes COVID-19. Institutional protocols and algorithms that pertain to the evaluation of patients at risk for COVID-19 are in a state of rapid change based on information released by regulatory bodies including the CDC and federal and state organizations. These policies and algorithms were followed during the patient's care in the ED.   Patient presents with symptoms of cystitis as well as some vaginal bleeding in second trimester pregnancy.  Urinalysis consistent with UTI.  Patient states that she has been waiting for pharmacy to be out of file antibiotic prescription that her doctor had already submitted for her.  She has been treated with Keflex for 2 prior UTIs during this pregnancy so I will send a prescription for Macrobid.  Cystitis may be provoking the pelvic pain and vaginal bleeding.  Pelvic ultrasound is unremarkable, normal amniotic fluid, observe fetal  movements, normal fetal heart rate.  KB stain is essentially negative, but patient is Rh-negative so we will give her a dose of RhoGam and refer back to her OB for continued evaluation of her nausea symptoms which are inadequately controlled by Diclegis that she is currently taking.  Doubt appendicitis, no evidence of ovarian cyst or torsion, doubt STI PID or TOA.      ____________________________________________   FINAL CLINICAL IMPRESSION(S) / ED DIAGNOSES    Final  diagnoses:  Vaginal bleeding in pregnancy  Cystitis     ED Discharge Orders         Ordered    nitrofurantoin (MACRODANTIN) 100 MG capsule  2 times daily        02/17/20 2242          Portions of this note were generated with dragon dictation software. Dictation errors may occur despite best attempts at proofreading.   Sharman Cheek, MD 02/17/20 [phone]

## 2020-02-18 LAB — RHOGAM INJECTION: Unit division: 0

## 2020-02-19 ENCOUNTER — Telehealth: Payer: Self-pay

## 2020-02-19 NOTE — Telephone Encounter (Signed)
Transition Care Management Follow-up Telephone Call  Date of discharge and from where:  02/17/2020 from [facility]  How have you been since you were released from the hospital? Patient states that she is doing well and that everyone at the hospital was great and she really like the doctor that she had.   Any questions or concerns? No  Items Reviewed:  Did the pt receive and understand the discharge instructions provided? Yes   Medications obtained and verified? Yes   Any new allergies since your discharge? Yes   Dietary orders reviewed? Yes  Do you have support at home? Yes   Functional Questionnaire: (I = Independent and D = Dependent) ADLs: I Bathing/Dressing- I Meal Prep- I Eating- I Maintaining continence- I Transferring/Ambulation- I Managing Meds- I  Follow up appointments reviewed:   Specialist Hospital f/u appt confirmed? Yes  Scheduled to see [facility] on 02/21/2020 @ 11am.  Are transportation arrangements needed? No   If their condition worsens, is the pt aware to call PCP or go to the Emergency Dept.? Yes  Was the patient provided with contact information for the PCP's office or ED? Yes  Was to pt encouraged to call back with questions or concerns? Yes

## 2020-02-21 ENCOUNTER — Ambulatory Visit: Payer: 59 | Admitting: Physical Therapy

## 2020-02-28 ENCOUNTER — Ambulatory Visit: Payer: 59 | Admitting: Physical Therapy

## 2020-03-06 ENCOUNTER — Ambulatory Visit: Payer: 59 | Admitting: Physical Therapy

## 2020-03-13 ENCOUNTER — Ambulatory Visit: Payer: 59 | Admitting: Physical Therapy

## 2020-03-18 DIAGNOSIS — Z419 Encounter for procedure for purposes other than remedying health state, unspecified: Secondary | ICD-10-CM | POA: Diagnosis not present

## 2020-04-09 ENCOUNTER — Encounter: Payer: Self-pay | Admitting: Family Medicine

## 2020-04-09 ENCOUNTER — Other Ambulatory Visit: Payer: Self-pay

## 2020-04-09 ENCOUNTER — Ambulatory Visit (INDEPENDENT_AMBULATORY_CARE_PROVIDER_SITE_OTHER): Payer: 59 | Admitting: Family Medicine

## 2020-04-09 VITALS — BP 98/60 | HR 105 | Temp 97.5°F | Wt 206.5 lb

## 2020-04-09 DIAGNOSIS — K0889 Other specified disorders of teeth and supporting structures: Secondary | ICD-10-CM | POA: Diagnosis not present

## 2020-04-09 DIAGNOSIS — K029 Dental caries, unspecified: Secondary | ICD-10-CM

## 2020-04-09 NOTE — Patient Instructions (Signed)
Go to [facility] to see Dr. Adriana Simas at 7:30 in the morning for a triage appointment

## 2020-04-09 NOTE — Progress Notes (Signed)
Subjective:     Cassidy Myers is a 24 y.o. female presenting for Dental Pain (L side x 1 week )     HPI   #Dental pain - left side - started 1 week ago - jaw has swollen some and is hot to the touch - yesterday had a headache and swollen gums - no fever, chills, runny nose, congestion - no ear pain  Drinking lots of milk Limits soda intake    Review of Systems   Social History   Tobacco Use  Smoking Status Never Smoker  Smokeless Tobacco Never Used        Objective:    BP Readings from Last 3 Encounters:  04/09/20 98/60  02/17/20 102/73  12/07/19 (!) 100/62   Wt Readings from Last 3 Encounters:  04/09/20 206 lb 8 oz (93.7 kg)  02/17/20 190 lb (86.2 kg)  12/07/19 197 lb (89.4 kg)    BP 98/60   Pulse (!) 105   Temp (!) 97.5 F (36.4 C) (Temporal)   Wt 206 lb 8 oz (93.7 kg)   LMP 07/09/2019   SpO2 98%   Breastfeeding No   BMI 35.45 kg/m    Physical Exam Constitutional:      General: She is not in acute distress.    Appearance: She is well-developed. She is not diaphoretic.  HENT:     Right Ear: Tympanic membrane and external ear normal.     Left Ear: Tympanic membrane and external ear normal.     Nose: Nose normal.     Mouth/Throat:     Lips: Pink.     Mouth: Mucous membranes are moist.     Dentition: Dental caries present. No dental abscesses.     Comments: TTP along the upper left molar and lower left molar. Cavity noted on the upper left. No swelling in the gum but some ttp. No ttp on the right teeth Eyes:     Conjunctiva/sclera: Conjunctivae normal.  Cardiovascular:     Rate and Rhythm: Normal rate.  Pulmonary:     Effort: Pulmonary effort is normal.  Musculoskeletal:     Cervical back: Neck supple.  Skin:    General: Skin is warm and dry.     Capillary Refill: Capillary refill takes less than 2 seconds.  Neurological:     Mental Status: She is alert. Mental status is at baseline.  Psychiatric:        Mood and Affect: Mood  normal.        Behavior: Behavior normal.           Assessment & Plan:   Problem List Items Addressed This Visit    None    Visit Diagnoses    Dental caries    -  Primary   Pain, dental         Pt currently pregnant and with new onset dental pain and intermittent jaw pain/swelling.   Concerning for possible dental infection vs cavity related pain vs other.   Called her dentist and arranged for urgent triage appointment tomorrow at 7:30. Pt will go. Discussed deferring antibiotic treatment to her dental appointment and evaluation.   ER precautions for worsening symtpoms  Return if symptoms worsen or fail to improve.  Lynnda Child, MD  This visit occurred during the SARS-CoV-2 public health emergency.  Safety protocols were in place, including screening questions prior to the visit, additional usage of staff PPE, and extensive cleaning of exam room while observing appropriate contact  time as indicated for disinfecting solutions.

## 2020-04-16 ENCOUNTER — Ambulatory Visit: Payer: 59 | Admitting: Internal Medicine

## 2020-04-17 DIAGNOSIS — Z419 Encounter for procedure for purposes other than remedying health state, unspecified: Secondary | ICD-10-CM | POA: Diagnosis not present

## 2020-04-22 ENCOUNTER — Ambulatory Visit: Payer: 59

## 2020-04-23 ENCOUNTER — Other Ambulatory Visit: Payer: Self-pay

## 2020-04-23 ENCOUNTER — Ambulatory Visit: Payer: Self-pay | Admitting: Genetic Counselor

## 2020-04-23 ENCOUNTER — Ambulatory Visit: Payer: 59 | Attending: Obstetrics and Gynecology | Admitting: Genetic Counselor

## 2020-04-23 ENCOUNTER — Other Ambulatory Visit: Payer: Self-pay | Admitting: *Deleted

## 2020-04-23 DIAGNOSIS — Q7962 Hypermobile Ehlers-Danlos syndrome: Secondary | ICD-10-CM | POA: Diagnosis not present

## 2020-04-23 DIAGNOSIS — Q796 Ehlers-Danlos syndrome, unspecified: Secondary | ICD-10-CM

## 2020-04-23 DIAGNOSIS — Z315 Encounter for genetic counseling: Secondary | ICD-10-CM

## 2020-04-23 NOTE — Progress Notes (Addendum)
04/23/2020  Cassidy Myers Feb 11, 1996 MRN: [id number] DOV: 04/23/2020  Cassidy Myers presented to the [facility] for a genetics consultation regarding her history of Ehlers Danlos syndrome. Cassidy Myers was accompanied to her appointment by her mother, Cassidy Myers.   Indication for genetic counseling - Personal history of hypermobile Ehlers Danlos syndrome (hEDS)  Prenatal history  Cassidy Myers is a [id number], 24 y.o. female. Her current pregnancy has completed [redacted]w[redacted]d(Estimated Date of Delivery: 07/18/20). Cassidy Myers a two and a h58year old daughter named Cassidy Myers from a previous relationship. Her daughter was born via Cesarean section due to dystocia and failure to dilate. Her pregnancy was significant for polyhydramnios. Ms. HBrazellrequired a blood transfusion after delivery due to anemia. She reported that it took several months for her Cesarean section incision to heal. The current pregnancy is with a new partner.  [name] exposure to environmental toxins or chemical agents. She denied the use of alcohol, tobacco or street drugs. She reported taking prenatal vitamins, Tylenol, Keflex, Flexavil, and Zofran. She denied significant viral illnesses, fevers, and bleeding during the course of her pregnancy. She reported a history of hEDS with POTS, syncope, prolonged healing, easy bruising, and joint pain. She also reported a history of UTIs and low blood pressure. Her medical and surgical histories were otherwise noncontributory.  Family History  A three generation pedigree was drafted and reviewed. Cassidy Myers's mother was the main historian for the m60of family history collection. The family history is remarkable for the following:  - Cassidy Myers has a significant family history of symptoms that can be associated with EDS, including hypermobility, prolonged healing, easy bruising, and constipation/bowel dysmotility. Her maternal grandmother also reportedly had a  history of uterine prolapse. Additionally, Cassidy Myers has a history of UTIs and her daughter has a history of kidney/bladder problems. Ms. HHughleyand her mother remarked that [name]'s diagnosis of hEDS seemed to make sense of this family history which was unclear for some time. See Discussion section for more details.  - Cassidy Myers has a family history of autism and speech delays. Her maternal half brother Cassidy Myers's daughter has a speech impediment. Ms. Lorenzi's maternal half brother Cassidy Myers autism, speech delays, and juvenile arthritis. Another maternal half brother, Cassidy Myers has several medical and developmental problems, including speech delays and delays in potty training, wide/flat feet, defined calves, and "different" facial features.   - Cassidy Myers has a family history of syndactyly in the hands and/or feet in her mother, maternal half brother, maternal half brother's daughter, maternal aunt, and maternal uncle.  - Cassidy Myers's mother reportedly had 12 spontaneous miscarriages under 14 weeks' gestation. Cassidy Myers's maternal grandmother also reportedly had a history of miscarriages.   We discussed that 2-5% of individuals who experience multiple miscarriages carry a balanced chromosomal arrangement that can become unbalanced and potentially lethal in their offspring. Unbalanced rearrangements can also lead to postnatal features such as developmental delays, autism, and physical differences. If there is a genetic explanation for Cassidy Myers's mother's history of miscarriages, it is possible that this could have implications for the health and development of her other family members. However, without further information about the cause of recurrent pregnancy loss, developmental delays, autism, and physical differences in the family, risk assessment is limited. We discussed that a referral to pediatric genetics for [facility]/or Cassidy Leavermay be warranted. Their pediatrician could consider placing a  referral if the family is interested in pursuing  this.  The remaining family histories were reviewed and found to be noncontributory for birth defects, intellectual disability, recurrent pregnancy loss, and known genetic conditions. Per Cassidy Myers's mother, Cassidy Myers does not know her biological father. There is a family history of mental illness; however, additional information about her paternal family history is limited. Cassidy Myers also had limited information about her partner's extended family history.   The patient's ancestry [place], and Native American ([place]). The father of the pregnancy's ethnicity is [place]. Ashkenazi Jewish ancestry and consanguinity were denied. Pedigree will be scanned under Media.  Discussion  Ehlers Danlos syndrome:  Cassidy Myers was seen by Cassidy Myers of [facility] in 2017 where she was diagnosed with possible hypermobile Ehlers Danlos syndrome (hEDS). She also saw Cassidy Myers for genetic counseling during her pregnancy with her daughter in 2019. Cassidy Myers's OBGYN provider has referred her to [facility] for her history of hEDS; however, their waitlist to see EDS patients is currently very long.  We reviewed that Cassidy Myers Danlos syndrome (EDS) is the term for a group of connective tissue disorders that affect the skin, bones, blood vessels, and other organs/tissues throughout the body. According to the 2017 international classification of EDS, there are 13 distinct subtypes of EDS, including classical EDS, classical-like EDS, cardiac-valvular EDS, vascular EDS, hypermobile EDS, arthrochalasia EDS, dermatosparaxis EDS, kyphoscoliotic EDS, brittle cornea syndrome, spondylodysplastic EDS, musculocontractural EDS, myopathic EDS, and periodontal EDS. EDS affects at least 1 in every 5000 individuals. The hypermobile and classical forms of the condition are most common.    Individuals with EDS often have certain features in  common, such as joint hypermobility, hypotonia, delayed motor skills, joint dislocations, chronic pain, skin abnormalities, easy bruising, and abnormal scarring. Each subtype is associated with additional distinct features. Hypermobile EDS (hEDS) has a complex set of clinical diagnostic criteria. These criteria include joint hypermobility, signs of faulty connective tissue throughout the body (e.g. skin features, hernias, prolapses), a family history of the condition, and musculoskeletal problems (e.g. long-term pain, dislocations). There are many associated symptoms that are not part of the formal criteria for hEDS, such as postural orthostatic tachycardia syndrome (POTS), digestive disorders, pelvic and bladder dysfunction, and anxiety disorders. Many individuals do not fully meet diagnostic criteria for hEDS but experience hypermobility that interferes with their life in some way. After other possible conditions are excluded, a diagnosis of generalized hypermobility spectrum disorder may be made for these individuals.    EDS is associated with at least 20 different genes; however, there has not yet been a genetic cause identified for hEDS. Many forms of EDS, including hEDS, are inherited in an autosomal dominant fashion. This means that individuals with EDS symptoms are often seen in every generation of a family. When an individual has EDS following an autosomal dominant pattern of inheritance, they have a 1 in 2 (50%) chance of passing it on to each of their children. Given Ms. Leason's personal and family history, she was counseled that she has a 50% chance of having a child who will be affected by hEDS with each pregnancy.   Ms. Holohan has not had genetic testing for EDS. We discussed that while multigene panels for EDS are available, the utility of genetic testing is often limited for patients with hEDS. This is due to the fact that the precise genetic cause of hEDS is unknown. We discussed that many  people with hEDS who undergo multigene panel sequencing are identified to have a  variant of uncertain significance (VUS) within one or more genes on the panel. A VUS means that a change was identified in one of the genes that was tested; however, it is unclear if this change is benign (not associated with EDS) or pathogenic (known to be associated with EDS). When a greater number of genes are sequenced, the likelihood of identifying a VUS increases. Additionally, it is not recommend that prenatal diagnostic testing be performed to identify a VUS in a fetus given the uncertainty surrounding its clinical implications. Ms. Blash understood these limitations and was not interested in pursuing multigene sequencing for EDS at this time.  Carrier screening results:  We reviewed Ms. Yandow had negative carrier screening for cystic fibrosis (CF), spinal muscular atrophy (SMA), and hemoglobinopathies through [facility] during her pregnancy with her daughter. Ms. Lannom was counseled that this result significantly reduces, but does not eliminate her risk to be a carrier for one of these conditions. Thus, her chances of having a baby with one of these conditions has also been decreased. She was informed that CF, SMA, and select hemoglobinopathies are included on [place]'s newborn screen.  Aneuploidy screening:  Given that Ms. Goodine has not had screening for chromosomal aneuploidies, we discussed the option of noninvasive prenatal screening (NIPS) as an available screening option. Specifically, we discussed that NIPS analyzes cell free DNA originating from the placenta that is found in the maternal blood circulation during pregnancy. This test is not diagnostic for chromosome conditions, but can provide information regarding the presence or absence of extra fetal DNA for chromosomes 13, 18, 21, and the sex chromosomes. Thus, it would not identify or rule out all fetal aneuploidy. The reported  detection rate is 91-99% for trisomies 21, 18, 13, and sex chromosome aneuploidies. The false positive rate is reported to be less than 0.1% for any of these conditions. Ms. Janeway indicated that she is not interested in undergoing NIPS.  Diagnostic testing:  Ms. Mormile was also counseled regarding diagnostic testing via amniocentesis. We discussed the technical aspects of the procedure and quoted up to a 1 in 500 (0.2%) risk for spontaneous pregnancy loss, preterm labor, or other adverse pregnancy outcomes as a result of amniocentesis. Cultured cells from an amniocentesis sample allow for the visualization of a fetal karyotype, which can detect >99% of large chromosomal aberrations. Chromosomal microarray can also be performed to identify smaller deletions or duplications of fetal chromosomal material. We discussed that since Ms. Krasner has not undergone genetic testing for EDS, and that since hEDS does not have a known genetic cause, testing for hEDS would not be possible on an amniocentesis sample.   After careful consideration, Ms. Dacruz declined amniocentesis at this time. She understands that amniocentesis is available at any point after 16 weeks of pregnancy and that she may opt to undergo the procedure at a later date should she change her mind.  Plan:  Given Ms. Kristiansen's history of possible hEDS and polyhydramnios in her previous pregnancy, it is recommended that she have a detailed ultrasound here in MFM. Ms. Raigoza informed me that she would ideally like to have a natural birth, as she reportedly had a traumatic birth experience with her daughter due to a bad reaction to her epidural and C-section. She also would like to have her tubes tied following delivery of this baby. Based on her concerns and questions regarding delivery plans, I recommended an MD consultation here in MFM, which Ms. Faul and her mother expressed interest  in. We will schedule her for these appointments.  Ms. Bilek  reported that she is connected with several Facebook groups for individuals with EDS which she has found helpful. I offered additional support resources and information about EDS, which Ms. Shipman declined at this time.  I counseled Ms. Toelle regarding the above risks and available options. The approximate face-to-face time with the genetic counselor was 90 minutes.  In summary:  Discussed diagnosis of possible hypermobile Ehlers Danlos syndrome  No known genetic cause so prenatal diagnosis unavailable  50% chance her children could also have hypermobile EDS  Reviewed negative carrier screening results  Reduced chance to be carrier for cystic fibrosis, spinal muscular atrophy, or hemoglobinopathies  Offered additional testing and screening  Declined NIPS and amniocentesis  Recommend detailed ultrasound and MD consult in MFM. We will schedule these appointments  Reviewed family history concerns   Buelah Manis, MS, [facility] Genetic Counselor

## 2020-04-26 ENCOUNTER — Encounter: Payer: Self-pay | Admitting: *Deleted

## 2020-04-30 ENCOUNTER — Ambulatory Visit: Payer: 59 | Admitting: *Deleted

## 2020-04-30 ENCOUNTER — Other Ambulatory Visit: Payer: Self-pay

## 2020-04-30 ENCOUNTER — Ambulatory Visit: Payer: 59 | Attending: Obstetrics and Gynecology

## 2020-04-30 ENCOUNTER — Encounter: Payer: Self-pay | Admitting: *Deleted

## 2020-04-30 ENCOUNTER — Ambulatory Visit (HOSPITAL_BASED_OUTPATIENT_CLINIC_OR_DEPARTMENT_OTHER): Payer: 59 | Admitting: Obstetrics and Gynecology

## 2020-04-30 VITALS — BP 106/60 | HR 105

## 2020-04-30 DIAGNOSIS — I498 Other specified cardiac arrhythmias: Secondary | ICD-10-CM | POA: Insufficient documentation

## 2020-04-30 DIAGNOSIS — Q7962 Hypermobile Ehlers-Danlos syndrome: Secondary | ICD-10-CM | POA: Insufficient documentation

## 2020-04-30 DIAGNOSIS — O99891 Other specified diseases and conditions complicating pregnancy: Secondary | ICD-10-CM | POA: Diagnosis not present

## 2020-04-30 DIAGNOSIS — Z363 Encounter for antenatal screening for malformations: Secondary | ICD-10-CM

## 2020-04-30 DIAGNOSIS — G90A Postural orthostatic tachycardia syndrome (POTS): Secondary | ICD-10-CM

## 2020-04-30 DIAGNOSIS — Q796 Ehlers-Danlos syndrome, unspecified: Secondary | ICD-10-CM | POA: Diagnosis not present

## 2020-04-30 DIAGNOSIS — O99213 Obesity complicating pregnancy, third trimester: Secondary | ICD-10-CM

## 2020-04-30 DIAGNOSIS — O34219 Maternal care for unspecified type scar from previous cesarean delivery: Secondary | ICD-10-CM | POA: Diagnosis present

## 2020-04-30 DIAGNOSIS — E669 Obesity, unspecified: Secondary | ICD-10-CM

## 2020-04-30 DIAGNOSIS — Z3A28 28 weeks gestation of pregnancy: Secondary | ICD-10-CM

## 2020-05-01 NOTE — Progress Notes (Addendum)
Maternal-Fetal Medicine   Name: Cassidy Myers DOB: [birth year] MRN: [id number] Referring Provider: Candice Camp, MD  I had the [facility] of [facility] Templeton [facility]. She is G2 P1at 28w 4d gestation and is here for ultrasound and consultation.   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 It was diagnosed by her obstetrician in her previous pregnancy when she saw our patient with hypermobile joints. She also had dizziness, tiredness and swelling of feet. Patient experienced POTS symptoms.  She takes about 15 minutes to slowly get out of bed to avoid POTS symptoms. She uses compression stockings while on errands.  She has not had symptoms of preterm labor in this pregnancy.  Review of symptoms: Occasional headaches (no formal diagnosis of migraine), occasional "spots before eyes", mild shortness of breath and palpitations with exertions, has intermittent nausea and vomiting. No constipation or diarrhea. No abdominal or joint pains, no vaginal bleeding. No history of recurrent UTIs.  Past surgical history: Cesarean delivery. Medications: Prenatal vitamins, Flexeril. Allergies: Sulfa ("flare up" and vomiting) Social: Denies tobacco or drug or alcohol use. She is living with her partner who is not the father of her first child.  Family: Mother and sister "probably" have EDS. No history of venous thromboembolism in the family.  Obstetric history is significant for a term cesarean delivery (failure to progress in labor) in June 2019 of a female infant weighing 7-13 at birth. Her pregnancy was, otherwise, uneventful.  Prenatal course: She reports cell-free fetal DNA screening showed low risk for fetal aneuploidies. She does not have gestational diabetes and her blood pressures have been normal at prenatal  visits.  Ultrasound Fetal growth is appropriate for gestational age.  Amniotic fluid is normal and good fetal activity seen.  Fetal anatomical survey appears normal but limited by advanced gestational age.  Placenta is anterior and there is no evidence of previa or accreta.  Our concerns include:  Hypermobile Ehlers-Danlos Syndrome - The 2017 International Classification recognizes 13 subtypes of EDS. Hypermobile EDS (hEDS)is the most common form of EDS. Accounts for 35% of EDS. -Autosomal dominant transmission and the exact genetic mechanism is not known (mutation not identified).   -Features include skin hyperextensibility, smooth velvety skin, atrophic scarring, musculoskeletal complications, family history. Other symptoms include epistaxis (fragility of mucus membrane), dislocations of joints and strains. Some patients can have chronic pelvic or abdominal pain.  -Cardiovascular complications including mitral/tricuspid valve prolapse and aortic root dilations have been reported. I recommended echocardiography in this pregnancy. Patient had normal findings on echocardiography performed in 2019 and no structural heart disease was seen.  -Overall, pregnancies in patients with hEDS are associated with good outcomes. -Cervical insufficiency reported with Classical EDS is not commonly seen. Routine prophylactic cerclage is not necessary. Our patient had a term vaginal delivery and she is currently 42 weeks' pregnant.  -Preterm premature rupture of membranes (PPROM), preterm delivery and fetal growth restriction are not increased but reported in some case studies. On today's ultrasound, she had reassuring normal fetal growth.  -Precipitate labor can occur and lead to extensive perineal tear. Episiotomy during delivery prevents extensive tears and should be considered.  -Positioning of patients (especially after epidural) to avoid excess abduction is important to avoid hip  dislocation.  -Postpartum hemorrhage is common in women with EDS and blood should be available at short notice after delivery.  -Wound healing  can be delayed in patients with EDS. Our patient had wound complications after cesarean delivery. She is keen on VBAC that should be encouraged.  -Anesthesia: Some patients may be resistant to local anesthetic and the dosage may have to be increased. Regional anesthesia can also have reduced efficacy. Patients with temporomandibular joint dislocations (or potential) can have problems during intubation. Consultation with anesthesiologist before delivery is helpful.   -About 10% to 15% can have uterine, bladder or rectal prolapse after delivery.  Postural Orthostatic Tachycardia Syndrome (POTS) In POTS, the postural tachycardia is accompanied by symptoms of cerebral hypoperfusion, sympathetic hyperactivity and other symptoms.  -More common in young women who experience light-headedness, dizziness (subacute 1-3 months), presyncope episodes.  Pathophysiology: Volume dysregulation, hyperadrenergic states/excessive sympathoexcitatory responses, impaired sympathetic vasoconstriction in the lower extremities, physical deconditioning.  Diagnostic features: -Heart rate increase of 30 beats/min or more for adults and 40 lbp for 60-77 years old, within 10 min of standing or head-up tilt in the absence of orthostatic hypotension. In pregnancy, POTS remained stable or improved in about 50% of the pregnancies. After delivery at 6 months, POTS worsened in about 50% of cases.   In one study, a small increase in early miscarriages was reported. Hyperemesis  gravidarum seems to be increased in these women. Preterm delivery and preeclampsia were not increased. Vaginal deliveries did not increase maternal adverse outcomes, and cesarean section should be performed only for obstetric indications.  Medications, if indicated should be chosen accordingly in pregnancy. Some of  the common medications are fludricortisone, and beta blockers can be safely given.  Management: Four principles: 1) Patient education, 2) nonpharmacologic volume expansion with salt and water replacement, 3) Pharmacologic therapy (mineralocorticoids, cholinergics, beta blockers) and 4) physical conditioning/physical counter maneuvers.  Previous cesarean delivery Patient is keen on VBAC. We discussed VBAC and its low complication rate of uterine scar rupture (1%). Successful VBAC is expected in more than 70% of cases. Patient was counseled that repeat cesarean deliveries increase the risk of placenta previa or accreta.  Recommendations: -Fetal growth assessment every 4 weeks. -Maternal echocardiography to evaluate aortic root size. -VBAC counseling. -Precipitate labor to be anticipated (previous pregnancy was not complicated by precipitate labor). -Episiotomy to be considered at delivery to avoid extensive perineal tear. -Anesthesiology consultation.  Thank you for consultation. Please contact me if you have any questions. Consultation including face-to-face counseling 50 minutes.

## 2020-05-18 DIAGNOSIS — Z419 Encounter for procedure for purposes other than remedying health state, unspecified: Secondary | ICD-10-CM | POA: Diagnosis not present

## 2020-05-23 ENCOUNTER — Other Ambulatory Visit (HOSPITAL_COMMUNITY): Payer: Self-pay | Admitting: Obstetrics and Gynecology

## 2020-05-23 DIAGNOSIS — Q796 Ehlers-Danlos syndrome, unspecified: Secondary | ICD-10-CM

## 2020-05-28 ENCOUNTER — Ambulatory Visit (HOSPITAL_COMMUNITY): Admission: RE | Admit: 2020-05-28 | Payer: 59 | Source: Ambulatory Visit

## 2020-06-18 DIAGNOSIS — Z419 Encounter for procedure for purposes other than remedying health state, unspecified: Secondary | ICD-10-CM | POA: Diagnosis not present

## 2020-06-24 ENCOUNTER — Inpatient Hospital Stay (HOSPITAL_COMMUNITY)
Admission: AD | Admit: 2020-06-24 | Discharge: 2020-06-24 | Disposition: A | Discharge status: Home / Self Care | Payer: 59 | Attending: Obstetrics and Gynecology | Admitting: Obstetrics and Gynecology

## 2020-06-24 ENCOUNTER — Other Ambulatory Visit: Payer: Self-pay

## 2020-06-24 ENCOUNTER — Encounter (HOSPITAL_COMMUNITY): Payer: Self-pay | Admitting: Obstetrics and Gynecology

## 2020-06-24 DIAGNOSIS — O99891 Other specified diseases and conditions complicating pregnancy: Secondary | ICD-10-CM | POA: Diagnosis not present

## 2020-06-24 DIAGNOSIS — Q796 Ehlers-Danlos syndrome, unspecified: Secondary | ICD-10-CM

## 2020-06-24 DIAGNOSIS — O99353 Diseases of the nervous system complicating pregnancy, third trimester: Secondary | ICD-10-CM | POA: Insufficient documentation

## 2020-06-24 DIAGNOSIS — Z3A36 36 weeks gestation of pregnancy: Secondary | ICD-10-CM | POA: Insufficient documentation

## 2020-06-24 DIAGNOSIS — G43009 Migraine without aura, not intractable, without status migrainosus: Secondary | ICD-10-CM | POA: Insufficient documentation

## 2020-06-24 LAB — COMPREHENSIVE METABOLIC PANEL
ALT: 11 U/L (ref 0–44)
AST: 14 U/L — ABNORMAL LOW (ref 15–41)
Albumin: 2.6 g/dL — ABNORMAL LOW (ref 3.5–5.0)
Alkaline Phosphatase: 113 U/L (ref 38–126)
Anion gap: 10 (ref 5–15)
BUN: <5 mg/dL — ABNORMAL LOW (ref 6–20)
CO2: 19 mmol/L — ABNORMAL LOW (ref 22–32)
Calcium: 8.7 mg/dL — ABNORMAL LOW (ref 8.9–10.3)
Chloride: 106 mmol/L (ref 98–111)
Creatinine, Ser: 0.4 mg/dL — ABNORMAL LOW (ref 0.44–1.00)
GFR, Estimated: >60 mL/min (ref >60)
Glucose, Bld: 79 mg/dL (ref 70–99)
Potassium: 3.7 mmol/L (ref 3.5–5.1)
Sodium: 135 mmol/L (ref 135–145)
Total Bilirubin: 0.7 mg/dL (ref 0.3–1.2)
Total Protein: 6.2 g/dL — ABNORMAL LOW (ref 6.5–8.1)

## 2020-06-24 LAB — URINALYSIS, ROUTINE W REFLEX MICROSCOPIC
Bilirubin Urine: NEGATIVE
Glucose, UA: NEGATIVE mg/dL
Hgb urine dipstick: NEGATIVE
Ketones, ur: NEGATIVE mg/dL
Nitrite: NEGATIVE
Protein, ur: NEGATIVE mg/dL
Specific Gravity, Urine: 1.01 (ref 1.005–1.030)
pH: 6 (ref 5.0–8.0)

## 2020-06-24 LAB — CBC
HCT: 27.4 % — ABNORMAL LOW (ref 36.0–46.0)
Hemoglobin: 9.3 g/dL — ABNORMAL LOW (ref 12.0–15.0)
MCH: 28.5 pg (ref 26.0–34.0)
MCHC: 33.9 g/dL (ref 30.0–36.0)
MCV: 84 fL (ref 80.0–100.0)
Platelets: 312 10*3/uL (ref 150–400)
RBC: 3.26 MIL/uL — ABNORMAL LOW (ref 3.87–5.11)
RDW: 13.5 % (ref 11.5–15.5)
WBC: 9.2 10*3/uL (ref 4.0–10.5)
nRBC: 0 % (ref 0.0–0.2)

## 2020-06-24 MED ORDER — DEXAMETHASONE SODIUM PHOSPHATE 10 MG/ML IJ SOLN
10.0000 mg | Freq: Once | INTRAMUSCULAR | Status: AC
  Administered 2020-06-24: 10 mg via INTRAVENOUS
  Filled 2020-06-24: qty 1

## 2020-06-24 MED ORDER — METOCLOPRAMIDE HCL 5 MG/ML IJ SOLN
10.0000 mg | Freq: Once | INTRAMUSCULAR | Status: AC
  Administered 2020-06-24: 10 mg via INTRAVENOUS
  Filled 2020-06-24: qty 2

## 2020-06-24 MED ORDER — LACTATED RINGERS IV BOLUS
1000.0000 mL | Freq: Once | INTRAVENOUS | Status: AC
  Administered 2020-06-24: 1000 mL via INTRAVENOUS

## 2020-06-24 MED ORDER — ACETAMINOPHEN 500 MG PO TABS
1000.0000 mg | ORAL_TABLET | Freq: Once | ORAL | Status: AC
  Administered 2020-06-24: 1000 mg via ORAL
  Filled 2020-06-24: qty 2

## 2020-06-24 MED ORDER — DIPHENHYDRAMINE HCL 50 MG/ML IJ SOLN
25.0000 mg | Freq: Once | INTRAMUSCULAR | Status: AC
  Administered 2020-06-24: 25 mg via INTRAVENOUS
  Filled 2020-06-24: qty 1

## 2020-06-24 NOTE — MAU Note (Signed)
Cassidy Myers is a 25 y.o. at [redacted]w[redacted]d here in MAU reporting: migraine for over 24 hours. Was at the office this AM and they told her to come over if she started seeing spots. She is now seeing spots in her left eye and she feels off. Has taken reglan and ibuprofen for pain.   Onset of complaint: ongoing  Pain score: 9/10  Vitals:   06/24/20 1218  BP: 128/74  Pulse: 80  Resp: 18  Temp: 98.3 F (36.8 C)  SpO2: 97%     FHT: +FM  Lab orders placed from triage: UA

## 2020-06-24 NOTE — MAU Provider Note (Addendum)
History     CSN: [id number]  Arrival date and time: 06/24/20 1154   Event Date/Time   First Provider Initiated Contact with Patient 06/24/20 1254      Chief Complaint  Patient presents with  . Headache   HPI This is a 25yo G2P1001 at [redacted]w[redacted]d with a pregnancy complicated by EDS. She has a history of headaches, but has never had migraines before. She presents with a migraine that started approximately 24 hours ago. Headache on the right side of her head, accompanied with phonophobia and photophobia. She took some tylenol yesterday and ibuprofen today without improvement. No other palliating or provoking factors.   She did see some flashing lights in her vision earlier today. Due to her symptoms, she came here for evaluation.  OB History    Gravida  2   Para  1   Term  1   Preterm      AB      Living  1     SAB      IAB      Ectopic      Multiple  0   Live Births  1           Past Medical History:  Diagnosis Date  . Acute cystitis without hematuria 11/24/2019  . ADHD   . Allergy   . Anemia   . Anxiety   . Depression   . Dysautonomia (HCC)   . Ehlers-Danlos syndrome   . Headache(784.0)   . Postpartum hematoma   . Vaginal Pap smear, abnormal     Past Surgical History:  Procedure Laterality Date  . CESAREAN SECTION N/A 11/08/2017   Procedure: CESAREAN SECTION;  Surgeon: Jaymes Graff, MD;  Location: [facility];  Service: Obstetrics;  Laterality: N/A;  . NO PAST SURGERIES    . WISDOM TOOTH EXTRACTION      Family History  Problem Relation Age of Onset  . Cancer Mother        Cervical  . Asthma Mother   . Depression Mother   . Depression Father   . Schizophrenia Father   . Clotting disorder Father   . Depression Maternal Grandmother   . Depression Maternal Grandfather   . Depression Paternal Grandmother   . Diabetes Paternal Grandmother   . Depression Paternal Grandfather   . Diabetes Paternal Grandfather     Social History    Tobacco Use  . Smoking status: Never Smoker  . Smokeless tobacco: Never Used  Vaping Use  . Vaping Use: Never used  Substance Use Topics  . Alcohol use: Yes    Comment: socially  . Drug use: No    Allergies:  Allergies  Allergen Reactions  . Latex Itching and Other (See Comments)    Redness.  . Sulfa Antibiotics Nausea And Vomiting  . Shellfish Allergy Nausea Only and Rash    Medications Prior to Admission  Medication Sig Dispense Refill Last Dose  . cyclobenzaprine (FLEXERIL) 5 MG tablet Take 1 tablet (5 mg total) by mouth 3 (three) times daily as needed for muscle spasms. 30 tablet 1 06/24/2020 at 0000  . ibuprofen (ADVIL) 200 MG tablet Take 800 mg by mouth every 6 (six) hours as needed for headache.   06/24/2020 at 0700  . Prenatal Vit-Fe Fumarate-FA (PRENATAL VITAMINS PO) Take 1 tablet by mouth daily.   06/23/2020 at Unknown time  . acetaminophen (TYLENOL) 500 MG tablet Take 500 mg by mouth every 6 (six) hours as needed. 1-2 tablets     .  ELDERBERRY PO Take by mouth daily.      . ondansetron (ZOFRAN-ODT) 4 MG disintegrating tablet as needed.   More than a month at Unknown time    Review of Systems  All other systems reviewed and are negative.  Physical Exam   Blood pressure 128/74, pulse 80, temperature 98.3 F (36.8 C), temperature source Oral, resp. rate 18, height 5\' 4"  (1.626 m), weight 98.7 kg, last menstrual period 10/13/2019, SpO2 97 %.  Physical Exam Vitals and nursing note reviewed.  Constitutional:      Appearance: She is well-developed.  HENT:     Head: Normocephalic and atraumatic.  Eyes:     General: No visual field deficit.    Extraocular Movements: Extraocular movements intact.     Pupils: Pupils are equal, round, and reactive to light.  Cardiovascular:     Rate and Rhythm: Normal rate and regular rhythm.  Pulmonary:     Effort: Pulmonary effort is normal.     Breath sounds: Normal breath sounds.  Skin:    General: Skin is warm and dry.   Neurological:     Mental Status: She is alert.     Cranial Nerves: No cranial nerve deficit, dysarthria or facial asymmetry.     Sensory: No sensory deficit.     Motor: No weakness.     Deep Tendon Reflexes: Reflexes normal.  Psychiatric:        Mood and Affect: Mood normal. Mood is not anxious or depressed.        Speech: Speech normal.        Behavior: Behavior normal. Behavior is not agitated.        Cognition and Memory: Cognition is not impaired.    Results for orders placed or performed during the hospital encounter of 06/24/20 (from the past 24 hour(s))  Urinalysis, Routine w reflex microscopic Urine, Clean Catch     Status: Abnormal   Collection Time: 06/24/20 12:40 PM  Result Value Ref Range   Color, Urine YELLOW YELLOW   APPearance HAZY (A) CLEAR   Specific Gravity, Urine 1.010 1.005 - 1.030   pH 6.0 5.0 - 8.0   Glucose, UA NEGATIVE NEGATIVE mg/dL   Hgb urine dipstick NEGATIVE NEGATIVE   Bilirubin Urine NEGATIVE NEGATIVE   Ketones, ur NEGATIVE NEGATIVE mg/dL   Protein, ur NEGATIVE NEGATIVE mg/dL   Nitrite NEGATIVE NEGATIVE   Leukocytes,Ua LARGE (A) NEGATIVE   RBC / HPF 0-5 0 - 5 RBC/hpf   WBC, UA 21-50 0 - 5 WBC/hpf   Bacteria, UA MANY (A) NONE SEEN   Squamous Epithelial / LPF 0-5 0 - 5   Mucus PRESENT   Comprehensive metabolic panel     Status: Abnormal   Collection Time: 06/24/20  1:21 PM  Result Value Ref Range   Sodium 135 135 - 145 mmol/L   Potassium 3.7 3.5 - 5.1 mmol/L   Chloride 106 98 - 111 mmol/L   CO2 19 (L) 22 - 32 mmol/L   Glucose, Bld 79 70 - 99 mg/dL   BUN <5 (L) 6 - 20 mg/dL   Creatinine, Ser 08/22/20 (L) 0.44 - 1.00 mg/dL   Calcium 8.7 (L) 8.9 - 10.3 mg/dL   Total Protein 6.2 (L) 6.5 - 8.1 g/dL   Albumin 2.6 (L) 3.5 - 5.0 g/dL   AST 14 (L) 15 - 41 U/L   ALT 11 0 - 44 U/L   Alkaline Phosphatase 113 38 - 126 U/L   Total Bilirubin 0.7 0.3 -  1.2 mg/dL   GFR, Estimated >32 >35 mL/min   Anion gap 10 5 - 15  CBC     Status: Abnormal   Collection  Time: 06/24/20  1:21 PM  Result Value Ref Range   WBC 9.2 4.0 - 10.5 K/uL   RBC 3.26 (L) 3.87 - 5.11 MIL/uL   Hemoglobin 9.3 (L) 12.0 - 15.0 g/dL   HCT 57.3 (L) 22.0 - 25.4 %   MCV 84.0 80.0 - 100.0 fL   MCH 28.5 26.0 - 34.0 pg   MCHC 33.9 30.0 - 36.0 g/dL   RDW 27.0 62.3 - 76.2 %   Platelets 312 150 - 400 K/uL   nRBC 0.0 0.0 - 0.2 %     MAU Course  Procedures NST: 130s, moderate variability, + accels, no decels.  MDM Headache cocktail given (benadryl, reglan, decadron).  Reassessed at 1500: improved, pain about 3-4. Feels like normal headache now. Will give tylenol 1000mg .  Reassessed at 1550: headache resolved. No photophobia.  BP normal, labs normal - preeclampsia ruled out. Headache improved with medications, less likely PRES.   Assessment and Plan  1. [redacted] weeks gestation of pregnancy  2. Migraine without aura and without status migrainosus, not intractable  3. Ehlers-Danlos syndrome  Preeclampsia ruled out Migraine resolved. Return precautions given.  06/24/2020, 2:10 PM

## 2020-06-24 NOTE — Discharge Instructions (Signed)

## 2020-06-26 ENCOUNTER — Ambulatory Visit (INDEPENDENT_AMBULATORY_CARE_PROVIDER_SITE_OTHER): Payer: 59 | Admitting: Neurology

## 2020-06-26 ENCOUNTER — Encounter: Payer: Self-pay | Admitting: Neurology

## 2020-06-26 VITALS — BP 118/70 | HR 94 | Ht 64.0 in | Wt 220.0 lb

## 2020-06-26 DIAGNOSIS — R519 Headache, unspecified: Secondary | ICD-10-CM

## 2020-06-26 DIAGNOSIS — O26893 Other specified pregnancy related conditions, third trimester: Secondary | ICD-10-CM

## 2020-06-26 DIAGNOSIS — G43019 Migraine without aura, intractable, without status migrainosus: Secondary | ICD-10-CM | POA: Diagnosis not present

## 2020-06-26 LAB — OB RESULTS CONSOLE GBS: GBS: NEGATIVE

## 2020-06-26 NOTE — Progress Notes (Signed)
Subjective:    Patient ID: Cassidy Myers is a 25 y.o. female.  HPI     Cassidy Foley, MD, PhD [facility] [address], [address] [address] [place], [place] [address]  Dear Dr. Elon Spanner,   I saw your patient, Cassidy Myers, upon your kind request, in my neurologic clinic today for initial consultation of her headaches, concern for new onset migraines.  The patient is unaccompanied today.  As you know, Cassidy Myers is a 25 year old right-handed woman with an underlying medical history of allergies, anemia, dysautonomia, Ehlers-Danlos syndrome, ADHD, currently pregnant in the third trimester (36 w), who reports new onset migrainous headaches since last month.  She had Covid in the beginning of January, thankfully without major complications, did not have a fever or cough or severe headache at the time, she reports that she had weight loss and low blood pressure with the COVID.  She did not have to be hospitalized. She has been vaccinated.  She started having a new left-sided throbbing headache since approximately mid January.  It is often associated with nausea and she has had tingling in her left hand.  She has not had any sustained numbness.  She has not had any double vision or loss of vision but has had floaters and some blurry vision with the headache.  The headache may come and go every few days. She drinks caffeine in limitation, 1 cup of coffee per day and very occasional soda.  She tries to hydrate well and rest well.  She does not work.  She lives with her family which includes her fianc, her 78-1/2-year-old daughter, her parents and her siblings.  For symptomatic treatment of her headaches she is currently on cyclobenzaprine, 5 mg 3 times daily, Fioricet twice daily as needed, metoclopramide 5 mg twice daily as needed, ondansetron 4 mg strength as needed, oxycodone 5 mg strength 1 tablet every 4 hours as needed.  She has picked up the Fioricet prescription but has  not actually used it. Oxycodone as needed helps reduce the headache.  She has had intermittent nausea, she is trying to hydrate well and try to eat smaller portions at a time. She denies any one-sided weakness or numbness or tingling or droopy face or slurring of speech, she does not typically have any migraines before January, had no migraines with her first pregnancy. She has a 104-1/2-year-old daughter.  She does have a family history of migraines, mom had migraines since age 34 or 39.   [facility] with a right-sided headache associated with phonophobia and photophobia and reported no improvement with over-the-counter use of Tylenol and ibuprofen.  She had some visual disturbance with flashing lights earlier in the day.  She was treated symptomatically with Benadryl, Reglan and Decadron.  She improved.  She has never had a brain MRI.   She had a head CT without contrast on 06/12/2017 and I reviewed the results: IMPRESSION: No evidence of traumatic intracranial injury or fracture.  Her Past Medical History Is Significant For: Past Medical History:  Diagnosis Date  . Acute cystitis without hematuria 11/24/2019  . ADHD   . Allergy   . Anemia   . Anxiety   . Depression   . Dysautonomia (HCC)   . Ehlers-Danlos syndrome   . Headache(784.0)   . Postpartum hematoma   . Vaginal Pap smear, abnormal     Her Past Surgical History Is Significant For: Past Surgical History:  Procedure Laterality Date  . CESAREAN SECTION N/A 11/08/2017   Procedure: CESAREAN SECTION;  Surgeon: Jaymes Graff, MD;  Location: [facility];  Service: Obstetrics;  Laterality: N/A;  . NO PAST SURGERIES    . WISDOM TOOTH EXTRACTION      Her Family History Is Significant For: Family History  Problem Relation Age of Onset  . Cancer Mother        Cervical  . Asthma Mother   . Depression Mother   . Depression Father   . Schizophrenia Father   .  Clotting disorder Father   . Depression Maternal Grandmother   . Depression Maternal Grandfather   . Depression Paternal Grandmother   . Diabetes Paternal Grandmother   . Depression Paternal Grandfather   . Diabetes Paternal Grandfather     Her Social History Is Significant For: Social History   Socioeconomic History  . Marital status: Single    Spouse name: Not on file  . Number of children: Not on file  . Years of education: 58  . Highest education level: Not on file  Occupational History  . Occupation: Consulting civil engineer   Tobacco Use  . Smoking status: Never Smoker  . Smokeless tobacco: Never Used  Vaping Use  . Vaping Use: Never used  Substance and Sexual Activity  . Alcohol use: Yes    Comment: socially  . Drug use: No  . Sexual activity: Not Currently  Other Topics Concern  . Not on file  Social History Narrative   Regular exercise-yes   Caffeine Use-yes   Social Determinants of Health   Financial Resource Strain: Not on file  Food Insecurity: Not on file  Transportation Needs: Not on file  Physical Activity: Not on file  Stress: Not on file  Social Connections: Not on file    Her Allergies Are:  Allergies  Allergen Reactions  . Latex Itching and Other (See Comments)    Redness.  . Sulfa Antibiotics Nausea And Vomiting  . Shellfish Allergy Nausea Only and Rash  :   Her Current Medications Are:  Outpatient Encounter Medications as of 06/26/2020  Medication Sig  . acetaminophen (TYLENOL) 500 MG tablet Take 500 mg by mouth every 6 (six) hours as needed. 1-2 tablets  . cyclobenzaprine (FLEXERIL) 5 MG tablet Take 1 tablet (5 mg total) by mouth 3 (three) times daily as needed for muscle spasms.  Marland Kitchen ELDERBERRY PO Take by mouth daily.   . ondansetron (ZOFRAN-ODT) 4 MG disintegrating tablet as needed.  Marland Kitchen oxycodone (OXY-IR) 5 MG capsule Take 5 mg by mouth every 4 (four) hours as needed.  . Prenatal Vit-Fe Fumarate-FA (PRENATAL VITAMINS PO) Take 1 tablet by mouth daily.    No facility-administered encounter medications on file as of 06/26/2020.  :   Review of Systems:  Out of a complete 14 point review of systems, all are reviewed and negative with the exception of these symptoms as listed below:  Review of Systems  Neurological:       Here for consult on migraines.  Patient reports being diagnosed with Covid beginning of January.  Reports 10 days after being diagnosed she noticed an increase in her headaches.  Over time the sensitivity and pressure from the headaches have increased and are debilitating at times.  She typically takes Tylenol and Flexeril she reports no benefit from these 2 medications.  Her OB/GYN has started her on a small dose of oxycodone she reports this helps dull the pain but does not take it completely  away.  She reports some associated vision changes and numbness when her headaches are at her worst.  She is currently [redacted] weeks pregnant with a baby girl.    Objective:  Neurological Exam  Physical Exam Physical Examination:   Vitals:   06/26/20 1510  BP: 118/70  Pulse: 94  SpO2: 97%   General Examination: The patient is a very pleasant 25 y.o. female in no acute distress. She appears well-developed and well-nourished and well groomed.   HEENT: Normocephalic, atraumatic, pupils are equal, round and reactive to light and accommodation. Funduscopic exam is normal with sharp disc margins noted. Extraocular tracking is good without limitation to gaze excursion or nystagmus noted. Normal smooth pursuit is noted. Hearing is grossly intact. Face is symmetric with normal facial animation and normal facial sensation. Speech is clear with no dysarthria noted. There is no hypophonia. There is no lip, neck/head, jaw or voice tremor. Neck is supple with full range of passive and active motion. There are no carotid bruits on auscultation. Oropharynx exam reveals: mild mouth dryness, good dental hygiene. Tongue protrudes centrally and palate elevates  symmetrically.   Chest: Clear to auscultation without wheezing, rhonchi or crackles noted.  Heart: S1+S2+0, regular and normal without murmurs, rubs or gallops noted.   Abdomen: Soft, non-tender and non-distended, except pregnant belly.   Extremities: There is no pitting edema in the distal lower extremities bilaterally. Pedal pulses are intact.  Skin: Warm and dry without trophic changes noted.  Musculoskeletal: exam reveals no obvious joint deformities, tenderness or joint swelling or erythema.   Neurologically:  Mental status: The patient is awake, alert and oriented in all 4 spheres. Her immediate and remote memory, attention, language skills and fund of knowledge are appropriate. There is no evidence of aphasia, agnosia, apraxia or anomia. Speech is clear with normal prosody and enunciation. Thought process is linear. Mood is normal and affect is normal.  Cranial nerves II - XII are as described above under HEENT exam. In addition: shoulder shrug is normal with equal shoulder height noted. Motor exam: Normal bulk, strength and tone is noted. There is no drift, tremor or rebound. Romberg is negative. Reflexes are 2+ throughout. Babinski: Toes are flexor bilaterally. Fine motor skills and coordination: intact with normal finger taps, normal hand movements, normal rapid alternating patting, normal foot taps and normal foot agility.  Cerebellar testing: No dysmetria or intention tremor on finger to nose testing. Heel to shin is unremarkable bilaterally. There is no truncal or gait ataxia.  Sensory exam: intact to light touch, vibration, temperature sense in the upper and lower extremities.  Gait, station and balance: She stands easily. No veering to one side is noted. No leaning to one side is noted. Posture is age-appropriate and stance is narrow based. Gait shows normal stride length and normal pace. No problems turning are noted.   Assessment and Plan:   In summary, Cassidy Myers is a  very pleasant 25 y.o.-year old female with an underlying medical history of allergies, anemia, dysautonomia, Ehlers-Danlos syndrome, ADHD, currently pregnant in the third trimester (36 w), who presents for evaluation of her new onset headaches.  She does not typically have migraines but presentation is very typical for migraines, she may very well have developed new onset migraines during pregnancy.  The majority of women with migraines actually have improvement in the third trimester.  She does have a family history of migraines. Nevertheless, there are other structural causes that can cause new onset headaches during pregnancy and  it is important that a secondary cause for headaches be ruled out.  This differential diagnosis list is long, can include vascular anomalies, PRES, intracranial hemorrhage, pseudotumor cerebri to name a few.  Thankfully, her exam is nonfocal.  She does have a regular eye exam typically and on funduscopic exam she does not have any abnormalities today.  She is advised to keep monitoring her symptoms, try to stay well-hydrated and well rested and utilize her medications with caution, she has a prescription for Fioricet and oxycodone and has also use Tylenol as needed as well as a muscle relaxer.  I did not suggest any new medications from my end of things but I would like to proceed with a brain MRI without contrast.  We will call her with her test results and if there is any finding that should be followed up with a contrasted MRI we will talk to her about it.  She is encouraged to discuss with you my recommendation to proceed with a MRI scan.  She is willing to proceed and would like to make sure that there is no structural cause for her symptoms.  I will request a brain MRI without contrast on an urgent basis since she is third trimester pregnant.  We will call to schedule this and also with the results.  If all goes well and the findings are benign on her scan, we can see her in  follow-up in this clinic on an as-needed basis.  I answered all her questions today and she was in agreement with the plan.   Thank you very much for allowing me to participate in the care of this nice patient. If I can be of any further assistance to you please do not hesitate to call me at [phone].  Sincerely,   Cassidy Foley, MD, PhD

## 2020-06-26 NOTE — Patient Instructions (Signed)
It was nice to meet you today.  Your neurological exam is normal.  Nevertheless, since you have had new onset migraine headaches during your third trimester pregnancy, I think it is important that a structural cause be ruled out.  To that end, I recommend that we proceed with a brain MRI without contrast.  Sometimes a developmental blood vessel anomaly can show up or a cluster of blood vessels, sometimes evidence of blood pressure related changes in the brain.  If there is any abnormality on your brain MRI, we will call you and see what we need to do for follow-up care.  From the symptomatic treatment standpoint, you are already prescribed medications that can be used during pregnancy for headaches.  Use the oxycodone sparingly as well as the Fioricet that you have been prescribed.  You can also use Tylenol as needed.  Try to hydrate well and try to eat at regular intervals even if it is a little bit at a time.  If all goes well and the MRI looks benign, we will keep you posted by phone call and we can see you in follow-up in this clinic on an as-needed basis.   Please check with your OB if they are okay with me ordering a brain MRI, if there are any concerns from their standpoint, let me know.

## 2020-06-27 ENCOUNTER — Telehealth: Payer: Self-pay | Admitting: Neurology

## 2020-06-27 NOTE — Telephone Encounter (Signed)
[place] Nail: [id number] (exp. 06/27/20 to 09/25/20)  medicaid [employer] pending faxed notes I did tell them in was urgent.  Also, I had to do [facility] because her Medicaid plan is not in network with [facility] and we don't take Medicaid at the office for MRI's.

## 2020-07-01 NOTE — Telephone Encounter (Signed)
Checked status on Rad MD and it is still pending.

## 2020-07-01 NOTE — Telephone Encounter (Signed)
I also called [facility] at [phone] and spoke with Darl Pikes L and she informed the clinical notes were received it is pending. Ref # for the call was [id number].

## 2020-07-03 NOTE — Telephone Encounter (Signed)
[employer] Berkley Harvey: [id number] (exp. 06/27/20 to 08/26/20) [place] Nail: [id number] (exp. 06/27/20 to 09/25/20)   order faxed to [facility] to be scheduled as soon as possible. [facility] is the place that was in network with both of the patient insurances

## 2020-07-08 ENCOUNTER — Other Ambulatory Visit: Payer: Self-pay

## 2020-07-08 ENCOUNTER — Inpatient Hospital Stay (HOSPITAL_COMMUNITY)
Admission: AD | Admit: 2020-07-08 | Discharge: 2020-07-08 | Disposition: A | Discharge status: Home / Self Care | Payer: 59 | Attending: Obstetrics and Gynecology | Admitting: Obstetrics and Gynecology

## 2020-07-08 ENCOUNTER — Encounter (HOSPITAL_COMMUNITY): Payer: Self-pay | Admitting: Obstetrics and Gynecology

## 2020-07-08 DIAGNOSIS — Z3A38 38 weeks gestation of pregnancy: Secondary | ICD-10-CM | POA: Insufficient documentation

## 2020-07-08 DIAGNOSIS — O471 False labor at or after 37 completed weeks of gestation: Secondary | ICD-10-CM

## 2020-07-08 DIAGNOSIS — O479 False labor, unspecified: Secondary | ICD-10-CM

## 2020-07-08 DIAGNOSIS — Z3689 Encounter for other specified antenatal screening: Secondary | ICD-10-CM

## 2020-07-08 DIAGNOSIS — O26893 Other specified pregnancy related conditions, third trimester: Secondary | ICD-10-CM | POA: Diagnosis present

## 2020-07-08 LAB — URINALYSIS, ROUTINE W REFLEX MICROSCOPIC
Bilirubin Urine: NEGATIVE
Glucose, UA: NEGATIVE mg/dL
Ketones, ur: NEGATIVE mg/dL
Nitrite: POSITIVE — AB
Protein, ur: NEGATIVE mg/dL
Specific Gravity, Urine: 1.013 (ref 1.005–1.030)
WBC, UA: >50 WBC/hpf — ABNORMAL HIGH (ref 0–5)
pH: 6 (ref 5.0–8.0)

## 2020-07-08 LAB — POCT FERN TEST: POCT Fern Test: NEGATIVE

## 2020-07-08 LAB — AMNISURE RUPTURE OF MEMBRANE (ROM) NOT AT ARMC: Amnisure ROM: NEGATIVE

## 2020-07-08 NOTE — MAU Provider Note (Signed)
History   [id number]   Chief Complaint  Patient presents with  . Back Pain  . Contractions  . Rupture of Membranes  . Decreased Fetal Movement    HPI Cassidy Myers is a 25 y.o. female  G2P1001 @38 .3 wks here with report of leaking clear fluid since 1600. Leaking of fluid has continued. Denies gush of fluid. Pt reports regular contractions, although unsure of frequency. She denies vaginal bleeding. Last intercourse was not recent. She reports +fetal movement. All other systems negative.    Patient's last menstrual period was 10/13/2019.  OB History  Gravida Para Term Preterm AB Living  2 1 1     1   SAB IAB Ectopic Multiple Live Births        0 1    # Outcome Date GA Lbr Len/2nd Weight Sex Delivery Anes PTL Lv  2 Current           1 Term 11/08/17 [redacted]w[redacted]d  3555 g F CS-LTranv EPI  LIV    Past Medical History:  Diagnosis Date  . Acute cystitis without hematuria 11/24/2019  . ADHD   . Allergy   . Anemia   . Anxiety   . Depression   . Dysautonomia (HCC)   . Ehlers-Danlos syndrome   . Headache(784.0)   . Postpartum hematoma   . Vaginal Pap smear, abnormal     Family History  Problem Relation Age of Onset  . Cancer Mother        Cervical  . Asthma Mother   . Depression Mother   . Depression Father   . Schizophrenia Father   . Clotting disorder Father   . Depression Maternal Grandmother   . Depression Maternal Grandfather   . Depression Paternal Grandmother   . Diabetes Paternal Grandmother   . Depression Paternal Grandfather   . Diabetes Paternal Grandfather     Social History   Socioeconomic History  . Marital status: Single    Spouse name: Not on file  . Number of children: Not on file  . Years of education: 63  . Highest education level: Not on file  Occupational History  . Occupation: 01/25/2020   Tobacco Use  . Smoking status: Never Smoker  . Smokeless tobacco: Never Used  Vaping Use  . Vaping Use: Never used  Substance and Sexual Activity  .  Alcohol use: Not Currently    Comment: socially  . Drug use: No  . Sexual activity: Not Currently  Other Topics Concern  . Not on file  Social History Narrative   Regular exercise-yes   Caffeine Use-yes   Social Determinants of Health   Financial Resource Strain: Not on file  Food Insecurity: Not on file  Transportation Needs: Not on file  Physical Activity: Not on file  Stress: Not on file  Social Connections: Not on file    Allergies  Allergen Reactions  . Latex Itching and Other (See Comments)    Redness.  . Sulfa Antibiotics Nausea And Vomiting  . Shellfish Allergy Nausea Only and Rash    No current facility-administered medications on file prior to encounter.   Current Outpatient Medications on File Prior to Encounter  Medication Sig Dispense Refill  . ELDERBERRY PO Take by mouth daily.     8 oxycodone (OXY-IR) 5 MG capsule Take 5 mg by mouth every 4 (four) hours as needed.    . Prenatal Vit-Fe Fumarate-FA (PRENATAL VITAMINS PO) Take 1 tablet by mouth daily.    Consulting civil engineer acetaminophen (TYLENOL) 500  MG tablet Take 500 mg by mouth every 6 (six) hours as needed. 1-2 tablets    . cyclobenzaprine (FLEXERIL) 5 MG tablet Take 1 tablet (5 mg total) by mouth 3 (three) times daily as needed for muscle spasms. 30 tablet 1  . ondansetron (ZOFRAN-ODT) 4 MG disintegrating tablet as needed.       Review of Systems  Gastrointestinal: Positive for abdominal pain (ctx).  Genitourinary: Positive for vaginal discharge. Negative for dysuria, frequency, hematuria, urgency and vaginal bleeding.  Musculoskeletal: Positive for back pain.     Physical Exam   Vitals:   07/08/20 1917 07/08/20 1934  BP: 118/75 120/79  Pulse: (!) 113 (!) 130  Resp: 16   Temp: 98.6 F (37 C)   SpO2: 100%   Weight: 97.5 kg   Height: 5\' 4"  (1.626 m)     Physical Exam Vitals and nursing note reviewed. Exam conducted with a chaperone present.  Constitutional:      General: She is not in acute distress.     Appearance: Normal appearance.  HENT:     Head: Normocephalic and atraumatic.  Pulmonary:     Effort: Pulmonary effort is normal. No respiratory distress.  Genitourinary:    Comments: SSE: no pool, fern neg SVE: closed thick Musculoskeletal:     Cervical back: Normal range of motion.  Skin:    General: Skin is warm and dry.  Neurological:     General: No focal deficit present.     Mental Status: She is alert and oriented to person, place, and time.  Psychiatric:        Mood and Affect: Mood normal.        Behavior: Behavior normal.   EFM: 150 bpm, mod variability, + accels, no decels Toco: rare  Results for orders placed or performed during the hospital encounter of 07/08/20 (from the past 24 hour(s))  Urinalysis, Routine w reflex microscopic Urine, Clean Catch     Status: Abnormal   Collection Time: 07/08/20  7:23 PM  Result Value Ref Range   Color, Urine YELLOW YELLOW   APPearance CLOUDY (A) CLEAR   Specific Gravity, Urine 1.013 1.005 - 1.030   pH 6.0 5.0 - 8.0   Glucose, UA NEGATIVE NEGATIVE mg/dL   Hgb urine dipstick SMALL (A) NEGATIVE   Bilirubin Urine NEGATIVE NEGATIVE   Ketones, ur NEGATIVE NEGATIVE mg/dL   Protein, ur NEGATIVE NEGATIVE mg/dL   Nitrite POSITIVE (A) NEGATIVE   Leukocytes,Ua LARGE (A) NEGATIVE   RBC / HPF 0-5 0 - 5 RBC/hpf   WBC, UA >50 (H) 0 - 5 WBC/hpf   Bacteria, UA MANY (A) NONE SEEN   Squamous Epithelial / LPF 6-10 0 - 5   Non Squamous Epithelial 0-5 (A) NONE SEEN  Amnisure rupture of membrane (rom)not at [facility]     Status: None   Collection Time: 07/08/20  8:36 PM  Result Value Ref Range   Amnisure ROM NEGATIVE   POCT fern test     Status: Normal   Collection Time: 07/08/20  8:51 PM  Result Value Ref Range   POCT Fern Test Negative = intact amniotic membranes    MAU Course  Procedures  MDM Prenatal records reviewed: hx of ehlers danlos, previous CS-planning TOLAC, PPD, anxiety, POTTS, Rh negative. No signs of SROM or labor. UA with  bacteria, nitrites, and leuks, denies urinary sx, will send culture. Stable for discharge home.   Assessment and Plan   1. [redacted] weeks gestation of pregnancy  2. NST (non-stress test) reactive   3. False labor    Discharge home Follow up at [facility] this week as scheduled Labor precautions  Allergies as of 07/08/2020      Reactions   Latex Itching, Other (See Comments)   Redness.   Sulfa Antibiotics Nausea And Vomiting   Shellfish Allergy Nausea Only, Rash      Medication List    STOP taking these medications   oxycodone 5 MG capsule Commonly known as: OXY-IR     TAKE these medications   acetaminophen 500 MG tablet Commonly known as: TYLENOL Take 500 mg by mouth every 6 (six) hours as needed. 1-2 tablets   cyclobenzaprine 5 MG tablet Commonly known as: FLEXERIL Take 1 tablet (5 mg total) by mouth 3 (three) times daily as needed for muscle spasms.   ELDERBERRY PO Take by mouth daily.   ondansetron 4 MG disintegrating tablet Commonly known as: ZOFRAN-ODT as needed.   PRENATAL VITAMINS PO Take 1 tablet by mouth daily.       Donette Larry, CNM 07/08/2020 9:00 PM

## 2020-07-08 NOTE — Telephone Encounter (Signed)
Carollee Herter message me stated "I have tried to reach this patient multiple times and left voicemails. She has not returned our phone calls."

## 2020-07-08 NOTE — MAU Note (Signed)
Pt reports vomiting, chills, fever since last yesterday morning. At home Covid test negative. Contractions since yesterday, lower back pain that hurts more than the contractions. ? Leaking fluid since 3 pm. States she has been taking tylenol, last dose at 5 pm but temp continues to go up (does not have a thermometer) . Also reports decreased fetal movement the last few days

## 2020-07-08 NOTE — Discharge Instructions (Signed)

## 2020-07-11 ENCOUNTER — Telehealth: Payer: Self-pay | Admitting: Certified Nurse Midwife

## 2020-07-11 DIAGNOSIS — O2343 Unspecified infection of urinary tract in pregnancy, third trimester: Secondary | ICD-10-CM

## 2020-07-11 LAB — CULTURE, OB URINE: Culture: >=100000 — AB

## 2020-07-11 MED ORDER — CEPHALEXIN 500 MG PO CAPS: 500.0000 mg | ORAL_CAPSULE | Freq: Four times a day (QID) | ORAL | 0 refills | Status: AC

## 2020-07-11 NOTE — Telephone Encounter (Signed)
Pt notified of +UTI, Rx sent.

## 2020-07-16 ENCOUNTER — Ambulatory Visit (HOSPITAL_COMMUNITY): Admission: RE | Admit: 2020-07-16 | Payer: 59 | Source: Ambulatory Visit

## 2020-07-16 DIAGNOSIS — Z419 Encounter for procedure for purposes other than remedying health state, unspecified: Secondary | ICD-10-CM | POA: Diagnosis not present

## 2020-07-18 ENCOUNTER — Telehealth (HOSPITAL_COMMUNITY): Payer: Self-pay | Admitting: *Deleted

## 2020-07-18 NOTE — Telephone Encounter (Signed)
Preadmission screen  

## 2020-07-19 ENCOUNTER — Encounter (HOSPITAL_COMMUNITY): Payer: Self-pay | Admitting: *Deleted

## 2020-07-19 ENCOUNTER — Telehealth (HOSPITAL_COMMUNITY): Payer: Self-pay | Admitting: *Deleted

## 2020-07-19 ENCOUNTER — Inpatient Hospital Stay (HOSPITAL_COMMUNITY): Admit: 2020-07-19 | Payer: Self-pay

## 2020-07-19 NOTE — Telephone Encounter (Signed)
Preadmission screen  

## 2020-07-22 ENCOUNTER — Other Ambulatory Visit: Admission: RE | Admit: 2020-07-22 | Payer: 59 | Source: Ambulatory Visit

## 2020-07-22 ENCOUNTER — Other Ambulatory Visit (HOSPITAL_COMMUNITY): Payer: 59

## 2020-07-23 ENCOUNTER — Inpatient Hospital Stay (HOSPITAL_COMMUNITY)
Admission: AD | Admit: 2020-07-23 | Discharge: 2020-07-27 | DRG: 788 | Disposition: A | Discharge status: Home / Self Care | Payer: 59 | Attending: Obstetrics & Gynecology | Admitting: Obstetrics & Gynecology

## 2020-07-23 ENCOUNTER — Encounter (HOSPITAL_COMMUNITY): Payer: Self-pay | Admitting: Obstetrics and Gynecology

## 2020-07-23 ENCOUNTER — Other Ambulatory Visit: Payer: Self-pay

## 2020-07-23 ENCOUNTER — Inpatient Hospital Stay (HOSPITAL_COMMUNITY): Payer: 59 | Admitting: Anesthesiology

## 2020-07-23 ENCOUNTER — Inpatient Hospital Stay (HOSPITAL_COMMUNITY): Payer: 59

## 2020-07-23 DIAGNOSIS — Z3490 Encounter for supervision of normal pregnancy, unspecified, unspecified trimester: Secondary | ICD-10-CM

## 2020-07-23 DIAGNOSIS — Z20822 Contact with and (suspected) exposure to covid-19: Secondary | ICD-10-CM | POA: Diagnosis present

## 2020-07-23 DIAGNOSIS — O480 Post-term pregnancy: Secondary | ICD-10-CM | POA: Diagnosis present

## 2020-07-23 DIAGNOSIS — O9902 Anemia complicating childbirth: Secondary | ICD-10-CM | POA: Diagnosis present

## 2020-07-23 DIAGNOSIS — D649 Anemia, unspecified: Secondary | ICD-10-CM | POA: Diagnosis present

## 2020-07-23 DIAGNOSIS — Z3A40 40 weeks gestation of pregnancy: Secondary | ICD-10-CM | POA: Diagnosis not present

## 2020-07-23 DIAGNOSIS — O34211 Maternal care for low transverse scar from previous cesarean delivery: Secondary | ICD-10-CM | POA: Diagnosis present

## 2020-07-23 LAB — RESP PANEL BY RT-PCR (FLU A&B, COVID) ARPGX2
Influenza A by PCR: NEGATIVE
Influenza B by PCR: NEGATIVE
SARS Coronavirus 2 by RT PCR: NEGATIVE

## 2020-07-23 LAB — CBC
HCT: 28.1 % — ABNORMAL LOW (ref 36.0–46.0)
Hemoglobin: 9 g/dL — ABNORMAL LOW (ref 12.0–15.0)
MCH: 25.7 pg — ABNORMAL LOW (ref 26.0–34.0)
MCHC: 32 g/dL (ref 30.0–36.0)
MCV: 80.3 fL (ref 80.0–100.0)
Platelets: 327 10*3/uL (ref 150–400)
RBC: 3.5 MIL/uL — ABNORMAL LOW (ref 3.87–5.11)
RDW: 14 % (ref 11.5–15.5)
WBC: 8.3 10*3/uL (ref 4.0–10.5)
nRBC: 0 % (ref 0.0–0.2)

## 2020-07-23 LAB — PREPARE RBC (CROSSMATCH)

## 2020-07-23 MED ORDER — DIPHENHYDRAMINE HCL 50 MG/ML IJ SOLN: 12.5000 mg | INTRAMUSCULAR | Status: DC | PRN

## 2020-07-23 MED ORDER — OXYTOCIN-SODIUM CHLORIDE 30-0.9 UT/500ML-% IV SOLN
1.0000 m[IU]/min | INTRAVENOUS | Status: DC
  Administered 2020-07-23: 2 m[IU]/min via INTRAVENOUS
  Administered 2020-07-24: 14 m[IU]/min via INTRAVENOUS

## 2020-07-23 MED ORDER — LIDOCAINE HCL (PF) 1 % IJ SOLN
INTRAMUSCULAR | Status: DC | PRN
  Administered 2020-07-23: 5 mL via EPIDURAL

## 2020-07-23 MED ORDER — EPHEDRINE 5 MG/ML INJ: 10.0000 mg | INTRAVENOUS | Status: DC | PRN

## 2020-07-23 MED ORDER — ACETAMINOPHEN 325 MG PO TABS
650.0000 mg | ORAL_TABLET | ORAL | Status: DC | PRN
  Administered 2020-07-23 – 2020-07-24 (×2): 650 mg via ORAL
  Filled 2020-07-23 (×2): qty 2

## 2020-07-23 MED ORDER — OXYTOCIN BOLUS FROM INFUSION: 333.0000 mL | Freq: Once | INTRAVENOUS | Status: DC

## 2020-07-23 MED ORDER — HYDROXYZINE HCL 50 MG PO TABS
50.0000 mg | ORAL_TABLET | Freq: Four times a day (QID) | ORAL | Status: DC | PRN
Start: 2020-07-23 — End: 2020-07-24

## 2020-07-23 MED ORDER — FENTANYL-BUPIVACAINE-NACL 0.5-0.125-0.9 MG/250ML-% EP SOLN
EPIDURAL | Status: DC | PRN
  Administered 2020-07-23: 12 mL/h via EPIDURAL

## 2020-07-23 MED ORDER — LACTATED RINGERS IV SOLN: 500.0000 mL | INTRAVENOUS | Status: DC | PRN

## 2020-07-23 MED ORDER — SODIUM CHLORIDE 0.9% IV SOLUTION: Freq: Once | INTRAVENOUS | Status: DC

## 2020-07-23 MED ORDER — PHENYLEPHRINE 40 MCG/ML (10ML) SYRINGE FOR IV PUSH (FOR BLOOD PRESSURE SUPPORT): 80.0000 ug | PREFILLED_SYRINGE | INTRAVENOUS | Status: DC | PRN

## 2020-07-23 MED ORDER — OXYCODONE-ACETAMINOPHEN 5-325 MG PO TABS: 1.0000 | ORAL_TABLET | ORAL | Status: DC | PRN

## 2020-07-23 MED ORDER — OXYTOCIN-SODIUM CHLORIDE 30-0.9 UT/500ML-% IV SOLN
2.5000 [IU]/h | INTRAVENOUS | Status: DC
  Filled 2020-07-23: qty 500

## 2020-07-23 MED ORDER — SOD CITRATE-CITRIC ACID 500-334 MG/5ML PO SOLN
30.0000 mL | ORAL | Status: DC | PRN
  Administered 2020-07-24: 30 mL via ORAL
  Filled 2020-07-23: qty 15

## 2020-07-23 MED ORDER — TERBUTALINE SULFATE 1 MG/ML IJ SOLN: 0.2500 mg | Freq: Once | INTRAMUSCULAR | Status: DC | PRN

## 2020-07-23 MED ORDER — FENTANYL CITRATE (PF) 100 MCG/2ML IJ SOLN
100.0000 ug | INTRAMUSCULAR | Status: DC | PRN
  Administered 2020-07-23: 100 ug via INTRAVENOUS
  Filled 2020-07-23: qty 2

## 2020-07-23 MED ORDER — BUTORPHANOL TARTRATE 1 MG/ML IJ SOLN
1.0000 mg | INTRAMUSCULAR | Status: DC | PRN
Start: 2020-07-23 — End: 2020-07-24

## 2020-07-23 MED ORDER — FENTANYL-BUPIVACAINE-NACL 0.5-0.125-0.9 MG/250ML-% EP SOLN
12.0000 mL/h | EPIDURAL | Status: DC | PRN
  Filled 2020-07-23: qty 250

## 2020-07-23 MED ORDER — LACTATED RINGERS IV SOLN: INTRAVENOUS | Status: DC

## 2020-07-23 MED ORDER — LACTATED RINGERS IV SOLN: 500.0000 mL | Freq: Once | INTRAVENOUS | Status: DC

## 2020-07-23 MED ORDER — OXYCODONE-ACETAMINOPHEN 5-325 MG PO TABS: 2.0000 | ORAL_TABLET | ORAL | Status: DC | PRN

## 2020-07-23 MED ORDER — LIDOCAINE HCL (PF) 1 % IJ SOLN: 30.0000 mL | INTRAMUSCULAR | Status: DC | PRN

## 2020-07-23 MED ORDER — ONDANSETRON HCL 4 MG/2ML IJ SOLN
4.0000 mg | Freq: Four times a day (QID) | INTRAMUSCULAR | Status: DC | PRN
  Administered 2020-07-24: 4 mg via INTRAVENOUS
  Filled 2020-07-23: qty 2

## 2020-07-23 NOTE — Progress Notes (Signed)
Labor Progress Note  Intermittent runs of variable decels with contractions vs early decels with contractions since AROM. Patient with reported increased vaginal pressure and contraction strength.  Cervix now 6/70/-1.  Progression in station, contractions remain regular, persistent moderate variability. Decels are not large (20-30 beats below baseline). Positive accels.  Patient now desires epidural.  Bolus running.  Suspect tracing changes due to AROM and cervical progression.  Will continue close monitoring for signs of uterine rupture.    Nilda Simmer MD

## 2020-07-23 NOTE — Anesthesia Procedure Notes (Signed)
Epidural Patient location during procedure: OB Start time: 07/23/2020 9:59 PM End time: 07/23/2020 10:14 PM  Staffing Anesthesiologist: Trevor Iha, MD Performed: anesthesiologist   Preanesthetic Checklist Completed: patient identified, IV checked, site marked, risks and benefits discussed, surgical consent, monitors and equipment checked, pre-op evaluation and timeout performed  Epidural Patient position: sitting Prep: DuraPrep and site prepped and draped Patient monitoring: continuous pulse ox and blood pressure Approach: midline Location: L3-L4 Injection technique: LOR air  Needle:  Needle type: Tuohy  Needle gauge: 17 G Needle length: 9 cm and 9 Needle insertion depth: 7 cm Catheter type: closed end flexible Catheter size: 19 Gauge Catheter at skin depth: 12 cm Test dose: negative  Assessment Events: blood not aspirated, injection not painful, no injection resistance, no paresthesia and negative IV test  Additional Notes Patient identified. Risks/Benefits/Options discussed with patient including but not limited to bleeding, infection, nerve damage, paralysis, failed block, incomplete pain control, headache, blood pressure changes, nausea, vomiting, reactions to medication both or allergic, itching and postpartum back pain. Confirmed with bedside nurse the patient's most recent platelet count. Confirmed with patient that they are not currently taking any anticoagulation, have any bleeding history or any family history of bleeding disorders. Patient expressed understanding and wished to proceed. All questions were answered. Sterile technique was used throughout the entire procedure. Please see nursing notes for vital signs. Test dose was given through epidural needle and negative prior to continuing to dose epidural or start infusion. Warning signs of high block given to the patient including shortness of breath, tingling/numbness in hands, complete motor block, or any  concerning symptoms with instructions to call for help. Patient was given instructions on fall risk and not to get out of bed. All questions and concerns addressed with instructions to call with any issues. 1 Attempt (S) . Patient tolerated procedure well.

## 2020-07-23 NOTE — Progress Notes (Signed)
Pt states she had COVID test in January in the [facility] office that was postive. Called [facility] they have no record of having seen the pt and doinga COVID test office states they do not preform COVID test in their office. Informed pt that I spoke with the office and they never preformed a COVID test for her. Pt still states she had her nose swabbed. I informed her without proof of the result we either have to COVID test her or treat her as if shes postive. Pt agreed to the test.

## 2020-07-23 NOTE — Progress Notes (Signed)
FHT improved, epidural in place. Continued moderate variability, accels present, occasional early decels present.  Epidural in place, cervix with change to 7/80/-1.  Continue current management.

## 2020-07-23 NOTE — H&P (Addendum)
OB History and Physical   Cassidy Myers is a 25 y.o. female G2P1001 presenting for TOLAC induction of labor at [redacted]w[redacted]d.  Patient has a history of Ehler's Danlos and POTTS.  During pregnancy multiple echocardiograms were ordered but patient no-showed. She underwent echo during her last pregnancy (2019) which showed normal function. She has a history of previous cesarean delivery for failure to progress and required 2 u pRBC transfusion on PPD#1.  Patient strongly desires trial of labor. She has been counseled throughout her prenatal care as well upon presentation today. She understands risks of TOLAC including  But limited to uterine rupture. She was also provided written hospital consent for attempting vaginal birth after C section.    OB History    Gravida  2   Para  1   Term  1   Preterm      AB      Living  1     SAB      IAB      Ectopic      Multiple  0   Live Births  1          Past Medical History:  Diagnosis Date  . Acute cystitis without hematuria 11/24/2019  . ADHD   . Allergy   . Anemia   . Anxiety   . Depression   . Dysautonomia (HCC)   . Ehlers-Danlos syndrome   . Headache(784.0)   . Postpartum hematoma   . Vaginal Pap smear, abnormal    Past Surgical History:  Procedure Laterality Date  . CESAREAN SECTION N/A 11/08/2017   Procedure: CESAREAN SECTION;  Surgeon: Jaymes Graff, MD;  Location: [facility];  Service: Obstetrics;  Laterality: N/A;  . WISDOM TOOTH EXTRACTION     Family History: family history includes Asthma in her mother; Cancer in her mother; Clotting disorder in her father; Depression in her father, maternal grandfather, maternal grandmother, mother, paternal grandfather, and paternal grandmother; Diabetes in her paternal grandfather and paternal grandmother; Schizophrenia in her father. Social History:  reports that she has never smoked. She has never used smokeless tobacco. She reports previous alcohol use. She reports that she  does not use drugs.     Maternal Diabetes: No Genetic Screening: Normal Maternal Ultrasounds/Referrals: Maternal echocardiograms, patient no-showed Fetal Ultrasounds or other Referrals:  Referred to Materal Fetal Medicine  Maternal Substance Abuse:  No Significant Maternal Medications:  None Significant Maternal Lab Results:  Group B Strep negative Other Comments:  None   History   Last menstrual period 10/13/2019. Exam Physical Exam   Addendum 3/23 for incomplete H&P: Physical Exam:  Gen: alert, well appearing, no distress Chest: nonlabored breaths CV: no peripheral edema Abdomen: gravid, soft, nontender Ext: no evidence of DVT  ROS: denies fever, chills, SOB, CP, HA, vision changes, abdominal pain, leg pai, vaginal bleeding.  Prenatal labs: ABO, Rh: --/--/O NEG Performed at [facility], [address] [place], [place] [address]  [phone]) Antibody: NEG Performed at [facility], [address] [place], [place] [address]  [phone]) Rubella:   RPR:    HBsAg:    HIV:    GBS: Negative/-- (02/09 0000)   Assessment/Plan: . Admit to Labor and Delivery . Counseled regarding TOLAC induction in detail. . Will monitor baby, get labs, and attempt foley placement.  Plan for low dose pitocin, continuous monitoring, AROM when able followed by IUPC and FSE. Marland Kitchen Epidural when desired.  Counseled patient on benefit of epidural access in the  event of acute fetal distress and need for C section.   Cassidy Myers 07/23/2020, 8:00 AM

## 2020-07-23 NOTE — Progress Notes (Signed)
Patient now amenable to pitocin, foley remains in place.  FHT has been cat 1.  Will titrate 2x2 as able.  Will hold at 8 mU while foley in place.   Continue TOLAC IOL.

## 2020-07-23 NOTE — Progress Notes (Addendum)
Labor Progress Note  Admission labs consistent with ongoing anemia, HGB 9.0.  Will have 2 units prepared for history of PPH and blood transfusion with last pregnancy, PPH risk for current pregnancy. Patient states she is OK with blood transfusion when indicated.   Again discussed plan with patient for Foley placement.  Vertex confirmed on bedside ultrasound.  Foley placed, uterine balloon inflated to 60 cc. Patient tolerated well.   Patient hesitant to begin pitocin given wishes for minimal IV fluids and experience with prolonged IOL with last pregnancy.  We discussed importance and role of pitocin with induction. She would like to allow cramping from foley placement to settle and will consider starting pit.  FHT has been cat 1  All questions answered.  Start pit when able, titrate and hold at 10 while foley in place.  Hopeful for timely cervical dilation, will folllow with AROM and internal monitor placement.   All questions answered.   Nilda Simmer MD.

## 2020-07-23 NOTE — Anesthesia Preprocedure Evaluation (Addendum)
Anesthesia Evaluation  Patient identified by MRN, date of birth, ID band Patient awake    Reviewed: Allergy & Precautions, NPO status , Patient's Chart, lab work & pertinent test results  Airway Mallampati: II  TM Distance: >3 FB Neck ROM: Full    Dental no notable dental hx. (+) Teeth Intact, Dental Advisory Given   Pulmonary neg pulmonary ROS,    Pulmonary exam normal breath sounds clear to auscultation       Cardiovascular Exercise Tolerance: Good negative cardio ROS Normal cardiovascular exam Rhythm:Regular Rate:Normal  POTs syndrome   Neuro/Psych  Headaches, Anxiety Bipolar Disorder    GI/Hepatic negative GI ROS, Neg liver ROS,   Endo/Other  negative endocrine ROS  Renal/GU negative Renal ROS     Musculoskeletal negative musculoskeletal ROS (+)   Abdominal (+) + obese,   Peds  Hematology  (+) anemia , Lab Results      Component                Value               Date                      WBC                      8.3                 07/23/2020                HGB                      9.0 (L)             07/23/2020                HCT                      28.1 (L)            07/23/2020                MCV                      80.3                07/23/2020                PLT                      327                 07/23/2020             Hx of Post Partum Hemorhage  TXc for 2 units PP   Anesthesia Other Findings All Latex and Sulfa  Reproductive/Obstetrics (+) Pregnancy                             Anesthesia Physical Anesthesia Plan  ASA: III and emergent  Anesthesia Plan: Epidural   Post-op Pain Management:    Induction:   PONV Risk Score and Plan:   Airway Management Planned: Natural Airway  Additional Equipment:   Intra-op Plan:   Post-operative Plan:   Informed Consent: I have reviewed the patients History and Physical, chart, labs and discussed the procedure  including the risks, benefits and alternatives for the  proposed anesthesia with the patient or authorized representative who has indicated his/her understanding and acceptance.     Dental advisory given  Plan Discussed with: CRNA and Anesthesiologist  Anesthesia Plan Comments: (40.4 Wk G2 P1 w Hx of Ehlers Danlos and Potts Syndome, anemia and f or TOLAC  Patient for repeat C/Section for arrest of descent and non reassuring FHR tracing. Will use epidural for C/Section. M. Malen Gauze, MD)       Anesthesia Quick Evaluation

## 2020-07-23 NOTE — Progress Notes (Signed)
Labor Progress Note  At bedside to assess foley balloon.  Patient reports increased contraction pain, increased frequency.  Currently on 80mU/min of pit.  FHT has been Cat 1 since admission.  On exam, balloon no longer held in by cervix, removed, and check  4/70/-2.  Recommended AROM and placement of internals, patient agreeable.  Head well applied, AROM performed in typical fashion with return of clear fluid.  IUPC and FSE placed without difficulty.  Discussed pain mgmt and patient requesting IV pain medication.  Also discussed epidural if she desires.  1x variable vs early decel after AROM and position change.  Will monitor closely.   All questions answered, continue current management of TOLAC induction.  Nilda Simmer MD

## 2020-07-24 ENCOUNTER — Encounter (HOSPITAL_COMMUNITY)
Admission: AD | Disposition: A | Discharge status: Home / Self Care | Payer: Self-pay | Source: Home / Self Care | Attending: Obstetrics & Gynecology

## 2020-07-24 ENCOUNTER — Encounter (HOSPITAL_COMMUNITY): Payer: Self-pay | Admitting: Obstetrics and Gynecology

## 2020-07-24 LAB — RPR: RPR Ser Ql: NONREACTIVE

## 2020-07-24 SURGERY — Surgical Case
Anesthesia: Epidural

## 2020-07-24 MED ORDER — LIDOCAINE-EPINEPHRINE (PF) 2 %-1:200000 IJ SOLN
INTRAMUSCULAR | Status: DC | PRN
  Administered 2020-07-24: 15 mL via EPIDURAL
  Administered 2020-07-24: 5 mL via EPIDURAL

## 2020-07-24 MED ORDER — ONDANSETRON HCL 4 MG/2ML IJ SOLN
INTRAMUSCULAR | Status: AC
  Filled 2020-07-24: qty 2

## 2020-07-24 MED ORDER — ONDANSETRON HCL 4 MG/2ML IJ SOLN: 4.0000 mg | Freq: Once | INTRAMUSCULAR | Status: DC | PRN

## 2020-07-24 MED ORDER — TETANUS-DIPHTH-ACELL PERTUSSIS 5-2.5-18.5 LF-MCG/0.5 IM SUSY: 0.5000 mL | PREFILLED_SYRINGE | Freq: Once | INTRAMUSCULAR | Status: DC

## 2020-07-24 MED ORDER — DIPHENHYDRAMINE HCL 50 MG/ML IJ SOLN: 12.5000 mg | INTRAMUSCULAR | Status: DC | PRN

## 2020-07-24 MED ORDER — KETOROLAC TROMETHAMINE 30 MG/ML IJ SOLN
INTRAMUSCULAR | Status: AC
  Filled 2020-07-24: qty 1

## 2020-07-24 MED ORDER — SIMETHICONE 80 MG PO CHEW: 80.0000 mg | CHEWABLE_TABLET | ORAL | Status: DC | PRN

## 2020-07-24 MED ORDER — ONDANSETRON HCL 4 MG/2ML IJ SOLN
INTRAMUSCULAR | Status: DC | PRN
  Administered 2020-07-24: 4 mg via INTRAVENOUS

## 2020-07-24 MED ORDER — DIBUCAINE (PERIANAL) 1 % EX OINT: 1.0000 "application " | TOPICAL_OINTMENT | CUTANEOUS | Status: DC | PRN

## 2020-07-24 MED ORDER — ONDANSETRON HCL 4 MG/2ML IJ SOLN: 4.0000 mg | Freq: Three times a day (TID) | INTRAMUSCULAR | Status: DC | PRN

## 2020-07-24 MED ORDER — NALOXONE HCL 0.4 MG/ML IJ SOLN: 0.4000 mg | INTRAMUSCULAR | Status: DC | PRN

## 2020-07-24 MED ORDER — ZOLPIDEM TARTRATE 5 MG PO TABS: 5.0000 mg | ORAL_TABLET | Freq: Every evening | ORAL | Status: DC | PRN

## 2020-07-24 MED ORDER — ALBUMIN HUMAN 5 % IV SOLN
INTRAVENOUS | Status: AC
  Filled 2020-07-24: qty 250

## 2020-07-24 MED ORDER — FENTANYL CITRATE (PF) 100 MCG/2ML IJ SOLN: 25.0000 ug | INTRAMUSCULAR | Status: DC | PRN

## 2020-07-24 MED ORDER — CHLOROPROCAINE HCL (PF) 3 % IJ SOLN
INTRAMUSCULAR | Status: DC | PRN
  Administered 2020-07-24: 20 mL

## 2020-07-24 MED ORDER — MORPHINE SULFATE (PF) 0.5 MG/ML IJ SOLN
INTRAMUSCULAR | Status: AC
  Filled 2020-07-24: qty 10

## 2020-07-24 MED ORDER — MORPHINE SULFATE (PF) 10 MG/ML IV SOLN
INTRAVENOUS | Status: DC | PRN
  Administered 2020-07-24: 3 mg via EPIDURAL

## 2020-07-24 MED ORDER — MEPERIDINE HCL 25 MG/ML IJ SOLN
INTRAMUSCULAR | Status: DC | PRN
  Administered 2020-07-24 (×2): 12.5 mg via INTRAVENOUS

## 2020-07-24 MED ORDER — SENNOSIDES-DOCUSATE SODIUM 8.6-50 MG PO TABS
2.0000 | ORAL_TABLET | Freq: Every day | ORAL | Status: DC
  Administered 2020-07-25 – 2020-07-27 (×3): 2 via ORAL
  Filled 2020-07-24 (×3): qty 2

## 2020-07-24 MED ORDER — LACTATED RINGERS IV SOLN: INTRAVENOUS | Status: DC

## 2020-07-24 MED ORDER — PHENYLEPHRINE HCL (PRESSORS) 10 MG/ML IV SOLN
INTRAVENOUS | Status: DC | PRN
  Administered 2020-07-24: 40 ug via INTRAVENOUS
  Administered 2020-07-24: 80 ug via INTRAVENOUS

## 2020-07-24 MED ORDER — SCOPOLAMINE 1 MG/3DAYS TD PT72
MEDICATED_PATCH | TRANSDERMAL | Status: DC | PRN
  Administered 2020-07-24: 1 via TRANSDERMAL

## 2020-07-24 MED ORDER — PHENYLEPHRINE 40 MCG/ML (10ML) SYRINGE FOR IV PUSH (FOR BLOOD PRESSURE SUPPORT)
PREFILLED_SYRINGE | INTRAVENOUS | Status: AC
  Filled 2020-07-24: qty 10

## 2020-07-24 MED ORDER — NALBUPHINE HCL 10 MG/ML IJ SOLN: 5.0000 mg | INTRAMUSCULAR | Status: DC | PRN

## 2020-07-24 MED ORDER — CEFAZOLIN SODIUM-DEXTROSE 2-4 GM/100ML-% IV SOLN: 2.0000 g | Freq: Once | INTRAVENOUS | Status: DC

## 2020-07-24 MED ORDER — SODIUM CHLORIDE 0.9% FLUSH: 3.0000 mL | INTRAVENOUS | Status: DC | PRN

## 2020-07-24 MED ORDER — MEPERIDINE HCL 25 MG/ML IJ SOLN
INTRAMUSCULAR | Status: AC
  Filled 2020-07-24: qty 1

## 2020-07-24 MED ORDER — METOCLOPRAMIDE HCL 5 MG/ML IJ SOLN
INTRAMUSCULAR | Status: DC | PRN
  Administered 2020-07-24: 10 mg via INTRAVENOUS

## 2020-07-24 MED ORDER — CHLOROPROCAINE HCL (PF) 3 % IJ SOLN
INTRAMUSCULAR | Status: AC
  Filled 2020-07-24: qty 20

## 2020-07-24 MED ORDER — MEPERIDINE HCL 25 MG/ML IJ SOLN: 6.2500 mg | INTRAMUSCULAR | Status: DC | PRN

## 2020-07-24 MED ORDER — NALBUPHINE HCL 10 MG/ML IJ SOLN
5.0000 mg | Freq: Once | INTRAMUSCULAR | Status: DC | PRN
Start: 2020-07-24 — End: 2020-07-27

## 2020-07-24 MED ORDER — PRENATAL MULTIVITAMIN CH
1.0000 | ORAL_TABLET | Freq: Every day | ORAL | Status: DC
  Administered 2020-07-25 – 2020-07-26 (×2): 1 via ORAL
  Filled 2020-07-24 (×2): qty 1

## 2020-07-24 MED ORDER — NALOXONE HCL 4 MG/10ML IJ SOLN
1.0000 ug/kg/h | INTRAVENOUS | Status: DC | PRN
  Filled 2020-07-24: qty 5

## 2020-07-24 MED ORDER — DIPHENHYDRAMINE HCL 25 MG PO CAPS: 25.0000 mg | ORAL_CAPSULE | Freq: Four times a day (QID) | ORAL | Status: DC | PRN

## 2020-07-24 MED ORDER — CEFAZOLIN SODIUM-DEXTROSE 2-3 GM-%(50ML) IV SOLR
INTRAVENOUS | Status: DC | PRN
  Administered 2020-07-24: 2 g via INTRAVENOUS

## 2020-07-24 MED ORDER — DEXMEDETOMIDINE (PRECEDEX) IN NS 20 MCG/5ML (4 MCG/ML) IV SYRINGE
PREFILLED_SYRINGE | INTRAVENOUS | Status: DC | PRN
  Administered 2020-07-24: 12 ug via INTRAVENOUS

## 2020-07-24 MED ORDER — DEXAMETHASONE SODIUM PHOSPHATE 10 MG/ML IJ SOLN
INTRAMUSCULAR | Status: AC
  Filled 2020-07-24: qty 1

## 2020-07-24 MED ORDER — DIPHENHYDRAMINE HCL 25 MG PO CAPS: 25.0000 mg | ORAL_CAPSULE | ORAL | Status: DC | PRN

## 2020-07-24 MED ORDER — OXYTOCIN-SODIUM CHLORIDE 30-0.9 UT/500ML-% IV SOLN
2.5000 [IU]/h | INTRAVENOUS | Status: AC
  Administered 2020-07-24: 2.5 [IU]/h via INTRAVENOUS
  Filled 2020-07-24: qty 500

## 2020-07-24 MED ORDER — COCONUT OIL OIL
1.0000 "application " | TOPICAL_OIL | Status: DC | PRN
  Administered 2020-07-24: 1 via TOPICAL

## 2020-07-24 MED ORDER — ACETAMINOPHEN 325 MG PO TABS
650.0000 mg | ORAL_TABLET | ORAL | Status: DC | PRN
  Administered 2020-07-25 – 2020-07-27 (×5): 650 mg via ORAL
  Filled 2020-07-24 (×5): qty 2

## 2020-07-24 MED ORDER — SIMETHICONE 80 MG PO CHEW
80.0000 mg | CHEWABLE_TABLET | Freq: Three times a day (TID) | ORAL | Status: DC
  Administered 2020-07-24 – 2020-07-27 (×7): 80 mg via ORAL
  Filled 2020-07-24 (×7): qty 1

## 2020-07-24 MED ORDER — KETOROLAC TROMETHAMINE 30 MG/ML IJ SOLN: 30.0000 mg | Freq: Four times a day (QID) | INTRAMUSCULAR | Status: AC | PRN

## 2020-07-24 MED ORDER — FERROUS SULFATE 325 (65 FE) MG PO TABS
325.0000 mg | ORAL_TABLET | ORAL | Status: DC
  Administered 2020-07-26: 325 mg via ORAL
  Filled 2020-07-24: qty 1

## 2020-07-24 MED ORDER — KETOROLAC TROMETHAMINE 30 MG/ML IJ SOLN
30.0000 mg | Freq: Four times a day (QID) | INTRAMUSCULAR | Status: AC | PRN
  Administered 2020-07-24: 30 mg via INTRAVENOUS

## 2020-07-24 MED ORDER — SCOPOLAMINE 1 MG/3DAYS TD PT72
MEDICATED_PATCH | TRANSDERMAL | Status: AC
  Filled 2020-07-24: qty 1

## 2020-07-24 MED ORDER — OXYCODONE HCL 5 MG PO TABS
5.0000 mg | ORAL_TABLET | ORAL | Status: DC | PRN
  Administered 2020-07-24 – 2020-07-25 (×4): 5 mg via ORAL
  Administered 2020-07-26: 10 mg via ORAL
  Administered 2020-07-26 – 2020-07-27 (×3): 5 mg via ORAL
  Filled 2020-07-24 (×2): qty 1
  Filled 2020-07-24: qty 2
  Filled 2020-07-24 (×3): qty 1
  Filled 2020-07-24: qty 2
  Filled 2020-07-24: qty 1

## 2020-07-24 MED ORDER — FERROUS SULFATE 325 (65 FE) MG PO TABS: 325.0000 mg | ORAL_TABLET | ORAL | Status: DC

## 2020-07-24 MED ORDER — DEXMEDETOMIDINE (PRECEDEX) IN NS 20 MCG/5ML (4 MCG/ML) IV SYRINGE
PREFILLED_SYRINGE | INTRAVENOUS | Status: AC
  Filled 2020-07-24: qty 5

## 2020-07-24 MED ORDER — DEXAMETHASONE SODIUM PHOSPHATE 4 MG/ML IJ SOLN
INTRAMUSCULAR | Status: DC | PRN
  Administered 2020-07-24: 5 mg via INTRAVENOUS

## 2020-07-24 MED ORDER — NALBUPHINE HCL 10 MG/ML IJ SOLN: 5.0000 mg | Freq: Once | INTRAMUSCULAR | Status: DC | PRN

## 2020-07-24 MED ORDER — MENTHOL 3 MG MT LOZG: 1.0000 | LOZENGE | OROMUCOSAL | Status: DC | PRN

## 2020-07-24 MED ORDER — OXYTOCIN-SODIUM CHLORIDE 30-0.9 UT/500ML-% IV SOLN
INTRAVENOUS | Status: DC | PRN
  Administered 2020-07-24: 250 mL via INTRAVENOUS
  Administered 2020-07-24: 150 mL via INTRAVENOUS

## 2020-07-24 MED ORDER — ALBUMIN HUMAN 5 % IV SOLN: INTRAVENOUS | Status: DC | PRN

## 2020-07-24 MED ORDER — IBUPROFEN 800 MG PO TABS
800.0000 mg | ORAL_TABLET | Freq: Four times a day (QID) | ORAL | Status: DC
  Administered 2020-07-24 – 2020-07-27 (×11): 800 mg via ORAL
  Filled 2020-07-24 (×11): qty 1

## 2020-07-24 MED ORDER — WITCH HAZEL-GLYCERIN EX PADS: 1.0000 "application " | MEDICATED_PAD | CUTANEOUS | Status: DC | PRN

## 2020-07-24 SURGICAL SUPPLY — 35 items
BENZOIN TINCTURE PRP APPL 2/3 (GAUZE/BANDAGES/DRESSINGS) ×2 IMPLANT
CHLORAPREP W/TINT 26ML (MISCELLANEOUS) ×2 IMPLANT
CLAMP CORD UMBIL (MISCELLANEOUS) IMPLANT
CLOTH BEACON ORANGE TIMEOUT ST (SAFETY) ×2 IMPLANT
DERMABOND ADVANCED (GAUZE/BANDAGES/DRESSINGS)
DERMABOND ADVANCED .7 DNX12 (GAUZE/BANDAGES/DRESSINGS) IMPLANT
DRSG OPSITE POSTOP 4X10 (GAUZE/BANDAGES/DRESSINGS) ×2 IMPLANT
ELECT REM PT RETURN 9FT ADLT (ELECTROSURGICAL) ×2
ELECTRODE REM PT RTRN 9FT ADLT (ELECTROSURGICAL) ×1 IMPLANT
EXTRACTOR VACUUM KIWI (MISCELLANEOUS) IMPLANT
GAUZE SPONGE 4X4 12PLY STRL LF (GAUZE/BANDAGES/DRESSINGS) ×4 IMPLANT
GLOVE BIO SURGEON STRL SZ 6 (GLOVE) ×2 IMPLANT
GLOVE BIOGEL PI IND STRL 6 (GLOVE) ×2 IMPLANT
GLOVE BIOGEL PI IND STRL 7.0 (GLOVE) ×1 IMPLANT
GLOVE BIOGEL PI INDICATOR 6 (GLOVE) ×2
GLOVE BIOGEL PI INDICATOR 7.0 (GLOVE) ×1
GOWN STRL REUS W/TWL LRG LVL3 (GOWN DISPOSABLE) ×4 IMPLANT
KIT ABG SYR 3ML LUER SLIP (SYRINGE) ×2 IMPLANT
NEEDLE HYPO 25X5/8 SAFETYGLIDE (NEEDLE) ×2 IMPLANT
NS IRRIG 1000ML POUR BTL (IV SOLUTION) ×2 IMPLANT
PACK C SECTION WH (CUSTOM PROCEDURE TRAY) ×2 IMPLANT
PAD ABD DERMACEA PRESS 5X9 (GAUZE/BANDAGES/DRESSINGS) ×2 IMPLANT
PAD OB MATERNITY 4.3X12.25 (PERSONAL CARE ITEMS) ×2 IMPLANT
PENCIL SMOKE EVAC W/HOLSTER (ELECTROSURGICAL) ×2 IMPLANT
STRIP CLOSURE SKIN 1/2X4 (GAUZE/BANDAGES/DRESSINGS) IMPLANT
STRIP SURGICAL 1/2 X 6 IN (GAUZE/BANDAGES/DRESSINGS) ×2 IMPLANT
SUT CHROMIC 0 CTX 36 (SUTURE) ×6 IMPLANT
SUT MON AB 2-0 CT1 27 (SUTURE) ×2 IMPLANT
SUT PDS AB 0 CT1 27 (SUTURE) ×4 IMPLANT
SUT PLAIN 0 NONE (SUTURE) IMPLANT
SUT VIC AB 0 CT1 36 (SUTURE) IMPLANT
SUT VIC AB 4-0 KS 27 (SUTURE) IMPLANT
TOWEL OR 17X24 6PK STRL BLUE (TOWEL DISPOSABLE) ×2 IMPLANT
TRAY FOLEY W/BAG SLVR 14FR LF (SET/KITS/TRAYS/PACK) IMPLANT
WATER STERILE IRR 1000ML POUR (IV SOLUTION) ×2 IMPLANT

## 2020-07-24 NOTE — Progress Notes (Signed)
Patient continues to push effectively but has started to have deep variable declerations to 50s intermittently.  Patient is counseled that I recommend vacuum assistance vs C/S at this time given minimal descent since my first exam around 730.  Patient declines vacuum assisted delivery.  Will proceed with C/S.  Patient is counseled re: risk of bleeding, infection, scarring, and damage to surrounding structures.  She is informed of implications in future pregnancies including abnormal placentation and uterine rupture.  All questions were answered and we will proceed.  Mitchel Honour, DO

## 2020-07-24 NOTE — Progress Notes (Signed)
Overnight with steady cervical progression, now 10/100/0.  FHT continues to be overall reassuring, though contractions decreased.  Titrate pitocin as able, labor down, begin pushing when able.  Moderate variability, positive accels, occasional carrot-stick variable decels.  Continue current management.  Nilda Simmer MD

## 2020-07-24 NOTE — Anesthesia Postprocedure Evaluation (Signed)
Anesthesia Post Note  Patient: Cassidy Myers  Procedure(s) Performed: CESAREAN SECTION (N/A )     Patient location during evaluation: PACU Anesthesia Type: Epidural Level of consciousness: oriented and awake and alert Pain management: pain level controlled Vital Signs Assessment: post-procedure vital signs reviewed and stable Respiratory status: spontaneous breathing, respiratory function stable and nonlabored ventilation Cardiovascular status: blood pressure returned to baseline and stable Postop Assessment: no headache, no backache, no apparent nausea or vomiting, epidural receding and patient able to bend at knees Anesthetic complications: no   No complications documented.  Last Vitals:  Vitals:   07/24/20 1120 07/24/20 1130  BP:  107/60  Pulse:  88  Resp: 18 16  Temp:    SpO2:      Last Pain:  Vitals:   07/24/20 1115  TempSrc: Oral  PainSc:    Pain Goal:                Epidural/Spinal Function Cutaneous sensation: Able to Wiggle Toes (07/24/20 1115), Patient able to flex knees: Yes (07/24/20 1115), Patient able to lift hips off bed: No (07/24/20 1115), Back pain beyond tenderness at insertion site: No (07/24/20 1115), Progressively worsening motor and/or sensory loss: No (07/24/20 1115), Bowel and/or bladder incontinence post epidural: No (07/24/20 1115)  Johnthomas Lader A.

## 2020-07-24 NOTE — Op Note (Signed)
Cassidy Myers PROCEDURE DATE: 07/24/2020  PREOPERATIVE DIAGNOSIS: Intrauterine pregnancy at  [redacted]w[redacted]d weeks gestation, arrest of descent and non-reassuring fetal monitoring  POSTOPERATIVE DIAGNOSIS: The same  PROCEDURE:  Repeat Low Transverse Cesarean Section  SURGEON:  Dr. Mitchel Honour  INDICATIONS: Cassidy Myers is a 25 y.o. G2P1001 at [redacted]w[redacted]d scheduled for cesarean section secondary to failed TOLAC with arrest of descent and non-reassuring fetal monitoring; patient declined vacuum assisted delivery.  The risks of cesarean section discussed with the patient included but were not limited to: bleeding which may require transfusion or reoperation; infection which may require antibiotics; injury to bowel, bladder, ureters or other surrounding organs; injury to the fetus; need for additional procedures including hysterectomy in the event of a life-threatening hemorrhage; placental abnormalities wth subsequent pregnancies, incisional problems, thromboembolic phenomenon and other postoperative/anesthesia complications. The patient concurred with the proposed plan, giving informed written consent for the procedure.    FINDINGS:  Viable female infant in cephalic presentation, APGARs per NICU: weight pending, clear amniotic fluid.  Intact placenta, three vessel cord.  Grossly normal uterus, ovaries and fallopian tubes but bladder adhesions to level of mid uterine corpus. .   ANESTHESIA:    Epidural ESTIMATED BLOOD LOSS: 600 ml SPECIMENS: Placenta sent to L&D COMPLICATIONS: None immediate  PROCEDURE IN DETAIL:  The patient received intravenous antibiotics and had sequential compression devices applied to her lower extremities while in the preoperative area.  She was then taken to the operating room where epidural anesthesia was dosed up to surgical level and was found to be adequate. She was then placed in a dorsal supine position with a leftward tilt, and prepped and draped in a sterile manner.  A foley  catheter was placed into her bladder and attached to constant gravity.  After an adequate timeout was performed, a Pfannenstiel skin incision was made with scalpel and carried through to the underlying layer of fascia. The fascia was incised in the midline and this incision was extended bilaterally using the Mayo scissors. Kocher clamps were applied to the superior aspect of the fascial incision and the underlying rectus muscles were dissected off bluntly. A similar process was carried out on the inferior aspect of the facial incision. The rectus muscles were separated in the midline bluntly and the peritoneum was entered bluntly. Bladder was found to be adherent to level of mid uterine corpus.  Using sharp and blunt dissection, bladder flap was created and bladder blade was placed.  A transverse hysterotomy was made with a scalpel and extended bilaterally bluntly. The bladder blade was then removed. The infant was successfully delivered, and cord was clamped and cut and infant was handed over to awaiting neonatology team. Uterine massage was then administered and the placenta delivered intact with three-vessel cord. The uterus was cleared of clot and debris.  The hysterotomy was closed with 0 chromic.  A second imbricating suture of 0-chromic was used to reinforce the incision and aid in hemostasis.  The peritoneum and rectus muscles were noted to be hemostatic and were reapproximated using 3-0 monocryl in a running fashion.  The fascia was closed with 0-PDS in a running fashion with good restoration of anatomy.  The subcutaneus tissue was copiously irrigated.  The skin was closed with 4-0 vicryl in a subcuticular fashion.  Pt tolerated the procedure will.  All counts were correct x2.  Pt went to the recovery room in stable condition.

## 2020-07-24 NOTE — Progress Notes (Signed)
At bedside to discuss plan  Complete dilation but with persistent 0 station. Practice pushes with minimal descent. Patient has been declining up-titration of pitocin and does not feel ready to push.  Discussed overall labor course and importance of second stage of labor upcoming, including of benefit of increasing pitocin (currently 14 mU/min) and doing position changes.   Motivation and encouragement provided. Patient open to starting to push at 6:30.  Comfortable with epidural. Throughout the night patient has been concerned with IVF and fluid overload. Discussed adequate urine output and importance of maintaining hydration during prolonged labor and for pitocin infusion. Patient amenable to IVF.  Moderate variability, positive accels, carrotstick variables and occasional early decels.    All questions answered.

## 2020-07-24 NOTE — Transfer of Care (Signed)
Immediate Anesthesia Transfer of Care Note  Patient: Cassidy Myers  Procedure(s) Performed: CESAREAN SECTION (N/A )  Patient Location: PACU  Anesthesia Type:Epidural  Level of Consciousness: awake, alert  and oriented  Airway & Oxygen Therapy: Patient Spontanous Breathing  Post-op Assessment: Report given to RN and Post -op Vital signs reviewed and stable  Post vital signs: Reviewed and stable  Last Vitals:  Vitals Value Taken Time  BP    Temp    Pulse    Resp    SpO2      Last Pain:  Vitals:   07/24/20 0630  TempSrc: Oral  PainSc: 0-No pain         Complications: No complications documented.

## 2020-07-24 NOTE — Progress Notes (Signed)
Cassidy Myers is a 25 y.o. G2P1001 at [redacted]w[redacted]d by ultrasound admitted for induction of labor/TOLAC due to Post dates. Due date 07/19/20.  Subjective: Pushing since 0630; adequate anesthesia  Objective: BP (!) 103/58   Pulse 94   Temp 98.4 F (36.9 C) (Oral)   Resp 16   Ht 5\' 4"  (1.626 m)   Wt 99.7 kg   LMP 10/13/2019   SpO2 99%   BMI 37.75 kg/m  I/O last 3 completed shifts: In: -  Out: 850 [Urine:850] No intake/output data recorded.  FHT:  FHR: 155 bpm, variability: moderate,  accelerations:  Abscent,  decelerations:  Present variables with pushing UC:   regular, every 2-3 minutes SVE:   Dilation: 10 Effacement (%): 100 Station: 0 Exam by:: [email or web address], RN  Labs: Lab Results  Component Value Date   WBC 8.3 07/23/2020   HGB 9.0 (L) 07/23/2020   HCT 28.1 (L) 07/23/2020   MCV 80.3 07/23/2020   PLT 327 07/23/2020    Assessment / Plan: Second stage  Labor: Progressing normally Preeclampsia:  n/a Fetal Wellbeing:  Category II Pain Control:  Epidural I/D:  n/a Anticipated MOD:  NSVD/VBAC  Chronic anemia-2u PRBCs prepared  09/22/2020 07/24/2020, 7:48 AM

## 2020-07-25 LAB — CBC
HCT: 23.9 % — ABNORMAL LOW (ref 36.0–46.0)
Hemoglobin: 7.7 g/dL — ABNORMAL LOW (ref 12.0–15.0)
MCH: 26.5 pg (ref 26.0–34.0)
MCHC: 32.2 g/dL (ref 30.0–36.0)
MCV: 82.1 fL (ref 80.0–100.0)
Platelets: 280 10*3/uL (ref 150–400)
RBC: 2.91 MIL/uL — ABNORMAL LOW (ref 3.87–5.11)
RDW: 14.3 % (ref 11.5–15.5)
WBC: 13.4 10*3/uL — ABNORMAL HIGH (ref 4.0–10.5)
nRBC: 0 % (ref 0.0–0.2)

## 2020-07-25 LAB — KLEIHAUER-BETKE STAIN
# Vials RhIg: 1
Fetal Cells %: 0 %
Quantitation Fetal Hemoglobin: 0 mL

## 2020-07-25 MED ORDER — RHO D IMMUNE GLOBULIN 1500 UNIT/2ML IJ SOSY
300.0000 ug | PREFILLED_SYRINGE | Freq: Once | INTRAMUSCULAR | Status: AC
  Administered 2020-07-25: 300 ug via INTRAVENOUS
  Filled 2020-07-25: qty 2

## 2020-07-25 NOTE — Social Work (Signed)
CSW received consult for hx of Anxiety and Postpartum Depression.  CSW met with MOB to offer support and complete assessment.     CSW introduced self and role. CSW observed FOB present, holding newborn. CSW offered to return to speak with MOB in private. MOB declined and stated FOB could remain in room. CSW informed MOB of reason for consult. MOB was pleasant, open and engaged with CSW. MOB reported she was diagnosed with anxiety and postpartum depression about 6 weeks after having her daughter. MOB stated she was prescribed Zoloft which she took until she no longer felt it was needed. MOB reported the Zoloft was helpful while it was being utilized. Aside from FOB, MOB stated she has a strong support system which consists of her mother, father and siblings. MOB denies any current SI or HI.   CSW provided education regarding the baby blues period versus PPD and provided resources. CSW provided the New Mom Checklist and encouraged MOB to self evaluate and contact a medical professional if symptoms are noted at any time.  CSW provided review of Sudden Infant Death Syndrome (SIDS) precautions. MOB reported she has all essentials for infant, including a bedside bassinet. MOB denies any barriers to follow-up care and expressed no additional needs at this time.   CSW identifies no further need for intervention and no barriers to discharge at this time.  Paticia Moster, LCSWA Clinical Social Work [facility] [phone]  

## 2020-07-25 NOTE — Progress Notes (Signed)
Subjective: Postpartum Day 1: Cesarean Delivery Patient reports incisional pain, tolerating PO and no problems voiding.    Objective: Vital signs in last 24 hours: Temp:  [98.1 F (36.7 C)-99.6 F (37.6 C)] 98.3 F (36.8 C) (03/10 0505) Pulse Rate:  [81-108] 92 (03/10 0505) Resp:  [14-20] 16 (03/10 0505) BP: (83-114)/(48-67) 108/61 (03/10 0505) SpO2:  [96 %-98 %] 98 % (03/10 0505)  Physical Exam:  General: alert, cooperative, appears stated age and no distress Lochia: appropriate Uterine Fundus: firm Incision: bandage dry DVT Evaluation: No evidence of DVT seen on physical exam.  Recent Labs    07/23/20 0930 07/25/20 0523  HGB 9.0* 7.7*  HCT 28.1* 23.9*    Assessment/Plan: Status post Cesarean section. Doing well postoperatively.  Continue current care.  Cassidy Myers 07/25/2020, 9:50 AM

## 2020-07-26 LAB — TYPE AND SCREEN
ABO/RH(D): O NEG
Antibody Screen: NEGATIVE
Unit division: 0
Unit division: 0
Weak D: POSITIVE

## 2020-07-26 LAB — RH IG WORKUP (INCLUDES ABO/RH)
ABO/RH(D): O NEG
Gestational Age(Wks): 40.5
Unit division: 0

## 2020-07-26 LAB — BPAM RBC
Blood Product Expiration Date: 202203172359
Blood Product Expiration Date: 202203182359
Unit Type and Rh: 9500
Unit Type and Rh: 9500

## 2020-07-26 MED ORDER — IBUPROFEN 600 MG PO TABS: 600.0000 mg | ORAL_TABLET | Freq: Four times a day (QID) | ORAL | 0 refills | Status: DC | PRN

## 2020-07-26 MED ORDER — OXYCODONE HCL 5 MG PO TABS: 5.0000 mg | ORAL_TABLET | ORAL | 0 refills | Status: DC | PRN

## 2020-07-26 MED ORDER — DOCUSATE SODIUM 100 MG PO CAPS: 100.0000 mg | ORAL_CAPSULE | Freq: Two times a day (BID) | ORAL | 2 refills | Status: DC

## 2020-07-26 NOTE — Discharge Summary (Signed)
Postpartum Discharge Summary  Date of Service updated 07/26/20     Patient Name: Cassidy Myers DOB: 16-Feb-1996 MRN: [id number]  Date of admission: 07/23/2020 Delivery date:07/24/2020  Delivering provider: Linda Hedges  Date of discharge: 07/26/2020  Admitting diagnosis: Pregnancy [Z34.90] Cesarean delivery delivered [O82] Intrauterine pregnancy: [redacted]w[redacted]d    Secondary diagnosis:  Active Problems:   Pregnancy   Cesarean delivery delivered  [facility]   Discharge diagnosis: Term Pregnancy Delivered                                              Post partum procedures:rhogam Augmentation: AROM, Pitocin and IP Foley Complications: None  Hospital course: Induction of Labor With Cesarean Section   25y.o. yo G2P2002 at 436w5das admitted to the hospital 07/23/2020 for induction of labor. Patient had a labor course significant for arrest of descent. The patient went for cesarean section due to Arrest of Descent and failed VTOLAC. Delivery details are as follows: Membrane Rupture Time/Date: 7:30 PM ,07/23/2020   Delivery Method:C-Section, Low Transverse  Details of operation can be found in separate operative Note.  Patient had an uncomplicated postpartum course. She is ambulating, tolerating a regular diet, passing flatus, and urinating well.  Patient is discharged home in stable condition on 07/26/20.      Newborn Data: Birth date:07/24/2020  Birth time:9:20 AM  Gender:Female  Living status:Living  Apgars:9 ,10  Weight:3730 g                                 Magnesium Sulfate received: No BMZ received: No Rhophylac:Yes MMR:N/A T-DaP:Given prenatally Flu: N/A Transfusion:No  Physical exam  Vitals:   07/25/20 0505 07/25/20 1330 07/25/20 2126 07/26/20 0556  BP: 108/61 110/63 135/72 111/68  Pulse: 92 88 100 94  Resp: _0 Temp: 98.3 F (36.8 C) 98.6 F (37 C) 98 F (36.7 C) 98.1 F (36.7 C)  TempSrc: Oral Oral Oral Oral  SpO2: 98% 97% 99% 98%   Weight:      Height:       General: alert Lochia: appropriate Uterine Fundus: firm Incision: Healing well with no significant drainage DVT Evaluation: No evidence of DVT seen on physical exam. Labs: Lab Results  Component Value Date   WBC 13.4 (H) 07/25/2020   HGB 7.7 (L) 07/25/2020   HCT 23.9 (L) 07/25/2020   MCV 82.1 07/25/2020   PLT 280 07/25/2020   CMP Latest Ref Rng & Units 06/24/2020  Glucose 70 - 99 mg/dL 79  BUN 6 - 20 mg/dL <5(L)  Creatinine 0.44 - 1.00 mg/dL 0.40(L)  Sodium 135 - 145 mmol/L 135  Potassium 3.5 - 5.1 mmol/L 3.7  Chloride 98 - 111 mmol/L 106  CO2 22 - 32 mmol/L 19(L)  Calcium 8.9 - 10.3 mg/dL 8.7(L)  Total Protein 6.5 - 8.1 g/dL 6.2(L)  Total Bilirubin 0.3 - 1.2 mg/dL 0.7  Alkaline Phos 38 - 126 U/L 113  AST 15 - 41 U/L 14(L)  ALT 0 - 44 U/L 11   Edinburgh Score: Edinburgh Postnatal Depression Scale Screening Tool 07/24/2020  I have been able to laugh and see the funny side of things. 0  I have looked forward with enjoyment to things. 0  I have blamed myself unnecessarily when  things went wrong. 0  I have been anxious or worried for no good reason. 3  I have felt scared or panicky for no good reason. 0  Things have been getting on top of me. 0  I have been so unhappy that I have had difficulty sleeping. 0  I have felt sad or miserable. 0  I have been so unhappy that I have been crying. 0  The thought of harming myself has occurred to me. 0  Edinburgh Postnatal Depression Scale Total 3      After visit meds:  Allergies as of 07/26/2020      Reactions   Latex Itching, Other (See Comments)   Redness.   Sulfa Antibiotics Nausea And Vomiting   Shellfish Allergy Nausea Only, Rash      Medication List    STOP taking these medications   cyclobenzaprine 5 MG tablet Commonly known as: FLEXERIL     TAKE these medications   acetaminophen 500 MG tablet Commonly known as: TYLENOL Take 1,000 mg by mouth every 6 (six) hours as needed for mild  pain or headache.   docusate sodium 100 MG capsule Commonly known as: Colace Take 1 capsule (100 mg total) by mouth 2 (two) times daily.   ibuprofen 600 MG tablet Commonly known as: ADVIL Take 1 tablet (600 mg total) by mouth every 6 (six) hours as needed.   oxyCODONE 5 MG immediate release tablet Commonly known as: Oxy IR/ROXICODONE Take 1 tablet (5 mg total) by mouth every 4 (four) hours as needed for severe pain.   pantoprazole 40 MG tablet Commonly known as: PROTONIX Take 40 mg by mouth daily.   prenatal multivitamin Tabs tablet Take 1 tablet by mouth daily at 12 noon.        Discharge home in stable condition Infant Feeding: Bottle and Breast Infant Disposition:home with mother Discharge instruction: per After Visit Summary and Postpartum booklet. Activity: Advance as tolerated. Pelvic rest for 6 weeks.  Diet: routine diet Anticipated Birth Control: Unsure Postpartum Appointment:6 weeks Additional Postpartum F/U: n/a Future Appointments:No future appointments. Follow up Visit:      07/26/2020  Jennifer , MD  

## 2020-07-27 NOTE — Progress Notes (Signed)
No updates to DCS. Discharge home.

## 2020-08-16 DIAGNOSIS — Z419 Encounter for procedure for purposes other than remedying health state, unspecified: Secondary | ICD-10-CM | POA: Diagnosis not present

## 2020-09-15 DIAGNOSIS — Z419 Encounter for procedure for purposes other than remedying health state, unspecified: Secondary | ICD-10-CM | POA: Diagnosis not present

## 2020-10-16 DIAGNOSIS — Z419 Encounter for procedure for purposes other than remedying health state, unspecified: Secondary | ICD-10-CM | POA: Diagnosis not present

## 2020-10-18 IMAGING — US US OB LIMITED
1 series · 14 of 28 positions shown · non-contrast
Comparison: none

CLINICAL DATA: Vaginal bleeding.

EXAM:
LIMITED OBSTETRIC ULTRASOUND

[Series 1: ob us · 14 of 30 slices shown]
[im 2/30]
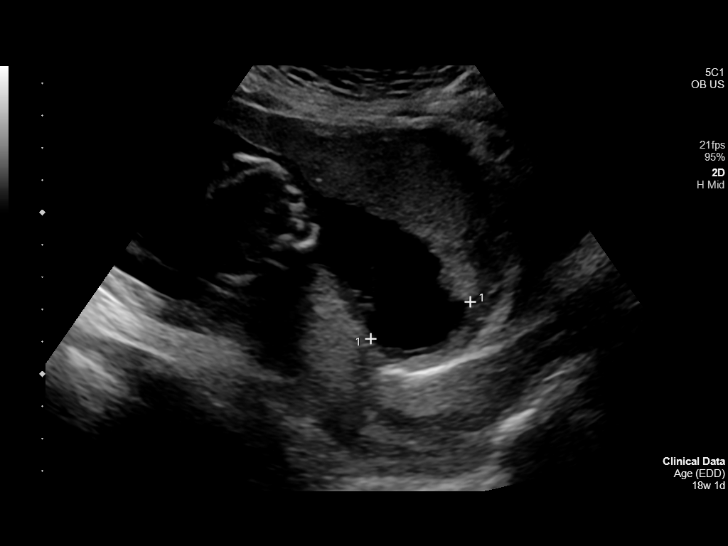
[im 4/30]
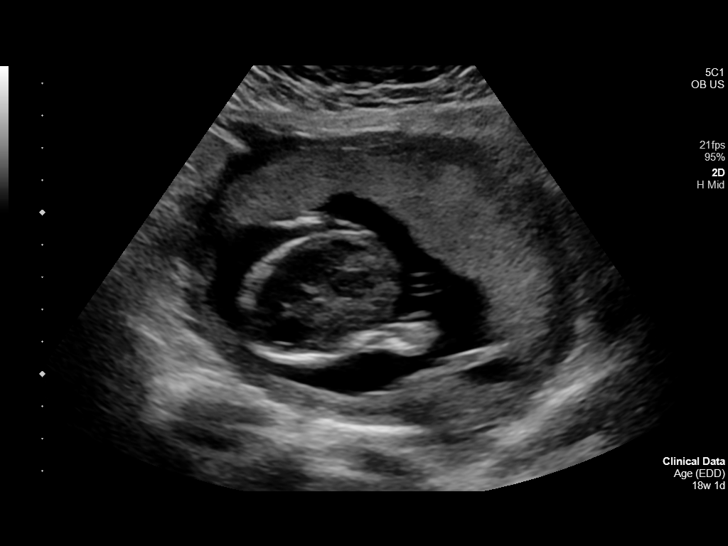
[im 6/30]
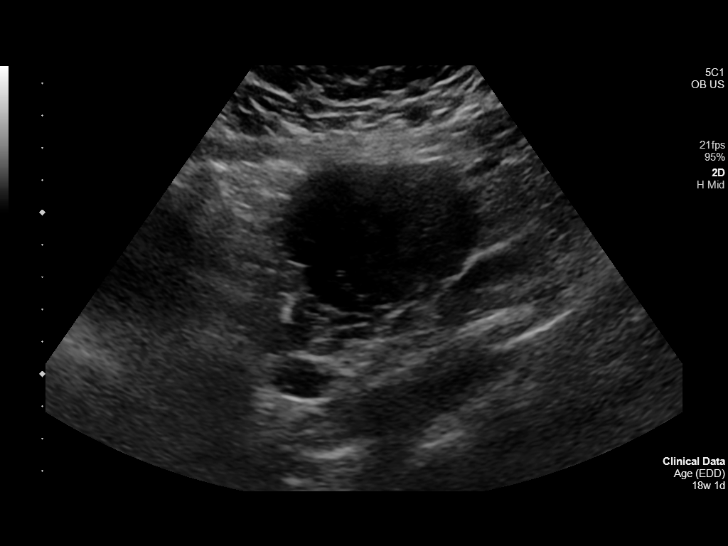
[im 8/30]
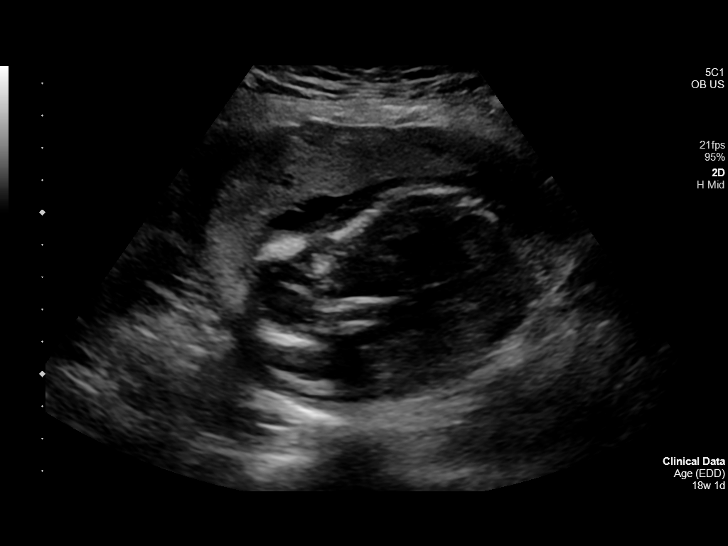
[im 10/30]
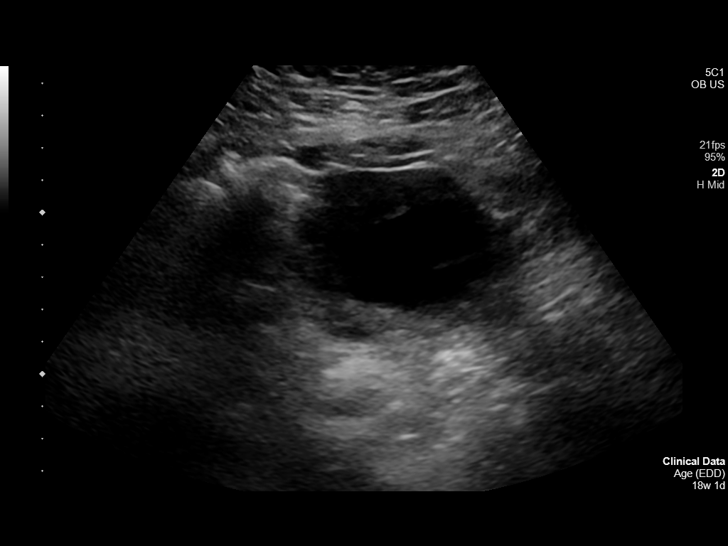
[im 12/30]
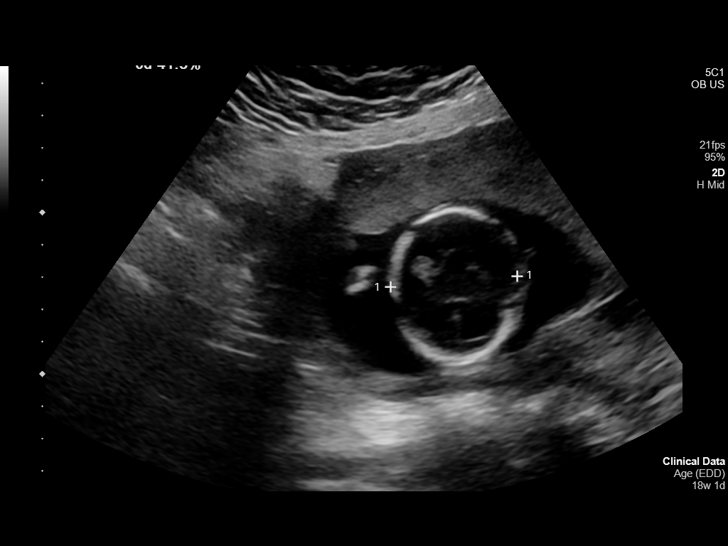
[im 14/30]
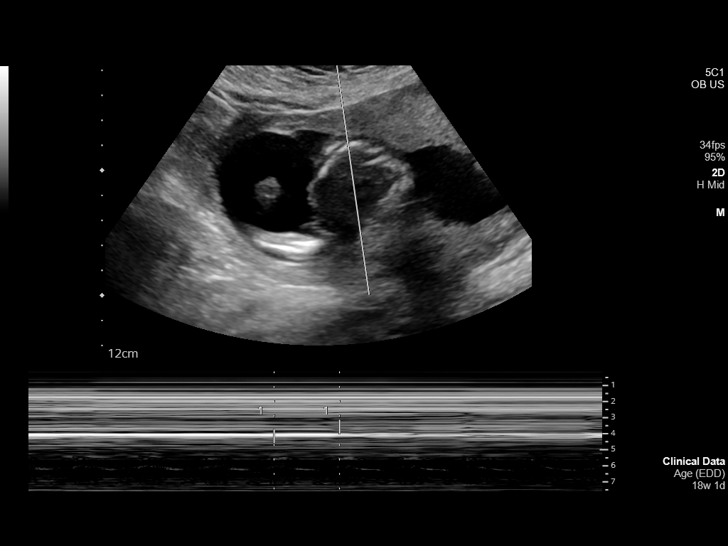
[im 17/30]
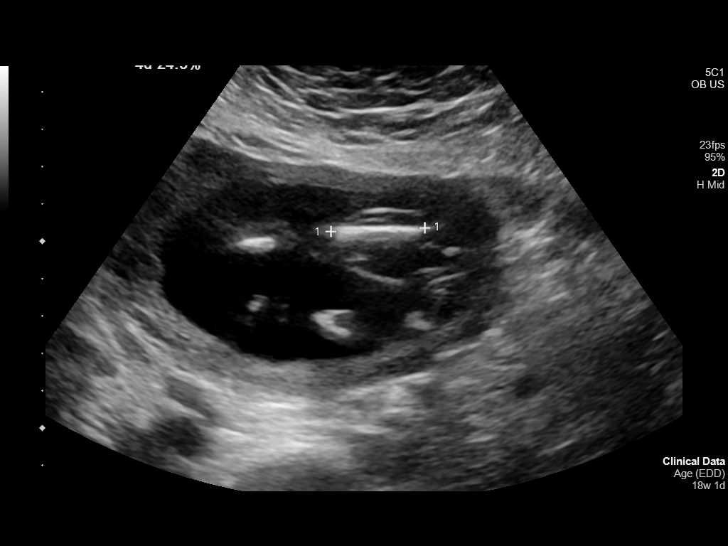
[im 19/30]
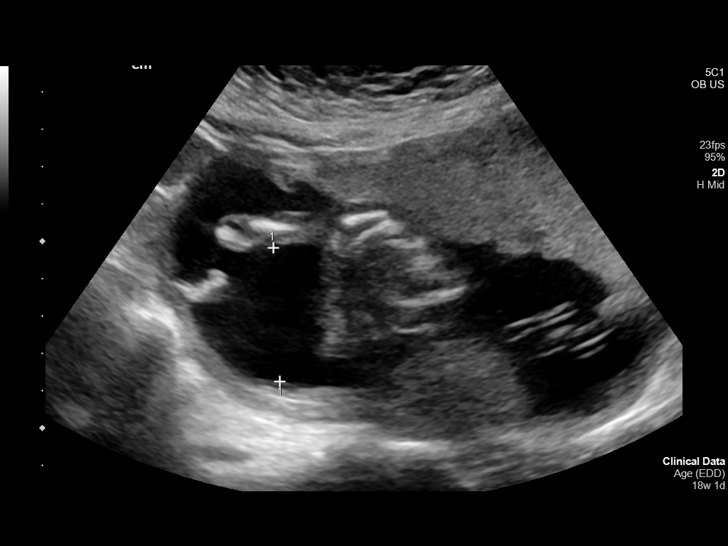
[im 21/30]
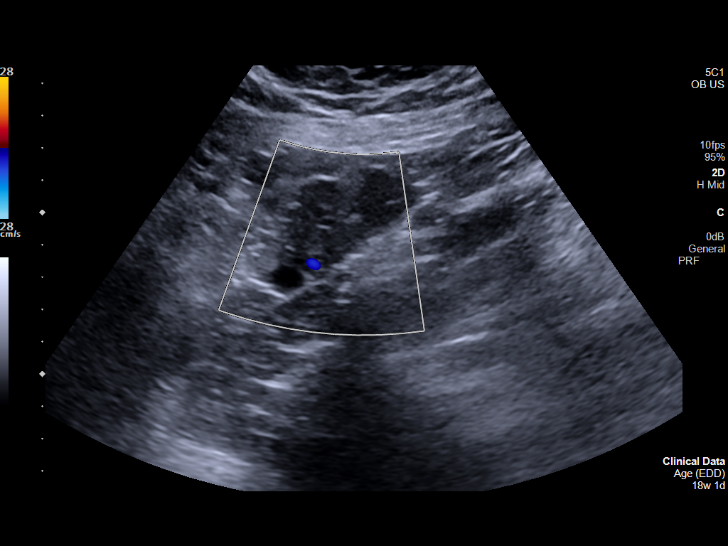
[im 23/30]
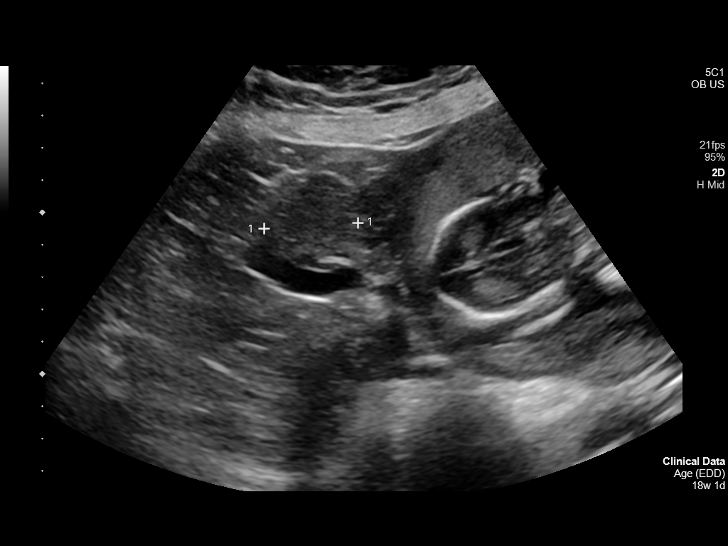
[im 25/30]
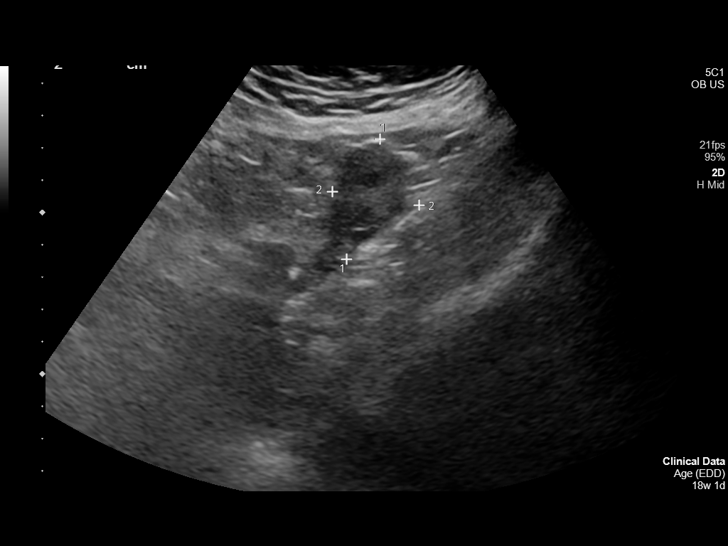
[im 27/30]
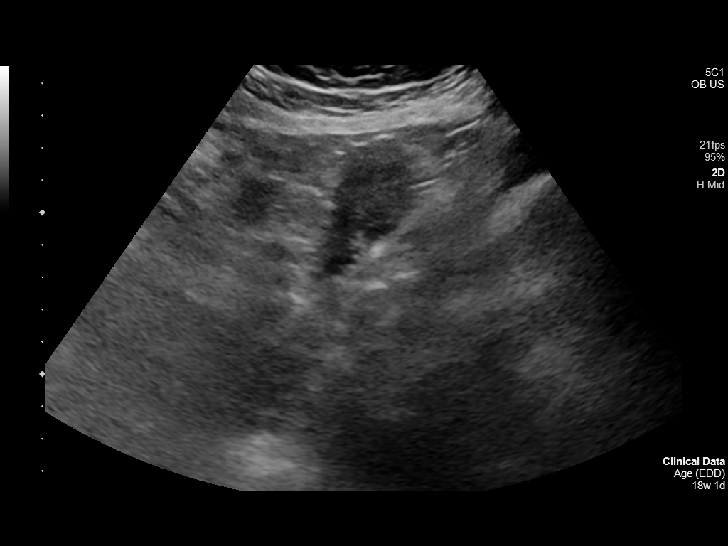
[im 30/30]
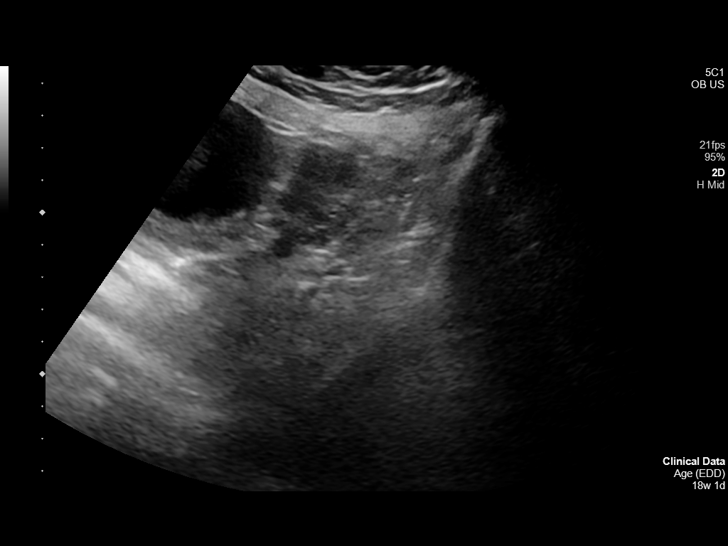

[14 of 28 positions shown; findings below may reference images not displayed]

FINDINGS: Number of Fetuses: 1

Heart Rate:  157 bpm

Movement: Yes

Presentation: Transverse

Placental Location: Anterior

Previa: No

Amniotic Fluid (Subjective):  Within normal limits.

AFI: N/A cm

BPD: 3.8 cm 17 w  4 d

MATERNAL FINDINGS:

Cervix:  Appears closed (4.8 cm in length).

Uterus/Adnexae: No abnormality visualized.
IMPRESSION: Single, viable intrauterine pregnancy at approximately 17 weeks and
4 days gestation by ultrasound evaluation.

This exam is performed on an emergent basis and does not
comprehensively evaluate fetal size, dating, or anatomy; follow-up
complete OB US should be considered if further fetal assessment is
warranted.

## 2020-10-29 ENCOUNTER — Telehealth: Payer: Self-pay | Admitting: Internal Medicine

## 2020-10-29 ENCOUNTER — Other Ambulatory Visit: Payer: Self-pay | Admitting: Internal Medicine

## 2020-10-29 DIAGNOSIS — Q796 Ehlers-Danlos syndrome, unspecified: Secondary | ICD-10-CM

## 2020-10-29 NOTE — Telephone Encounter (Signed)
I will let Dr Alphonsus Sias know. She has already been to [facility]. Notes from [facility] given back to Dr Alphonsus Sias.

## 2020-10-29 NOTE — Telephone Encounter (Signed)
Cassidy Myers, she was a [facility] pt Dr Alphonsus Sias was trying to help. [name] referred her to [facility]. 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 Looks like our group does not do it ans they directed [place] to [facility] (already done). I have the note from [facility] on my desk. Please help find the correct placement for this patient.

## 2020-10-29 NOTE — Telephone Encounter (Signed)
Mrs. Cassidy Myers called in from the hematologist and she stated that the counslor state that they only see cancer patients. And to try [facility] or [facility].    Please advise

## 2020-11-01 NOTE — Telephone Encounter (Signed)
Called to speak with patient about this before proceeding. In the chart it is noted some genetic counseling and intake information from [facility], did patient see someone through them? Does patient still need a referral? This referral was originally placed in April 2021.  Could not leave a message, phone call kept dropping. Sent mychart message to the patient.

## 2020-11-08 NOTE — Telephone Encounter (Signed)
Called patient again, her mom and her partner listed on DPR. Could only leave a message on her partner's number Nate, the other phone numbers kept dropping my call.

## 2020-11-13 NOTE — Telephone Encounter (Signed)
I have been trying to reach patient and her family for the last 2 weeks with no response. Per epic it looks like with her pregnancy there were some genetic testing done. Not sure if patient still needs to be seen by a specialist for this and I was trying to follow up. Will need to wait to hear back if still need to look into it.  FYI to Dr Anette Riedel you have any other suggestions or recommendations let me know. Thank you

## 2020-11-13 NOTE — Telephone Encounter (Signed)
noted 

## 2020-11-15 DIAGNOSIS — Z419 Encounter for procedure for purposes other than remedying health state, unspecified: Secondary | ICD-10-CM | POA: Diagnosis not present

## 2020-12-16 DIAGNOSIS — Z419 Encounter for procedure for purposes other than remedying health state, unspecified: Secondary | ICD-10-CM | POA: Diagnosis not present

## 2020-12-30 IMAGING — US US MFM OB DETAIL+14 WK
1 series · 13 of 28 positions shown · non-contrast
Comparison: none

[Series 1: us mfm ob detail+14 wk · 108 acquisitions, 13 frames shown]
[im 4/108]
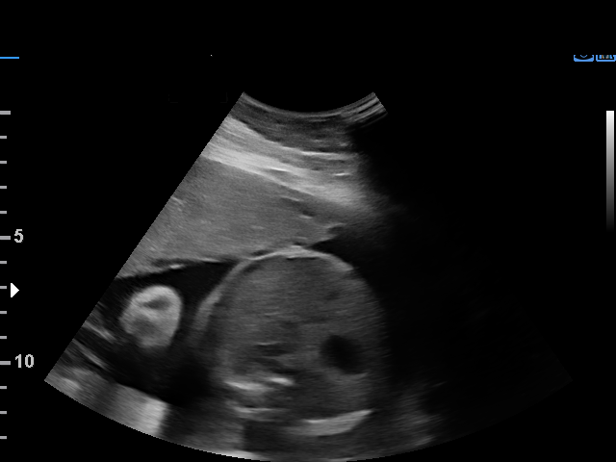
[im 12/108]
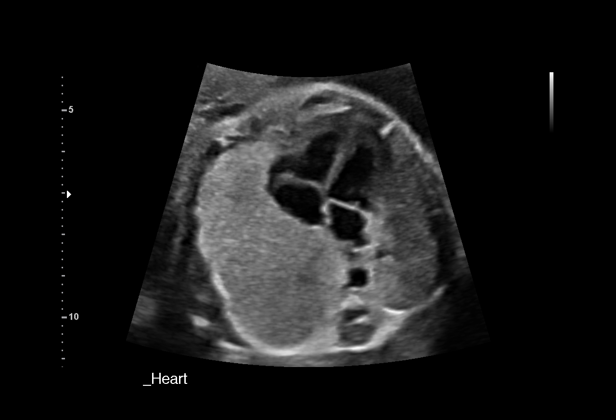
[im 20/108]
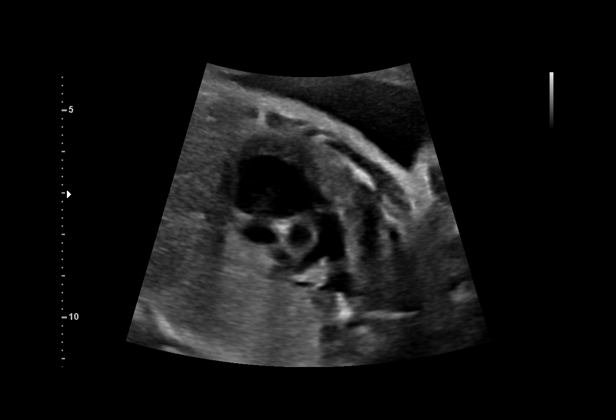
[im 28/108]
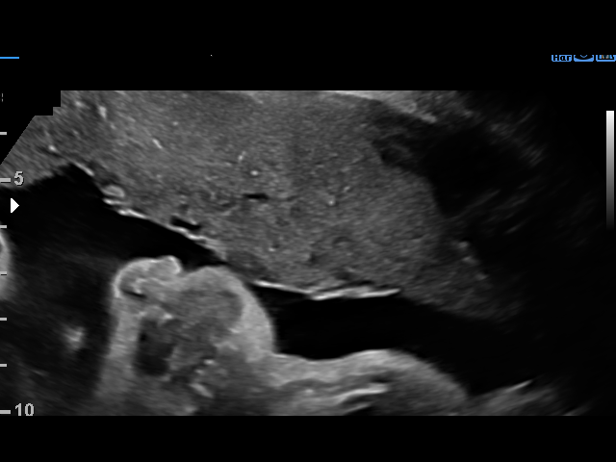
[im 36/108]
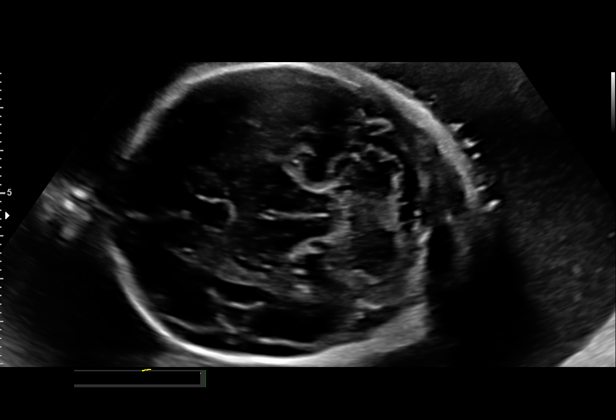
[im 44/108]
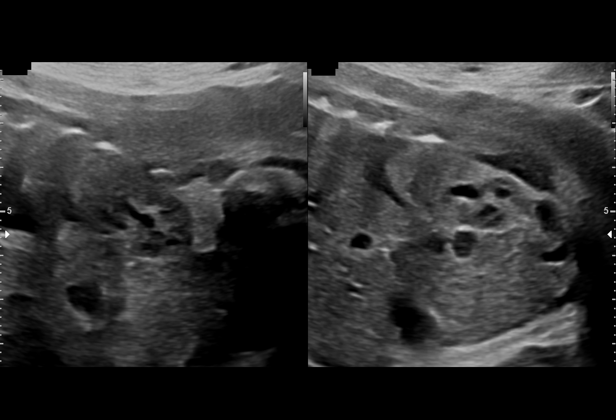
[im 56/108]
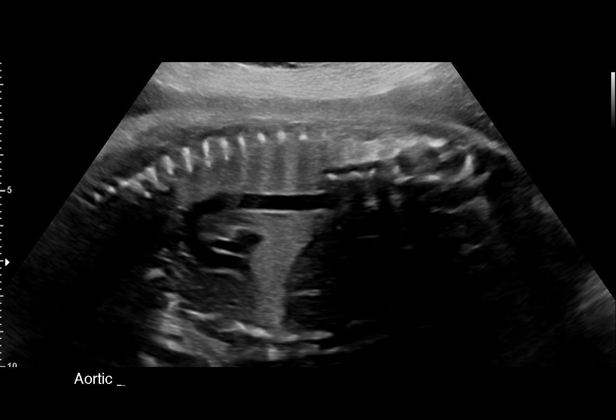
[im 64/108]
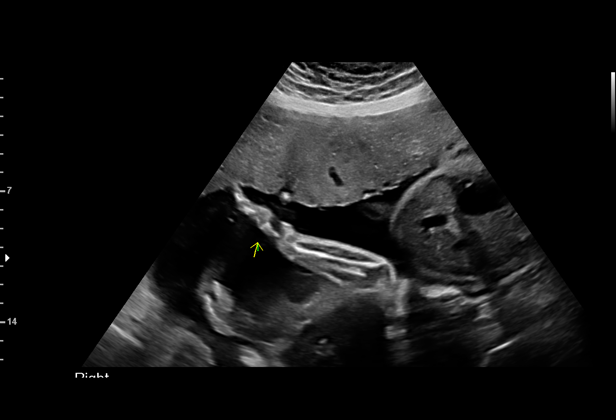
[im 72/108]
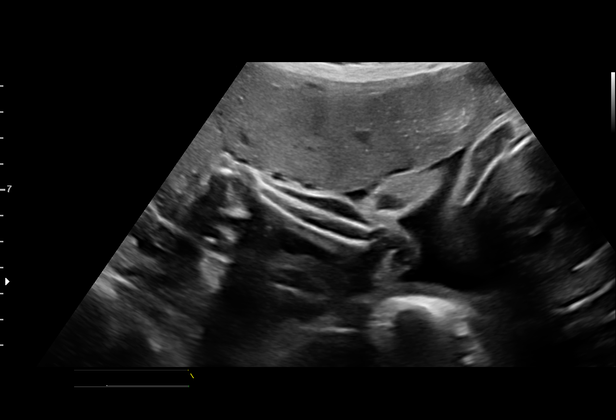
[im 80/108]
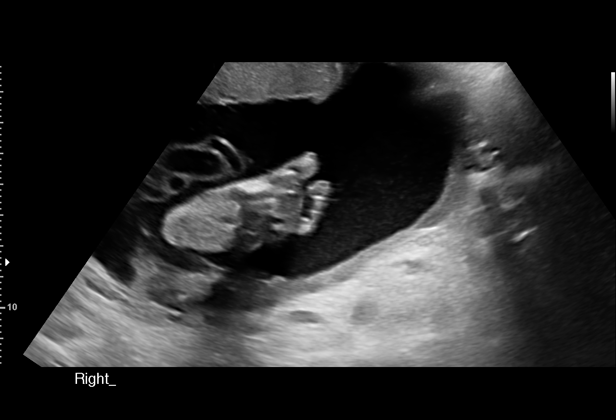
[im 88/108]
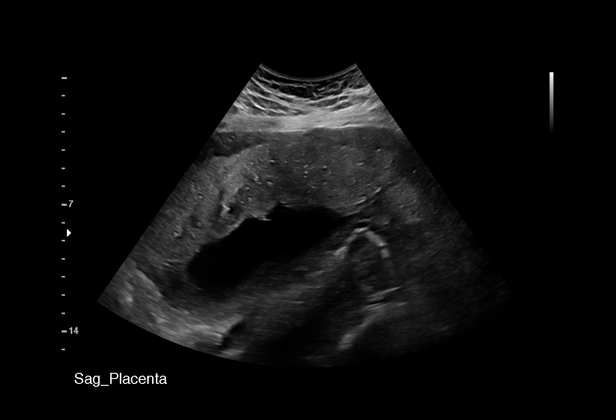
[im 96/108]
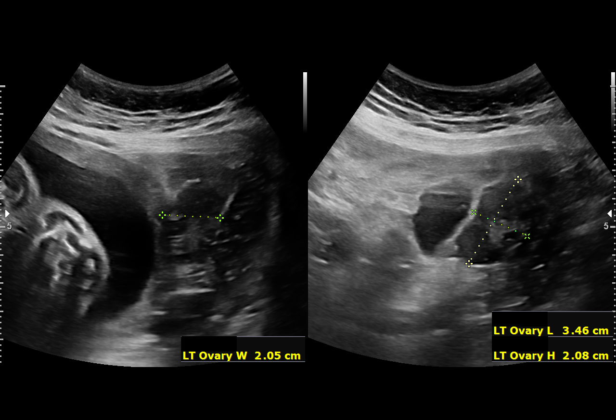
[im 104/108]
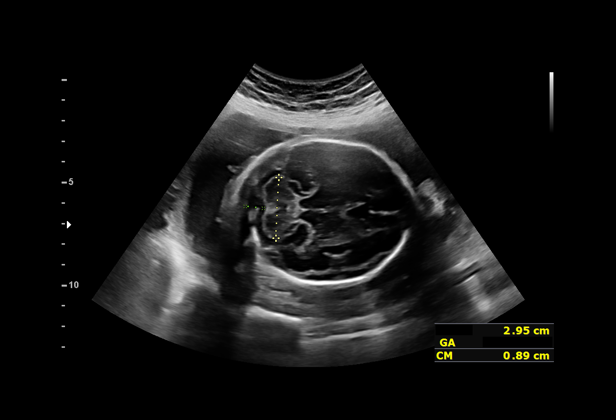

[13 of 28 positions shown; findings below may reference images not displayed]

[REDACTED]

Indications

 Obesity complicating pregnancy, third
 trimester (Pre preg BMI 32.08)
 28 weeks gestation of pregnancy
 History of cesarean delivery, currently
 pregnant
 Encounter for antenatal screening for
 malformations
 Medical complication of pregnancy
 (Hypermobile Ehlers Danlos syndrome)
 Neg AFP, Declines Horizon
Fetal Evaluation

 Num Of Fetuses:         1
 Fetal Heart Rate(bpm):  150
 Cardiac Activity:       Observed
 Presentation:           Breech
 Placenta:               Anterior
 P. Cord Insertion:      Visualized

 Amniotic Fluid
 AFI FV:      Within normal limits

 AFI Sum(cm)     %Tile       Largest Pocket(cm)
 12.93           36

 RUQ(cm)       RLQ(cm)       LUQ(cm)        LLQ(cm)

Biometry

 BPD:      68.2  mm     G. Age:  27w 3d         10  %    CI:         69.7   %    70 - 86
                                                         FL/HC:      20.7   %    19.6 -
 HC:      260.7  mm     G. Age:  28w 2d         14  %    HC/AC:      1.07        0.99 -
 AC:      243.3  mm     G. Age:  28w 4d         43  %    FL/BPD:     79.0   %    71 - 87
 FL:       53.9  mm     G. Age:  28w 4d         34  %    FL/AC:      22.2   %    20 - 24
 HUM:      49.4  mm     G. Age:  29w 0d         53  %
 CER:      31.8  mm     G. Age:  27w 3d         20  %

 LV:        4.2  mm
 CM:        3.3  mm

 Est. FW:    7888  gm    2 lb 11 oz      33  %
OB History

 Gravidity:    2         Term:   1
 Living:       1
Gestational Age

 LMP:           28w 4d        Date:  10/13/19                 EDD:   07/19/20
 U/S Today:     28w 2d                                        EDD:   07/21/20
 Best:          28w 4d     Det. By:  LMP  (10/13/19)          EDD:   07/19/20
Anatomy

 Cranium:               Appears normal         LVOT:                   Appears normal
 Cavum:                 Appears normal         Aortic Arch:            Appears normal
 Ventricles:            Appears normal         Ductal Arch:            Not well visualized
 Choroid Plexus:        Appears normal         Diaphragm:              Appears normal
 Cerebellum:            Appears normal         Stomach:                Appears normal, left
                                                                       sided
 Posterior Fossa:       Appears normal         Abdomen:                Appears normal
 Nuchal Fold:           Not applicable (>20    Abdominal Wall:         Not well visualized
                        wks GA)
 Face:                  Appears normal         Cord Vessels:           Appears normal (3
                        (orbits and profile)                           vessel cord)
 Lips:                  Appears normal         Kidneys:                Appear normal
 Palate:                Not well visualized    Bladder:                Appears normal
 Thoracic:              Appears normal         Spine:                  Appears normal
 Heart:                 Appears normal         Upper Extremities:      Appears normal
                        (4CH, axis, and
                        situs)
 RVOT:                  Appears normal         Lower Extremities:      Appears normal

 Other:  Heels visualized.  Nasal bone visualized. Hands not well visualized.
         Technically difficult due to maternal habitus and fetal position.
Cervix Uterus Adnexa

 Cervix
 Not visualized (advanced GA >45wks)

 Uterus
 No abnormality visualized.
 Right Ovary
 Within normal limits.

 Left Ovary
 Within normal limits.
Impression

 Fetal growth is appropriate for gestational age.  Amniotic fluid
 is normal and good fetal activity seen.  Fetal anatomical
 survey appears normal but limited by advanced gestational
 age.  Placenta is anterior and there is no evidence of previa
 or accreta.
 xxxxxxxxxxxxxxxxxxxxxxxxxxxxxxxxxxxxxxxxxxxxxxxxx
 Consultation (from [REDACTED])

 Maternal-Fetal Medicine

 Name: Kiri Jim
 DOB: 03/21/1996
 MRN: [id number]

 I had the pleasure of seeing Ms. Aujla today at the
 Maternal [facility]HOSPITAL].  She is G2 P1at 28w 4d
 gestation and is here for ultrasound and consultation.

 Her problems include:
 -Hypermobile Ehlers-Danlos Syndrome (hEDS)
 -Postural Orthostatic Tachycardia Syndrome (POTS)
 -Previous cesarean delivery

 Past medical history is significant for diagnosis of EDS. It was
 diagnosed by her obstetrician in her previous pregnancy
 when she saw our patient with hypermobile joints. She also
 had dizziness, tiredness and swelling of feet. Patient
 experienced POTS symptoms.
 She takes about 15 minutes to slowly get out of bed to avoid
 POTS symptoms. She uses compression stockings while on
 errands.
 She has not had symptoms of preterm labor in this pregnancy.

 Review of symptoms: Occasional headaches (no formal
 diagnosis of migraine), occasional "spots before eyes", mild
 shortness of breath and palpitations with exertions, has
 intermittent nausea and vomiting. No constipation or diarrhea.
 No abdominal or joint pains, no vaginal bleeding. No history
 of recurrent UTIs.
 Past surgical history: Cesarean delivery.
 Medications: Prenatal vitamins, Flexeril.
 Allergies: Sulfa ("flare up" and vomiting)
 Social: Denies tobacco or drug or alcohol use. She is living
 with her partner who is not the father of her first child.
 Family: Mother and sister "probably" have EDS. No history of
 venous thromboembolism in the family.

 Obstetric history is significant for a term cesarean delivery
 (failure to progress in labor) in October 2017 of a female infant
 weighing 7-13 at birth. Her pregnancy was, otherwise,
 uneventful.

 Prenatal course: She reports cell-free fetal DNA screening
 showed low risk for fetal aneuploidies. She does not have
 gestational diabetes and her blood pressures have been
 normal at prenatal visits.

 Our concerns include:

 Hypermobile Ehlers-Danlos Syndrome
 - The 9239 International Classification recognizes 13
 subtypes of EDS. Hypermobile EDS (hEDS)is the most
 common form of EDS. Accounts for 35% of EDS.
 -Autosomal dominant transmission and the exact genetic
 mechanism is not known (mutation not identified).
 -Features include skin hyperextensibility, smooth velvety skin,
 atrophic scarring, musculoskeletal complications, family
 history. Other symptoms include epistaxis (fragility of mucus
 membrane), dislocations of joints and strains. Some patients
 can have chronic pelvic or abdominal pain.
 -Cardiovascular complications including mitral/tricuspid valve
 prolapse and aortic root dilations have been reported. I
 recommended echocardiography in this pregnancy. Patient
 had normal findings on echocardiography performed in 0121
 and no structural heart disease was seen.
 -Overall, pregnancies in patients with hEDS are associated
 with good outcomes.
 -Cervical insufficiency reported with Classical EDS is not
 commonly seen. Routine prophylactic cerclage is not
 necessary. Our patient had a term vaginal delivery and she is
 currently 28 weeks' pregnant.
 -Preterm premature rupture of membranes (PPROM),
 preterm delivery and fetal growth restriction are not increased
 but reported in some case studies. On today's ultrasound,
 she had reassuring normal fetal growth.
 -Precipitate labor can occur and lead to extensive perineal
 tear. Episiotomy during delivery prevents extensive tears and
 should be considered.
 -Positioning of patients (especially after epidural) to avoid
 excess abduction is important to avoid hip dislocation.
 -Postpartum hemorrhage is common in women with EDS and
 blood should be available at short notice after delivery.
 -Wound healing can be delayed in patients with EDS. Our
 patient had wound complications after cesarean delivery. She
 is keen on VBAC that should be encouraged.
 -Anesthesia: Some patients may be resistant to local
 anesthetic and the dosage may have to be increased.
 Regional anesthesia can also have reduced efficacy. Patients
 with temporomandibular joint dislocations (or potential) can
 have problems during intubation. Consultation with
 anesthesiologist before delivery is helpful.
 -About 10% to 15% can have uterine, bladder or rectal
 prolapse after delivery.

 Postural Orthostatic Tachycardia Syndrome (POTS)
 In POTS, the postural tachycardia is accompanied by
 symptoms of cerebral hypoperfusion, sympathetic
 hyperactivity and other symptoms.
 -More common in young women who experience light-
 headedness, dizziness (subacute 1-3 months), presyncope
 episodes.
 Pathophysiology: Volume dysregulation, hyperadrenergic
 states/excessive sympathoexcitatory responses, impaired
 sympathetic vasoconstriction in the lower extremities,
 physical deconditioning.
 Diagnostic features:
 -Heart rate increase of 30 beats/min or more for adults and
 40 lbp for 12-19 years old, within 10 min of standing or head-
 up tilt in the absence of orthostatic hypotension.
 In pregnancy, POTS remained stable or improved in about
 50% of the pregnancies. After delivery at 6 months, POTS
 worsened in about 50% of cases.
 In one study, a small increase in early miscarriages was
 reported. Hyperemesis  gravidarum seems to be increased in
 these women. Preterm delivery and preeclampsia were not
 increased. Vaginal deliveries did not increase maternal
 adverse outcomes, and cesarean section should be
 performed only for obstetric indications.
 Medications, if indicated should be chosen accordingly in
 pregnancy. Some of the common medications are
 fludricortisone, and beta blockers can be safely given.
 Management:
 Four principles: 1) Patient education, 2) nonpharmacologic
 volume expansion with salt and water replacement, 3)
 Pharmacologic therapy (mineralocorticoids, cholinergics, beta
 blockers) and 4) physical conditioning/physical counter
 maneuvers.
 Previous cesarean delivery
 Patient is keen on VBAC. We discussed VBAC and its low
 complication rate of uterine scar rupture (1%). Successful
 VBAC is expected in more than 70% of cases. Patient was
 counseled that repeat cesarean deliveries increase the risk of
 placenta previa or accreta.
Recommendations

 -Fetal growth assessment every 4 weeks.
 -Maternal echocardiography to evaluate aortic root size.
 -VBAC counseling.
 -Precipitate labor to be anticipated (previous pregnancy was
 not complicated by precipitate labor).
 -Episiotomy to be considered at delivery to avoid extensive
 perineal tear.
 -Anesthesiology consultation.
                 Lv, Panfilo

## 2021-01-16 DIAGNOSIS — Z419 Encounter for procedure for purposes other than remedying health state, unspecified: Secondary | ICD-10-CM | POA: Diagnosis not present

## 2021-02-06 ENCOUNTER — Telehealth: Payer: 59 | Admitting: Physician Assistant

## 2021-02-06 DIAGNOSIS — M5441 Lumbago with sciatica, right side: Secondary | ICD-10-CM

## 2021-02-06 MED ORDER — CYCLOBENZAPRINE HCL 5 MG PO TABS: 5.0000 mg | ORAL_TABLET | Freq: Three times a day (TID) | ORAL | 1 refills | Status: DC | PRN

## 2021-02-06 MED ORDER — METHYLPREDNISOLONE 4 MG PO TBPK: ORAL_TABLET | ORAL | 0 refills | Status: DC

## 2021-02-06 NOTE — Progress Notes (Signed)
Virtual Visit Consent   Cassidy Myers, you are scheduled for a virtual visit with a [facility] provider today.     Just as with appointments in the office, your consent must be obtained to participate.  Your consent will be active for this visit and any virtual visit you may have with one of our providers in the next 365 days.     If you have a MyChart account, a copy of this consent can be sent to you electronically.  All virtual visits are billed to your insurance company just like a traditional visit in the office.    As this is a virtual visit, video technology does not allow for your provider to perform a traditional examination.  This may limit your provider's ability to fully assess your condition.  If your provider identifies any concerns that need to be evaluated in person or the need to arrange testing (such as labs, EKG, etc.), we will make arrangements to do so.     Although advances in technology are sophisticated, we cannot ensure that it will always work on either your end or our end.  If the connection with a video visit is poor, the visit may have to be switched to a telephone visit.  With either a video or telephone visit, we are not always able to ensure that we have a secure connection.     I need to obtain your verbal consent now.   Are you willing to proceed with your visit today?    Cassidy Myers has provided verbal consent on 02/06/2021 for a virtual visit (video or telephone).   Margaretann Loveless, PA-C   Date: 02/06/2021 2:16 PM   Virtual Visit via Video Note   IMargaretann Loveless, connected with  Cassidy Myers  ([id number], 1996-03-06) on 02/06/21 at  2:00 PM EDT by a video-enabled telemedicine application and verified that I am speaking with the correct person using two identifiers.  Location: Patient: Virtual Visit Location Patient: Home Provider: Virtual Visit Location Provider: Home Office   I discussed the limitations of evaluation and management by  telemedicine and the availability of in person appointments. The patient expressed understanding and agreed to proceed.    History of Present Illness: Cassidy Myers is a 25 y.o. who identifies as a female who was assigned female at birth, and is being seen today for back pain.  HPI: Back Pain This is a new problem. The current episode started 1 to 4 weeks ago (thursday). The problem occurs constantly. The problem has been gradually worsening since onset. The pain is present in the lumbar spine and sacro-iliac. The quality of the pain is described as aching, stabbing and shooting. The pain radiates to the right knee and right thigh. The pain is at a severity of 9/10 (7/10 at rest). The pain is moderate. The pain is The same all the time. The symptoms are aggravated by bending and twisting. Stiffness is present All day. Associated symptoms include numbness (at night; right leg), tingling (right leg) and weakness. Pertinent negatives include no bladder incontinence, bowel incontinence, fever or perianal numbness. She has tried bed rest, heat, ice and NSAIDs (tylenol) for the symptoms. The treatment provided no relief.     Problems:  Patient Active Problem List   Diagnosis Date Noted   Cesarean delivery delivered 07/24/2020   Acute left-sided low back pain without sciatica 12/07/2019   Pregnancy 11/24/2019   Anemia, iron deficiency 11/11/2017   Ehlers-Danlos syndrome 11/07/2017  Anxiety and depression 03/09/2013    Allergies:  Allergies  Allergen Reactions   Latex Itching and Other (See Comments)    Redness.   Sulfa Antibiotics Nausea And Vomiting   Shellfish Allergy Nausea Only and Rash   Medications:  Current Outpatient Medications:    cyclobenzaprine (FLEXERIL) 5 MG tablet, Take 1 tablet (5 mg total) by mouth 3 (three) times daily as needed for muscle spasms., Disp: 30 tablet, Rfl: 1   methylPREDNISolone (MEDROL) 4 MG TBPK tablet, 6 day taper; take as directed on package instructions,  Disp: 21 tablet, Rfl: 0   acetaminophen (TYLENOL) 500 MG tablet, Take 1,000 mg by mouth every 6 (six) hours as needed for mild pain or headache., Disp: , Rfl:    docusate sodium (COLACE) 100 MG capsule, Take 1 capsule (100 mg total) by mouth 2 (two) times daily., Disp: 60 capsule, Rfl: 2   ibuprofen (ADVIL) 600 MG tablet, Take 1 tablet (600 mg total) by mouth every 6 (six) hours as needed., Disp: 30 tablet, Rfl: 0   oxyCODONE (OXY IR/ROXICODONE) 5 MG immediate release tablet, Take 1 tablet (5 mg total) by mouth every 4 (four) hours as needed for severe pain., Disp: 15 tablet, Rfl: 0   pantoprazole (PROTONIX) 40 MG tablet, Take 40 mg by mouth daily., Disp: , Rfl:    Prenatal Vit-Fe Fumarate-FA (PRENATAL MULTIVITAMIN) TABS tablet, Take 1 tablet by mouth daily at 12 noon., Disp: , Rfl:   Observations/Objective: Patient is well-developed, well-nourished in no acute distress.  Resting comfortably at home.  Head is normocephalic, atraumatic.  No labored breathing.  Speech is clear and coherent with logical content.  Patient is alert and oriented at baseline.    Assessment and Plan: 1. Acute midline low back pain with right-sided sciatica - methylPREDNISolone (MEDROL) 4 MG TBPK tablet; 6 day taper; take as directed on package instructions  Dispense: 21 tablet; Refill: 0 - cyclobenzaprine (FLEXERIL) 5 MG tablet; Take 1 tablet (5 mg total) by mouth 3 (three) times daily as needed for muscle spasms.  Dispense: 30 tablet; Refill: 1  - Low back strain vs herniated disc - Will treat with flexeril and medrol (breastfeeding precautions discussed) - Continue heat - Epsom salt soaks can help with inflammation and spasm - Will provide light exercises and stretches via AVS - Seek in person evaluation if symptoms worsen or fail to improve  Follow Up Instructions: I discussed the assessment and treatment plan with the patient. The patient was provided an opportunity to ask questions and all were answered.  The patient agreed with the plan and demonstrated an understanding of the instructions.  A copy of instructions were sent to the patient via MyChart.  The patient was advised to call back or seek an in-person evaluation if the symptoms worsen or if the condition fails to improve as anticipated.  Time:  I spent 17 minutes with the patient via telehealth technology discussing the above problems/concerns.    Margaretann Loveless, PA-C

## 2021-02-06 NOTE — Patient Instructions (Signed)
Cassidy Myers, thank you for joining Margaretann Loveless, PA-C for today's virtual visit.  While this provider is not your primary care provider (PCP), if your PCP is located in our provider database this encounter information will be shared with them immediately following your visit.  Consent: (Patient) Cassidy Myers provided verbal consent for this virtual visit at the beginning of the encounter.  Current Medications:  Current Outpatient Medications:    cyclobenzaprine (FLEXERIL) 5 MG tablet, Take 1 tablet (5 mg total) by mouth 3 (three) times daily as needed for muscle spasms., Disp: 30 tablet, Rfl: 1   methylPREDNISolone (MEDROL) 4 MG TBPK tablet, 6 day taper; take as directed on package instructions, Disp: 21 tablet, Rfl: 0   acetaminophen (TYLENOL) 500 MG tablet, Take 1,000 mg by mouth every 6 (six) hours as needed for mild pain or headache., Disp: , Rfl:    docusate sodium (COLACE) 100 MG capsule, Take 1 capsule (100 mg total) by mouth 2 (two) times daily., Disp: 60 capsule, Rfl: 2   ibuprofen (ADVIL) 600 MG tablet, Take 1 tablet (600 mg total) by mouth every 6 (six) hours as needed., Disp: 30 tablet, Rfl: 0   oxyCODONE (OXY IR/ROXICODONE) 5 MG immediate release tablet, Take 1 tablet (5 mg total) by mouth every 4 (four) hours as needed for severe pain., Disp: 15 tablet, Rfl: 0   pantoprazole (PROTONIX) 40 MG tablet, Take 40 mg by mouth daily., Disp: , Rfl:    Prenatal Vit-Fe Fumarate-FA (PRENATAL MULTIVITAMIN) TABS tablet, Take 1 tablet by mouth daily at 12 noon., Disp: , Rfl:    Medications ordered in this encounter:  Meds ordered this encounter  Medications   methylPREDNISolone (MEDROL) 4 MG TBPK tablet    Sig: 6 day taper; take as directed on package instructions    Dispense:  21 tablet    Refill:  0    Order Specific Question:   Supervising Provider    Answer:   Hyacinth Meeker, BRIAN [[id number]]   cyclobenzaprine (FLEXERIL) 5 MG tablet    Sig: Take 1 tablet (5 mg total) by mouth 3  (three) times daily as needed for muscle spasms.    Dispense:  30 tablet    Refill:  1    Order Specific Question:   Supervising Provider    Answer:   Hyacinth Meeker, BRIAN [[id number]]     *If you need refills on other medications prior to your next appointment, please contact your pharmacy*  Follow-Up: Call back or seek an in-person evaluation if the symptoms worsen or if the condition fails to improve as anticipated.  Other Instructions Acute Back Pain, Adult Acute back pain is sudden and usually short-lived. It is often caused by an injury to the muscles and tissues in the back. The injury may result from: A muscle, tendon, or ligament getting overstretched or torn. Ligaments are tissues that connect bones to each other. Lifting something improperly can cause a back strain. Wear and tear (degeneration) of the spinal disks. Spinal disks are circular tissue that provide cushioning between the bones of the spine (vertebrae). Twisting motions, such as while playing sports or doing yard work. A hit to the back. Arthritis. You may have a physical exam, lab tests, and imaging tests to find the cause of your pain. Acute back pain usually goes away with rest and home care. Follow these instructions at home: Managing pain, stiffness, and swelling Take over-the-counter and prescription medicines only as told by your health care provider. Treatment may include medicines for  pain and inflammation that are taken by mouth or applied to the skin, or muscle relaxants. Your health care provider may recommend applying ice during the first 24-48 hours after your pain starts. To do this: Put ice in a plastic bag. Place a towel between your skin and the bag. Leave the ice on for 20 minutes, 2-3 times a day. Remove the ice if your skin turns bright red. This is very important. If you cannot feel pain, heat, or cold, you have a greater risk of damage to the area. If directed, apply heat to the affected area as often as  told by your health care provider. Use the heat source that your health care provider recommends, such as a moist heat pack or a heating pad. Place a towel between your skin and the heat source. Leave the heat on for 20-30 minutes. Remove the heat if your skin turns bright red. This is especially important if you are unable to feel pain, heat, or cold. You have a greater risk of getting burned. Activity  Do not stay in bed. Staying in bed for more than 1-2 days can delay your recovery. Sit up and stand up straight. Avoid leaning forward when you sit or hunching over when you stand. If you work at a desk, sit close to it so you do not need to lean over. Keep your chin tucked in. Keep your neck drawn back, and keep your elbows bent at a 90-degree angle (right angle). Sit high and close to the steering wheel when you drive. Add lower back (lumbar) support to your car seat, if needed. Take short walks on even surfaces as soon as you are able. Try to increase the length of time you walk each day. Do not sit, drive, or stand in one place for more than 30 minutes at a time. Sitting or standing for long periods of time can put stress on your back. Do not drive or use heavy machinery while taking prescription pain medicine. Use proper lifting techniques. When you bend and lift, use positions that put less stress on your back: [place] your knees. Keep the load close to your body. Avoid twisting. Exercise regularly as told by your health care provider. Exercising helps your back heal faster and helps prevent back injuries by keeping muscles strong and flexible. Work with a physical therapist to make a safe exercise program, as recommended by your health care provider. Do any exercises as told by your physical therapist. Lifestyle Maintain a healthy weight. Extra weight puts stress on your back and makes it difficult to have good posture. Avoid activities or situations that make you feel anxious or stressed.  Stress and anxiety increase muscle tension and can make back pain worse. Learn ways to manage anxiety and stress, such as through exercise. General instructions Sleep on a firm mattress in a comfortable position. Try lying on your side with your knees slightly bent. If you lie on your back, put a pillow under your knees. Keep your head and neck in a straight line with your spine (neutral position) when using electronic equipment like smartphones or pads. To do this: Raise your smartphone or pad to look at it instead of bending your head or neck to look down. Put the smartphone or pad at the level of your face while looking at the screen. Follow your treatment plan as told by your health care provider. This may include: Cognitive or behavioral therapy. Acupuncture or massage therapy. Meditation or yoga. Contact  a health care provider if: You have pain that is not relieved with rest or medicine. You have increasing pain going down into your legs or buttocks. Your pain does not improve after 2 weeks. You have pain at night. You lose weight without trying. You have a fever or chills. You develop nausea or vomiting. You develop abdominal pain. Get help right away if: You develop new bowel or bladder control problems. You have unusual weakness or numbness in your arms or legs. You feel faint. These symptoms may represent a serious problem that is an emergency. Do not wait to see if the symptoms will go away. Get medical help right away. Call your local emergency services (911 in the [place] Do not drive yourself to the hospital. Summary Acute back pain is sudden and usually short-lived. Use proper lifting techniques. When you bend and lift, use positions that put less stress on your back. Take over-the-counter and prescription medicines only as told by your health care provider, and apply heat or ice as told. This information is not intended to replace advice given to you by your health care  provider. Make sure you discuss any questions you have with your health care provider. Document Revised: 07/26/2020 Document Reviewed: 07/26/2020 Elsevier Patient Education  2022 [employer]   Back Exercises The following exercises strengthen the muscles that help to support the trunk (torso) and back. They also help to keep the lower back flexible. Doing these exercises can help to prevent or lessen existing low back pain. If you have back pain or discomfort, try doing these exercises 2-3 times each day or as told by your health care provider. As your pain improves, do them once each day, but increase the number of times that you repeat the steps for each exercise (do more repetitions). To prevent the recurrence of back pain, continue to do these exercises once each day or as told by your health care provider. Do exercises exactly as told by your health care provider and adjust them as directed. It is normal to feel mild stretching, pulling, tightness, or discomfort as you do these exercises, but you should stop right away if you feel sudden pain or your pain gets worse. Exercises Single knee to chest Repeat these steps 3-5 times for each leg: Lie on your back on a firm bed or the floor with your legs extended. Bring one knee to your chest. Your other leg should stay extended and in contact with the floor. Hold your knee in place by grabbing your knee or thigh with both hands and hold. Pull on your knee until you feel a gentle stretch in your lower back or buttocks. Hold the stretch for 10-30 seconds. Slowly release and straighten your leg. Pelvic tilt Repeat these steps 5-10 times: Lie on your back on a firm bed or the floor with your legs extended. Bend your knees so they are pointing toward the ceiling and your feet are flat on the floor. Tighten your lower abdominal muscles to press your lower back against the floor. This motion will tilt your pelvis so your tailbone points up toward  the ceiling instead of pointing to your feet or the floor. With gentle tension and even breathing, hold this position for 5-10 seconds. Cat-cow Repeat these steps until your lower back becomes more flexible: Get into a hands-and-knees position on a firm bed or the floor. Keep your hands under your shoulders, and keep your knees under your hips. You may place padding under  your knees for comfort. Let your head hang down toward your chest. Contract your abdominal muscles and point your tailbone toward the floor so your lower back becomes rounded like the back of a cat. Hold this position for 5 seconds. Slowly lift your head, let your abdominal muscles relax, and point your tailbone up toward the ceiling so your back forms a sagging arch like the back of a cow. Hold this position for 5 seconds.  Press-ups Repeat these steps 5-10 times: Lie on your abdomen (face-down) on a firm bed or the floor. Place your palms near your head, about shoulder-width apart. Keeping your back as relaxed as possible and keeping your hips on the floor, slowly straighten your arms to raise the top half of your body and lift your shoulders. Do not use your back muscles to raise your upper torso. You may adjust the placement of your hands to make yourself more comfortable. Hold this position for 5 seconds while you keep your back relaxed. Slowly return to lying flat on the floor.  Bridges Repeat these steps 10 times: Lie on your back on a firm bed or the floor. Bend your knees so they are pointing toward the ceiling and your feet are flat on the floor. Your arms should be flat at your sides, next to your body. Tighten your buttocks muscles and lift your buttocks off the floor until your waist is at almost the same height as your knees. You should feel the muscles working in your buttocks and the back of your thighs. If you do not feel these muscles, slide your feet 1-2 inches (2.5-5 cm) farther away from your  buttocks. Hold this position for 3-5 seconds. Slowly lower your hips to the starting position, and allow your buttocks muscles to relax completely. If this exercise is too easy, try doing it with your arms crossed over your chest. Abdominal crunches Repeat these steps 5-10 times: Lie on your back on a firm bed or the floor with your legs extended. Bend your knees so they are pointing toward the ceiling and your feet are flat on the floor. Cross your arms over your chest. Tip your chin slightly toward your chest without bending your neck. Tighten your abdominal muscles and slowly raise your torso high enough to lift your shoulder blades a tiny bit off the floor. Avoid raising your torso higher than that because it can put too much stress on your lower back and does not help to strengthen your abdominal muscles. Slowly return to your starting position. Back lifts Repeat these steps 5-10 times: Lie on your abdomen (face-down) with your arms at your sides, and rest your forehead on the floor. Tighten the muscles in your legs and your buttocks. Slowly lift your chest off the floor while you keep your hips pressed to the floor. Keep the back of your head in line with the curve in your back. Your eyes should be looking at the floor. Hold this position for 3-5 seconds. Slowly return to your starting position. Contact a health care provider if: Your back pain or discomfort gets much worse when you do an exercise. Your worsening back pain or discomfort does not lessen within 2 hours after you exercise. If you have any of these problems, stop doing these exercises right away. Do not do them again unless your health care provider says that you can. Get help right away if: You develop sudden, severe back pain. If this happens, stop doing the exercises right away.  Do not do them again unless your health care provider says that you can. This information is not intended to replace advice given to you by  your health care provider. Make sure you discuss any questions you have with your health care provider. Document Revised: 07/17/2020 Document Reviewed: 07/17/2020 Elsevier Patient Education  2022 [employer].    If you have been instructed to have an in-person evaluation today at a local Urgent Care facility, please use the link below. It will take you to a list of all of our available [facility], including address, phone number and hours of operation. Please do not delay care.  [facility]  If you or a family member do not have a primary care provider, use the link below to schedule a visit and establish care. When you choose a [facility] primary care physician or advanced practice provider, you gain a long-term partner in health. Find a Primary Care Provider  Learn more about [facility] in-office and virtual care options:  - Get Care Now

## 2021-02-15 DIAGNOSIS — Z419 Encounter for procedure for purposes other than remedying health state, unspecified: Secondary | ICD-10-CM | POA: Diagnosis not present

## 2021-03-18 DIAGNOSIS — Z419 Encounter for procedure for purposes other than remedying health state, unspecified: Secondary | ICD-10-CM | POA: Diagnosis not present

## 2021-04-17 DIAGNOSIS — Z419 Encounter for procedure for purposes other than remedying health state, unspecified: Secondary | ICD-10-CM | POA: Diagnosis not present

## 2021-05-18 DIAGNOSIS — Z419 Encounter for procedure for purposes other than remedying health state, unspecified: Secondary | ICD-10-CM | POA: Diagnosis not present

## 2021-06-18 DIAGNOSIS — Z419 Encounter for procedure for purposes other than remedying health state, unspecified: Secondary | ICD-10-CM | POA: Diagnosis not present

## 2021-07-16 DIAGNOSIS — Z419 Encounter for procedure for purposes other than remedying health state, unspecified: Secondary | ICD-10-CM | POA: Diagnosis not present

## 2021-08-16 DIAGNOSIS — Z419 Encounter for procedure for purposes other than remedying health state, unspecified: Secondary | ICD-10-CM | POA: Diagnosis not present

## 2021-09-15 DIAGNOSIS — Z419 Encounter for procedure for purposes other than remedying health state, unspecified: Secondary | ICD-10-CM | POA: Diagnosis not present

## 2021-09-30 DIAGNOSIS — Z98891 History of uterine scar from previous surgery: Secondary | ICD-10-CM | POA: Diagnosis not present

## 2021-09-30 DIAGNOSIS — Q796 Ehlers-Danlos syndrome, unspecified: Secondary | ICD-10-CM | POA: Diagnosis not present

## 2021-09-30 DIAGNOSIS — D508 Other iron deficiency anemias: Secondary | ICD-10-CM | POA: Diagnosis not present

## 2021-09-30 DIAGNOSIS — O26893 Other specified pregnancy related conditions, third trimester: Secondary | ICD-10-CM | POA: Diagnosis not present

## 2021-09-30 DIAGNOSIS — O0993 Supervision of high risk pregnancy, unspecified, third trimester: Secondary | ICD-10-CM | POA: Diagnosis not present

## 2021-09-30 DIAGNOSIS — Z7185 Encounter for immunization safety counseling: Secondary | ICD-10-CM | POA: Diagnosis not present

## 2021-09-30 DIAGNOSIS — O99213 Obesity complicating pregnancy, third trimester: Secondary | ICD-10-CM | POA: Diagnosis not present

## 2021-09-30 DIAGNOSIS — Z6791 Unspecified blood type, Rh negative: Secondary | ICD-10-CM | POA: Diagnosis not present

## 2021-09-30 DIAGNOSIS — Z113 Encounter for screening for infections with a predominantly sexual mode of transmission: Secondary | ICD-10-CM | POA: Diagnosis not present

## 2021-09-30 DIAGNOSIS — G90A Postural orthostatic tachycardia syndrome (POTS): Secondary | ICD-10-CM | POA: Diagnosis not present

## 2021-10-16 DIAGNOSIS — Z419 Encounter for procedure for purposes other than remedying health state, unspecified: Secondary | ICD-10-CM | POA: Diagnosis not present

## 2021-10-27 ENCOUNTER — Telehealth: Payer: Self-pay | Admitting: Cardiovascular Disease

## 2021-10-27 NOTE — Telephone Encounter (Signed)
3 attempts to schedule fu appt from recall list.   Deleting recall.   

## 2021-11-15 DIAGNOSIS — Z419 Encounter for procedure for purposes other than remedying health state, unspecified: Secondary | ICD-10-CM | POA: Diagnosis not present

## 2021-12-16 DIAGNOSIS — Z419 Encounter for procedure for purposes other than remedying health state, unspecified: Secondary | ICD-10-CM | POA: Diagnosis not present

## 2022-01-16 DIAGNOSIS — Z419 Encounter for procedure for purposes other than remedying health state, unspecified: Secondary | ICD-10-CM | POA: Diagnosis not present

## 2022-02-15 DIAGNOSIS — Z419 Encounter for procedure for purposes other than remedying health state, unspecified: Secondary | ICD-10-CM | POA: Diagnosis not present

## 2022-03-02 ENCOUNTER — Encounter: Payer: 59 | Admitting: Family

## 2022-03-18 DIAGNOSIS — Z419 Encounter for procedure for purposes other than remedying health state, unspecified: Secondary | ICD-10-CM | POA: Diagnosis not present

## 2022-04-17 DIAGNOSIS — Z419 Encounter for procedure for purposes other than remedying health state, unspecified: Secondary | ICD-10-CM | POA: Diagnosis not present

## 2022-05-18 DIAGNOSIS — Z419 Encounter for procedure for purposes other than remedying health state, unspecified: Secondary | ICD-10-CM | POA: Diagnosis not present

## 2022-06-18 DIAGNOSIS — Z419 Encounter for procedure for purposes other than remedying health state, unspecified: Secondary | ICD-10-CM | POA: Diagnosis not present

## 2022-07-17 DIAGNOSIS — Z419 Encounter for procedure for purposes other than remedying health state, unspecified: Secondary | ICD-10-CM | POA: Diagnosis not present

## 2022-08-17 DIAGNOSIS — Z419 Encounter for procedure for purposes other than remedying health state, unspecified: Secondary | ICD-10-CM | POA: Diagnosis not present

## 2022-09-16 DIAGNOSIS — Z419 Encounter for procedure for purposes other than remedying health state, unspecified: Secondary | ICD-10-CM | POA: Diagnosis not present

## 2022-10-17 DIAGNOSIS — Z419 Encounter for procedure for purposes other than remedying health state, unspecified: Secondary | ICD-10-CM | POA: Diagnosis not present

## 2022-11-16 DIAGNOSIS — Z419 Encounter for procedure for purposes other than remedying health state, unspecified: Secondary | ICD-10-CM | POA: Diagnosis not present

## 2022-12-17 DIAGNOSIS — Z419 Encounter for procedure for purposes other than remedying health state, unspecified: Secondary | ICD-10-CM | POA: Diagnosis not present

## 2023-01-17 DIAGNOSIS — Z419 Encounter for procedure for purposes other than remedying health state, unspecified: Secondary | ICD-10-CM | POA: Diagnosis not present

## 2023-02-16 DIAGNOSIS — Z419 Encounter for procedure for purposes other than remedying health state, unspecified: Secondary | ICD-10-CM | POA: Diagnosis not present

## 2023-03-19 DIAGNOSIS — Z419 Encounter for procedure for purposes other than remedying health state, unspecified: Secondary | ICD-10-CM | POA: Diagnosis not present

## 2023-03-31 ENCOUNTER — Ambulatory Visit: Payer: Medicaid Other | Admitting: Primary Care

## 2023-04-18 DIAGNOSIS — Z419 Encounter for procedure for purposes other than remedying health state, unspecified: Secondary | ICD-10-CM | POA: Diagnosis not present

## 2023-05-19 DIAGNOSIS — Z419 Encounter for procedure for purposes other than remedying health state, unspecified: Secondary | ICD-10-CM | POA: Diagnosis not present

## 2023-06-06 ENCOUNTER — Encounter (HOSPITAL_COMMUNITY): Payer: Self-pay

## 2023-06-06 ENCOUNTER — Emergency Department (HOSPITAL_COMMUNITY)
Admission: EM | Admit: 2023-06-06 | Discharge: 2023-06-06 | Disposition: A | Discharge status: Home / Self Care | Payer: Medicaid Other | Attending: Emergency Medicine | Admitting: Emergency Medicine

## 2023-06-06 ENCOUNTER — Other Ambulatory Visit: Payer: Self-pay

## 2023-06-06 DIAGNOSIS — Z91040 Latex allergy status: Secondary | ICD-10-CM | POA: Diagnosis not present

## 2023-06-06 DIAGNOSIS — R519 Headache, unspecified: Secondary | ICD-10-CM | POA: Diagnosis not present

## 2023-06-06 HISTORY — DX: Postural orthostatic tachycardia syndrome (POTS): G90.A

## 2023-06-06 LAB — BASIC METABOLIC PANEL
Anion gap: 10 (ref 5–15)
BUN: 13 mg/dL (ref 6–20)
CO2: 22 mmol/L (ref 22–32)
Calcium: 9.1 mg/dL (ref 8.9–10.3)
Chloride: 106 mmol/L (ref 98–111)
Creatinine, Ser: 0.7 mg/dL (ref 0.44–1.00)
GFR, Estimated: >60 mL/min (ref >60)
Glucose, Bld: 95 mg/dL (ref 70–99)
Potassium: 3.8 mmol/L (ref 3.5–5.1)
Sodium: 138 mmol/L (ref 135–145)

## 2023-06-06 LAB — CBC
HCT: 35.5 % — ABNORMAL LOW (ref 36.0–46.0)
Hemoglobin: 11.9 g/dL — ABNORMAL LOW (ref 12.0–15.0)
MCH: 29.5 pg (ref 26.0–34.0)
MCHC: 33.5 g/dL (ref 30.0–36.0)
MCV: 87.9 fL (ref 80.0–100.0)
Platelets: 290 10*3/uL (ref 150–400)
RBC: 4.04 MIL/uL (ref 3.87–5.11)
RDW: 12.8 % (ref 11.5–15.5)
WBC: 6.5 10*3/uL (ref 4.0–10.5)
nRBC: 0 % (ref 0.0–0.2)

## 2023-06-06 LAB — HCG, SERUM, QUALITATIVE: Preg, Serum: NEGATIVE

## 2023-06-06 MED ORDER — DIPHENHYDRAMINE HCL 50 MG/ML IJ SOLN
50.0000 mg | Freq: Once | INTRAMUSCULAR | Status: AC
  Administered 2023-06-06: 50 mg via INTRAVENOUS
  Filled 2023-06-06: qty 1

## 2023-06-06 MED ORDER — KETOROLAC TROMETHAMINE 15 MG/ML IJ SOLN
15.0000 mg | Freq: Once | INTRAMUSCULAR | Status: AC
  Administered 2023-06-06: 15 mg via INTRAVENOUS
  Filled 2023-06-06: qty 1

## 2023-06-06 MED ORDER — METOCLOPRAMIDE HCL 5 MG/ML IJ SOLN
10.0000 mg | Freq: Once | INTRAMUSCULAR | Status: AC
  Administered 2023-06-06: 10 mg via INTRAVENOUS
  Filled 2023-06-06: qty 2

## 2023-06-06 NOTE — Discharge Instructions (Signed)
I am glad that you felt better after treatment for your headache.  Please follow-up with your doctor and eventually follow-up with a neurologist for further evaluation and managements of your recurrent headache as there are abortive medication that can be helpful to decrease your risk of developing headaches.

## 2023-06-06 NOTE — ED Provider Notes (Signed)
EMERGENCY DEPARTMENT AT [facility] Provider Note   CSN: [id number] Arrival date & time: 06/06/23  1210     History  Chief Complaint  Patient presents with   Migraine    Cassidy Myers is a 28 y.o. female.  The history is provided by the patient and medical records. No language interpreter was used.  Migraine     28 year old female with significant history of EDS, POTS, migraine headache, anemia, anxiety, presenting with complaint of headache.  Patient report for the past 3 to 4 days she has had recurrent headache.  She describes headache as a throbbing sensation to the left side of the face, with associate light and sound sensitivity, had some chills, and overall not feeling well.  States she did vomit a few times yesterday.  She denies taking home medication including Tylenol ibuprofen without adequate relief.  She states headaches felt somewhat similar to her prior headache.  She has never been officially diagnosed with migraine as she is unable to follow-up with a neurologist at this time.  She attributed her symptoms due to increasing stress and with weather changes.  She does not endorse any fever, neck stiffness, rash, focal numbness or focal weakness.  Her last menstrual period was a week ago.  She mention having similar headache neck and this in the past with relief with migraine cocktail.  Home Medications Prior to Admission medications   Medication Sig Start Date End Date Taking? Authorizing Provider  acetaminophen (TYLENOL) 500 MG tablet Take 1,000 mg by mouth every 6 (six) hours as needed for mild pain or headache.    [provider]  cyclobenzaprine (FLEXERIL) 5 MG tablet Take 1 tablet (5 mg total) by mouth 3 (three) times daily as needed for muscle spasms. 02/06/21   Margaretann Loveless, PA-C  docusate sodium (COLACE) 100 MG capsule Take 1 capsule (100 mg total) by mouth 2 (two) times daily. 07/26/20   Ranae Pila, MD  ibuprofen  (ADVIL) 600 MG tablet Take 1 tablet (600 mg total) by mouth every 6 (six) hours as needed. 07/26/20   Ranae Pila, MD  methylPREDNISolone (MEDROL) 4 MG TBPK tablet 6 day taper; take as directed on package instructions 02/06/21   Margaretann Loveless, PA-C  oxyCODONE (OXY IR/ROXICODONE) 5 MG immediate release tablet Take 1 tablet (5 mg total) by mouth every 4 (four) hours as needed for severe pain. 07/26/20   Ranae Pila, MD  pantoprazole (PROTONIX) 40 MG tablet Take 40 mg by mouth daily. 06/28/20   [provider]  Prenatal Vit-Fe Fumarate-FA (PRENATAL MULTIVITAMIN) TABS tablet Take 1 tablet by mouth daily at 12 noon.    [provider]      Allergies    Latex, Sulfa antibiotics, and Shellfish allergy    Review of Systems   Review of Systems  All other systems reviewed and are negative.   Physical Exam Updated Vital Signs BP 124/72 (BP Location: Right Arm)   Pulse 60   Temp 98.1 F (36.7 C)   Resp 16   LMP 05/31/2023 (Approximate)   SpO2 100%  Physical Exam Vitals and nursing note reviewed.  Constitutional:      General: She is not in acute distress.    Appearance: She is well-developed.  HENT:     Head: Atraumatic.     Nose: Nose normal.     Mouth/Throat:     Mouth: Mucous membranes are moist.  Eyes:     Extraocular Movements:  Extraocular movements intact.     Conjunctiva/sclera: Conjunctivae normal.     Pupils: Pupils are equal, round, and reactive to light.  Cardiovascular:     Rate and Rhythm: Normal rate and regular rhythm.     Pulses: Normal pulses.     Heart sounds: Normal heart sounds.  Pulmonary:     Effort: Pulmonary effort is normal.  Abdominal:     Palpations: Abdomen is soft.     Tenderness: There is no abdominal tenderness.  Musculoskeletal:     Cervical back: Normal range of motion and neck supple. No rigidity.     Comments: 5 out of 5 strength to all 4 extremities  Skin:    Findings: No rash.  Neurological:      Mental Status: She is alert and oriented to person, place, and time.     Comments: Neurologic exam:  Speech clear, pupils equal round reactive to light, extraocular movements intact  Normal peripheral visual fields Cranial nerves III through XII normal including no facial droop Follows commands, moves all extremities x4, normal strength to bilateral upper and lower extremities at all major muscle groups including grip Sensation normal to light touch  No pronator drift Gait normal   Psychiatric:        Mood and Affect: Mood normal.     ED Results / Procedures / Treatments   Labs (all labs ordered are listed, but only abnormal results are displayed) Labs Reviewed  CBC - Abnormal; Notable for the following components:      Result Value   Hemoglobin 11.9 (*)    HCT 35.5 (*)    All other components within normal limits  BASIC METABOLIC PANEL  HCG, SERUM, QUALITATIVE    EKG None  Radiology No results found.  Procedures Procedures    Medications Ordered in ED Medications  ketorolac (TORADOL) 15 MG/ML injection 15 mg (15 mg Intravenous Given 06/06/23 1619)  diphenhydrAMINE (BENADRYL) injection 50 mg (50 mg Intravenous Given 06/06/23 1617)  metoCLOPramide (REGLAN) injection 10 mg (10 mg Intravenous Given 06/06/23 1622)    ED Course/ Medical Decision Making/ A&P                                 Medical Decision Making Amount and/or Complexity of Data Reviewed Labs: ordered.  Risk Prescription drug management.   BP 108/79 (BP Location: Right Arm)   Pulse 73   Temp 97.8 F (36.6 C) (Oral)   Resp 16   LMP 05/31/2023 (Approximate)   SpO2 99%   85:65 PM  28 year old female with significant history of EDS, POTS, migraine headache, anemia, anxiety, presenting with complaint of headache.  Patient report for the past 3 to 4 days she has had recurrent headache.  She describes headache as a throbbing sensation to the left side of the face, with associate light and sound  sensitivity, had some chills, and overall not feeling well.  States she did vomit a few times yesterday.  She denies taking home medication including Tylenol ibuprofen without adequate relief.  She states headaches felt somewhat similar to her prior headache.  She has never been officially diagnosed with migraine as she is unable to follow-up with a neurologist at this time.  She attributed her symptoms due to increasing stress and with weather changes.  She does not endorse any fever, neck stiffness, rash, focal numbness or focal weakness.  Her last menstrual period was a week ago.  She mention having similar headache neck and this in the past with relief with migraine cocktail.  On exam the is a well-appearing female sitting comfortably in the chair appears to be in no acute discomfort.  However during our conversation she does became a bit more tearful.  She does not exhibit any focal neurodeficit concerning for stroke or space-occupying lesion.  She does not exhibit any nuchal rigidity concerning for meningitis.  No acute onset thunderclap headache concerning for subarachnoid hemorrhage.  Headache is suggestive of migraine headache likely brought on by stress.  Will provide patient with a migraine cocktail and will monitor closely.  -Labs ordered, independently viewed and interpreted by me.  Labs remarkable for preg test negative, electrolyte panels are reassuring, no anemia -The patient was maintained on a cardiac monitor.  I personally viewed and interpreted the cardiac monitored which showed an underlying rhythm of: NRS -Imaging including head CT and brain MRI considered but without red flags I felt this is low yield -This patient presents to the ED for concern of headache, this involves an extensive number of treatment options, and is a complaint that carries with it a high risk of complications and morbidity.  The differential diagnosis includes migraine headache, clustered headache, tension,  headach, SAH, meningitis, hemorrhagic stroke, aneurysm -Co morbidities that complicate the patient evaluation includes recurrent headache, EDS, anemia, POTS -Treatment includes migraine cocktail -Reevaluation of the patient after these medicines showed that the patient improved -PCP office notes or outside notes reviewed -Escalation to admission/observation considered: patients feels much better, is comfortable with discharge, and will follow up with PCP -Prescription medication considered, patient comfortable with OTC meds -Social Determinant of Health considered         Final Clinical Impression(s) / ED Diagnoses Final diagnoses:  Bad headache    Rx / DC Orders ED Discharge Orders     None         Fayrene Helper, PA-C 06/06/23 1753    Vanetta Mulders, MD 06/09/23 [phone]

## 2023-06-06 NOTE — ED Triage Notes (Signed)
Pt c.o intermittent left sided migraine for the past 2 months with vision changes, nausea and dizziness associated. Pt taking tylenol and ibuprofen without relief. Hx of POTS and EDS.

## 2023-06-19 DIAGNOSIS — Z419 Encounter for procedure for purposes other than remedying health state, unspecified: Secondary | ICD-10-CM | POA: Diagnosis not present

## 2023-07-16 ENCOUNTER — Encounter (HOSPITAL_COMMUNITY): Payer: Self-pay

## 2023-07-16 ENCOUNTER — Ambulatory Visit (HOSPITAL_COMMUNITY)
Admission: EM | Admit: 2023-07-16 | Discharge: 2023-07-16 | Disposition: A | Discharge status: Home / Self Care | Payer: Medicaid Other | Attending: Emergency Medicine | Admitting: Emergency Medicine

## 2023-07-16 DIAGNOSIS — S51811A Laceration without foreign body of right forearm, initial encounter: Secondary | ICD-10-CM | POA: Diagnosis not present

## 2023-07-16 MED ORDER — LIDOCAINE-EPINEPHRINE 1 %-1:100000 IJ SOLN
INTRAMUSCULAR | Status: AC
Start: 2023-07-16 — End: ?
  Filled 2023-07-16: qty 1

## 2023-07-16 NOTE — Discharge Instructions (Signed)
 We placed 4 sutures in your arm laceration. Keep the area clean and dry.  Do not get it wet for the next 24 hours.  Afterwards you can gently clean with an antibacterial soap such as Dial and pat dry.  You can also apply topical antibacterial ointment such as Neosporin.  Return to clinic in 7 days for suture removal.  Come back sooner if you develop any signs or symptoms of infection.

## 2023-07-16 NOTE — ED Triage Notes (Signed)
 Cut to the posterior left forearm onset today when her glass laundry door broke. Onset around 1300 today.

## 2023-07-16 NOTE — ED Provider Notes (Signed)
 [facility]    CSN: [id number] Arrival date & time: 07/16/23  1437      History   Chief Complaint Chief Complaint  Patient presents with   Extremity Laceration    HPI Cassidy Myers is a 28 y.o. female.   Patient presents to clinic over concerns of a laceration to her distal forearm that she sustained earlier today.  Her arm went through a glass laundry door.  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  Happened prior to arrival, around 1 PM.  Last Tdap was updated in her last pregnancy, around a year ago.  Was going to try to manage her cut at home but when she saw some fatty tissue she decided to come to urgent care.  The history is provided by the patient and medical records.    Past Medical History:  Diagnosis Date   Acute cystitis without hematuria 11/24/2019   ADHD    Allergy    Anemia    Anxiety    Depression    Dysautonomia (HCC)    Ehlers-Danlos syndrome    Headache(784.0)    Postpartum hematoma    POTS (postural orthostatic tachycardia syndrome)    Vaginal Pap smear, abnormal     Patient Active Problem List   Diagnosis Date Noted   Cesarean delivery delivered 07/24/2020   Acute left-sided low back pain without sciatica 12/07/2019   Pregnancy 11/24/2019   Anemia, iron deficiency 11/11/2017   Ehlers-Danlos syndrome 11/07/2017   Anxiety and depression 03/09/2013    Past Surgical History:  Procedure Laterality Date   CESAREAN SECTION N/A 11/08/2017   Procedure: CESAREAN SECTION;  Surgeon: Jaymes Graff, MD;  Location: [facility];  Service: Obstetrics;  Laterality: N/A;   CESAREAN SECTION N/A 07/24/2020   Procedure: CESAREAN SECTION;  Surgeon: Mitchel Honour, DO;  Location: [facility];  Service: Obstetrics;  Laterality: N/A;   WISDOM TOOTH EXTRACTION      OB History     Gravida  2   Para  2   Term  2   Preterm      AB      Living  2      SAB      IAB      Ectopic       Multiple  0   Live Births  2            Home Medications    Prior to Admission medications   Not on File    Family History Family History  Problem Relation Age of Onset   Cancer Mother        Cervical   Asthma Mother    Depression Mother    Depression Father    Schizophrenia Father    Clotting disorder Father    Depression Maternal Grandmother    Depression Maternal Grandfather    Depression Paternal Grandmother    Diabetes Paternal Grandmother    Depression Paternal Grandfather    Diabetes Paternal Grandfather     Social History Social History   Tobacco Use   Smoking status: Never   Smokeless tobacco: Never  Vaping Use   Vaping status: Never Used  Substance Use Topics   Alcohol use: Not Currently    Comment: socially   Drug use: No     Allergies   Latex, Sulfa antibiotics, and Shellfish allergy   Review of Systems Review of Systems  Per HPI   Physical Exam  Triage Vital Signs ED Triage Vitals  Encounter Vitals Group     BP 07/16/23 1614 123/80     Systolic BP Percentile --      Diastolic BP Percentile --      Pulse Rate 07/16/23 1614 70     Resp 07/16/23 1614 16     Temp 07/16/23 1614 98.4 F (36.9 C)     Temp Source 07/16/23 1614 Oral     SpO2 07/16/23 1614 100 %     Weight --      Height --      Head Circumference --      Peak Flow --      Pain Score 07/16/23 1611 1     Pain Loc --      Pain Education --      Exclude from Growth Chart --    No data found.  Updated Vital Signs BP 123/80 (BP Location: Left Arm)   Pulse 70   Temp 98.4 F (36.9 C) (Oral)   Resp 16   SpO2 100%   Visual Acuity Right Eye Distance:   Left Eye Distance:   Bilateral Distance:    Right Eye Near:   Left Eye Near:    Bilateral Near:     Physical Exam Vitals and nursing note reviewed.  Constitutional:      Appearance: Normal appearance.  HENT:     Head: Normocephalic and atraumatic.     Right Ear: External ear normal.     Left Ear:  External ear normal.     Nose: Nose normal.     Mouth/Throat:     Mouth: Mucous membranes are moist.  Eyes:     Conjunctiva/sclera: Conjunctivae normal.  Cardiovascular:     Rate and Rhythm: Normal rate.  Pulmonary:     Effort: Pulmonary effort is normal. No respiratory distress.  Skin:    General: Skin is warm and dry.     Findings: Laceration present.          Comments: 3cm laceration to right FA noted with fatty tissue exposed  Neurological:     General: No focal deficit present.     Mental Status: She is alert.  Psychiatric:        Behavior: Behavior is cooperative.      UC Treatments / Results  Labs (all labs ordered are listed, but only abnormal results are displayed) Labs Reviewed - No data to display  EKG   Radiology No results found.  Procedures Laceration Repair  Date/Time: 07/16/2023 4:49 PM  Performed by: Mekaila Tarnow, [place] N, FNP Authorized by: Tymeer Vaquera, [place] N, FNP   Consent:    Consent obtained:  Verbal   Consent given by:  Patient   Risks, benefits, and alternatives were discussed: yes     Risks discussed:  Infection, pain and poor cosmetic result   Alternatives discussed:  No treatment Universal protocol:    Procedure explained and questions answered to patient or proxy's satisfaction: yes     Patient identity confirmed:  Verbally with patient Anesthesia:    Anesthesia method:  Local infiltration   Local anesthetic:  Lidocaine 1% WITH epi Laceration details:    Location:  Shoulder/arm   Shoulder/arm location:  R lower arm   Length (cm):  3   Depth (mm):  5 Pre-procedure details:    Preparation:  Patient was prepped and draped in usual sterile fashion Exploration:    Hemostasis achieved with:  Direct pressure   Wound extent: areolar tissue  violated     Contaminated: no   Treatment:    Area cleansed with:  Soap and water (alcohol, wound cleaner, soap and water)   Amount of cleaning:  Standard   Debridement:  None Skin repair:     Repair method:  Sutures   Suture size:  5-0   Suture material:  Prolene   Suture technique:  Simple interrupted   Number of sutures:  4 Approximation:    Approximation:  Close Repair type:    Repair type:  Simple Post-procedure details:    Dressing:  Non-adherent dressing   Procedure completion:  Tolerated well, no immediate complications  (including critical care time)  Medications Ordered in UC Medications - No data to display  Initial Impression / Assessment and Plan / UC Course  I have reviewed the triage vital signs and the nursing notes.  Pertinent labs & imaging results that were available during my care of the patient were reviewed by me and considered in my medical decision making (see chart for details).  Vitals and triage reviewed, patient is hemodynamically stable. Laceration to right distal FA, no retained FB, able to visualize full extend of wound. Tdap updated recently with childbirth.  Wound cleansed, sutures placed, edges well-approximated, tolerated well.  See procedure note for further details.  Cut was clean, wound bed cleaned well, will withhold oral antibiotics at this time, advised topical.  Plan of care, follow-up care return precautions given, no questions at this time.     Final Clinical Impressions(s) / UC Diagnoses   Final diagnoses:  Laceration of right forearm, initial encounter     Discharge Instructions      We placed 4 sutures in your arm laceration. Keep the area clean and dry.  Do not get it wet for the next 24 hours.  Afterwards you can gently clean with an antibacterial soap such as Dial and pat dry.  You can also apply topical antibacterial ointment such as Neosporin.  Return to clinic in 7 days for suture removal.  Come back sooner if you develop any signs or symptoms of infection.    ED Prescriptions   None    PDMP not reviewed this encounter.   Jeray Shugart, [place] N, [place] 07/16/23 [phone]

## 2023-07-17 DIAGNOSIS — Z419 Encounter for procedure for purposes other than remedying health state, unspecified: Secondary | ICD-10-CM | POA: Diagnosis not present

## 2023-08-28 DIAGNOSIS — Z419 Encounter for procedure for purposes other than remedying health state, unspecified: Secondary | ICD-10-CM | POA: Diagnosis not present

## 2023-09-27 DIAGNOSIS — Z419 Encounter for procedure for purposes other than remedying health state, unspecified: Secondary | ICD-10-CM | POA: Diagnosis not present

## 2023-10-28 DIAGNOSIS — Z419 Encounter for procedure for purposes other than remedying health state, unspecified: Secondary | ICD-10-CM | POA: Diagnosis not present

## 2023-11-27 DIAGNOSIS — Z419 Encounter for procedure for purposes other than remedying health state, unspecified: Secondary | ICD-10-CM | POA: Diagnosis not present

## 2023-12-28 DIAGNOSIS — Z419 Encounter for procedure for purposes other than remedying health state, unspecified: Secondary | ICD-10-CM | POA: Diagnosis not present

## 2024-01-28 DIAGNOSIS — Z419 Encounter for procedure for purposes other than remedying health state, unspecified: Secondary | ICD-10-CM | POA: Diagnosis not present

## 2024-07-06 ENCOUNTER — Ambulatory Visit
